# Patient Record
Sex: Female | Born: 1955 | Race: White | Hispanic: No | Marital: Married | Smoking: Never smoker
Health system: Southern US, Community
[De-identification: ages and names within clinical notes are randomized; demographics above are authoritative.]

## PROBLEM LIST (undated history)

## (undated) DIAGNOSIS — M1990 Unspecified osteoarthritis, unspecified site: Secondary | ICD-10-CM

## (undated) DIAGNOSIS — F419 Anxiety disorder, unspecified: Secondary | ICD-10-CM

## (undated) DIAGNOSIS — Z923 Personal history of irradiation: Secondary | ICD-10-CM

## (undated) DIAGNOSIS — G43909 Migraine, unspecified, not intractable, without status migrainosus: Secondary | ICD-10-CM

## (undated) DIAGNOSIS — Z808 Family history of malignant neoplasm of other organs or systems: Secondary | ICD-10-CM

## (undated) DIAGNOSIS — J309 Allergic rhinitis, unspecified: Secondary | ICD-10-CM

## (undated) DIAGNOSIS — R569 Unspecified convulsions: Secondary | ICD-10-CM

## (undated) DIAGNOSIS — Z801 Family history of malignant neoplasm of trachea, bronchus and lung: Secondary | ICD-10-CM

## (undated) DIAGNOSIS — I10 Essential (primary) hypertension: Secondary | ICD-10-CM

## (undated) DIAGNOSIS — Z9221 Personal history of antineoplastic chemotherapy: Secondary | ICD-10-CM

## (undated) DIAGNOSIS — T7840XA Allergy, unspecified, initial encounter: Secondary | ICD-10-CM

## (undated) DIAGNOSIS — L659 Nonscarring hair loss, unspecified: Secondary | ICD-10-CM

## (undated) DIAGNOSIS — L409 Psoriasis, unspecified: Secondary | ICD-10-CM

## (undated) DIAGNOSIS — F329 Major depressive disorder, single episode, unspecified: Secondary | ICD-10-CM

## (undated) DIAGNOSIS — G40909 Epilepsy, unspecified, not intractable, without status epilepticus: Secondary | ICD-10-CM

## (undated) DIAGNOSIS — E559 Vitamin D deficiency, unspecified: Secondary | ICD-10-CM

## (undated) HISTORY — PX: TONSILLECTOMY: SUR1361

## (undated) HISTORY — PX: KNEE SURGERY: SHX244

## (undated) HISTORY — DX: Anxiety disorder, unspecified: F41.9

## (undated) HISTORY — DX: Allergic rhinitis, unspecified: J30.9

## (undated) HISTORY — DX: Family history of malignant neoplasm of trachea, bronchus and lung: Z80.1

## (undated) HISTORY — DX: Nonscarring hair loss, unspecified: L65.9

## (undated) HISTORY — DX: Epilepsy, unspecified, not intractable, without status epilepticus: G40.909

## (undated) HISTORY — DX: Essential (primary) hypertension: I10

## (undated) HISTORY — DX: Unspecified convulsions: R56.9

## (undated) HISTORY — PX: BREAST LUMPECTOMY: SHX2

## (undated) HISTORY — DX: Family history of malignant neoplasm of other organs or systems: Z80.8

## (undated) HISTORY — DX: Major depressive disorder, single episode, unspecified: F32.9

## (undated) HISTORY — DX: Vitamin D deficiency, unspecified: E55.9

## (undated) HISTORY — DX: Migraine, unspecified, not intractable, without status migrainosus: G43.909

## (undated) HISTORY — PX: TUBAL LIGATION: SHX77

## (undated) HISTORY — PX: CHOLECYSTECTOMY: SHX55

## (undated) HISTORY — DX: Psoriasis, unspecified: L40.9

## (undated) HISTORY — DX: Unspecified osteoarthritis, unspecified site: M19.90

## (undated) HISTORY — DX: Allergy, unspecified, initial encounter: T78.40XA

## (undated) HISTORY — PX: BREAST BIOPSY: SHX20

---

## 2004-01-22 ENCOUNTER — Ambulatory Visit: Payer: Self-pay | Admitting: Obstetrics and Gynecology

## 2004-01-25 ENCOUNTER — Ambulatory Visit: Payer: Self-pay | Admitting: Obstetrics and Gynecology

## 2004-02-12 ENCOUNTER — Ambulatory Visit: Payer: Self-pay | Admitting: General Surgery

## 2004-10-02 ENCOUNTER — Other Ambulatory Visit: Payer: Self-pay

## 2004-10-02 ENCOUNTER — Emergency Department: Payer: Self-pay | Admitting: Internal Medicine

## 2005-02-24 ENCOUNTER — Ambulatory Visit: Payer: Self-pay | Admitting: Obstetrics and Gynecology

## 2005-11-17 ENCOUNTER — Ambulatory Visit: Payer: Self-pay | Admitting: Family Medicine

## 2006-03-17 ENCOUNTER — Ambulatory Visit: Payer: Self-pay | Admitting: Family Medicine

## 2007-10-06 ENCOUNTER — Ambulatory Visit: Payer: Self-pay | Admitting: Obstetrics and Gynecology

## 2010-02-20 ENCOUNTER — Ambulatory Visit: Payer: Self-pay | Admitting: Obstetrics and Gynecology

## 2011-06-08 ENCOUNTER — Emergency Department: Payer: Self-pay | Admitting: Emergency Medicine

## 2011-06-08 LAB — COMPREHENSIVE METABOLIC PANEL
Alkaline Phosphatase: 87 U/L (ref 50–136)
Anion Gap: 12 (ref 7–16)
Chloride: 101 mmol/L (ref 98–107)
Co2: 29 mmol/L (ref 21–32)
Creatinine: 0.82 mg/dL (ref 0.60–1.30)
EGFR (African American): >60
EGFR (Non-African Amer.): >60
Glucose: 110 mg/dL — ABNORMAL HIGH (ref 65–99)
Osmolality: 284 (ref 275–301)
SGOT(AST): 23 U/L (ref 15–37)
SGPT (ALT): 33 U/L
Sodium: 142 mmol/L (ref 136–145)
Total Protein: 8.5 g/dL — ABNORMAL HIGH (ref 6.4–8.2)

## 2011-06-08 LAB — CBC
HCT: 42.1 % (ref 35.0–47.0)
HGB: 13.9 g/dL (ref 12.0–16.0)
MCH: 29.9 pg (ref 26.0–34.0)
MCV: 90 fL (ref 80–100)
Platelet: 264 10*3/uL (ref 150–440)
RBC: 4.66 10*6/uL (ref 3.80–5.20)
RDW: 13.6 % (ref 11.5–14.5)
WBC: 6.1 10*3/uL (ref 3.6–11.0)

## 2011-06-08 LAB — TROPONIN I: Troponin-I: <0.02 ng/mL

## 2011-06-19 ENCOUNTER — Ambulatory Visit: Payer: Self-pay | Admitting: Obstetrics and Gynecology

## 2012-07-08 ENCOUNTER — Telehealth: Payer: Self-pay | Admitting: Diagnostic Neuroimaging

## 2012-07-08 NOTE — Telephone Encounter (Signed)
Pt called and is asking for an sooner appt then July due to will run out of her lamictal before then.  Former Dr. Sandria Manly pt.  (seen last 06-2010)

## 2012-08-10 ENCOUNTER — Telehealth: Payer: Self-pay | Admitting: Neurology

## 2012-08-10 NOTE — Telephone Encounter (Signed)
Calling to reschedule patient for Dr. Terrace Arabia.  Patient is scheduled for 10/24/2012 - home phone recording states number has been d/connected

## 2012-08-11 ENCOUNTER — Encounter: Payer: Self-pay | Admitting: *Deleted

## 2012-09-15 ENCOUNTER — Telehealth: Payer: Self-pay | Admitting: Diagnostic Neuroimaging

## 2012-10-13 ENCOUNTER — Other Ambulatory Visit: Payer: Self-pay

## 2012-10-13 MED ORDER — LAMOTRIGINE ER 100 MG PO TB24: ORAL_TABLET | ORAL | Status: DC

## 2012-10-14 ENCOUNTER — Ambulatory Visit: Payer: Self-pay | Admitting: Diagnostic Neuroimaging

## 2012-10-21 ENCOUNTER — Telehealth: Payer: Self-pay | Admitting: Diagnostic Neuroimaging

## 2012-10-21 MED ORDER — LAMICTAL XR 100 MG PO TB24: ORAL_TABLET | ORAL | Status: DC

## 2012-10-21 NOTE — Telephone Encounter (Signed)
Anna Stewart's husband called about her medication Lamitical, they received the generic brand in the mail, and he said she should be receiving the brand name. Please send in a order of name brand medication to [facility],. Patient only has 4 days left of  Med. Please advise

## 2012-10-31 ENCOUNTER — Encounter: Payer: Self-pay | Admitting: Diagnostic Neuroimaging

## 2012-10-31 ENCOUNTER — Ambulatory Visit (INDEPENDENT_AMBULATORY_CARE_PROVIDER_SITE_OTHER): Payer: 59 | Admitting: Diagnostic Neuroimaging

## 2012-10-31 VITALS — BP 118/82 | HR 83 | Temp 97.9°F | Ht 68.0 in | Wt 182.0 lb

## 2012-10-31 DIAGNOSIS — G40109 Localization-related (focal) (partial) symptomatic epilepsy and epileptic syndromes with simple partial seizures, not intractable, without status epilepticus: Secondary | ICD-10-CM

## 2012-10-31 MED ORDER — LAMICTAL XR 100 MG PO TB24: 200.0000 mg | ORAL_TABLET | Freq: Two times a day (BID) | ORAL | Status: DC

## 2012-10-31 NOTE — Patient Instructions (Signed)
Change lamictal xr to 200mg  twice a day.

## 2012-10-31 NOTE — Progress Notes (Signed)
[facility]  PATIENT: Anna Stewart DOB: October 10, 1955  REFERRING CLINICIAN:  HISTORY FROM: patient and husband REASON FOR VISIT: follow up   HISTORICAL  CHIEF COMPLAINT:  Chief Complaint  Patient presents with  . Follow-up    Dr. Sandria Manly transfer  . Seizures    HISTORY OF PRESENT ILLNESS:   UPDATE 10/31/12: Had breakthrough sz in May 2014 (1), June 2014 (3) and July 2014 (1). Previous to this patient has had no seizures in the prior one year. Patient seizures are similar quality consisting of stomach uprising sensation, dj vu, inability to speak for 20 seconds. No significant postictal confusion with the recent seizures. 09/09/2012 seizure cluster may have been related to her menstrual cycle. In May 2014 episode may have been triggered by missed dose of medication.  Patient last seen by Dr. love in 2012. At one point she was on regular release Lamictal, then transition to Lamictal XR 200 mg twice a day. She had some numbness in her left chin and therefore was reduced to Lamictal XR 200 mg the morning and 100 mg daily.   PRIOR HPI (07/02/10, Dr. 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 There was no loss of consciousness. In her 30s she began having recurrent episodes of a sensation of something beginning in her abdomen, rolling up to her mid chest associated with dj vu. Some episodes are associated with confusion. On one occasion she was eating a meal with a friend who felt the patient was "speaking in tongue." She describes these as "here it comes".  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 She describes episodes of dj vu and difficulty speaking. The episodes all last less than one minute.  She was started on Keppra but developed elevated liver function tests and  a "bone  marrow problem".  She was placed on Tegretol but developed a rash. She was tried on phenobarbital. She was placed on Dilantin and built up on brand name medically necessary to twice daily but this causes sleepiness so she takes one in the morning and 3 at night. At times she has problems getting her words out and has slurred speech. Her seizures occur at least 4 times per year. it is not clear if she loses consciousness with any of these. She has auras of dj vu at least once a month. She has never had a generalized major motor seizure. 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  She says "I'm klutzy" and tends to fall. She's noted numbness in her feet beginning over 2 years ago. She falls going upstairs.She has pain in her feet and uses ice pads at night. She has thought this may be "plantar fasciitis". She indicated this 01/11/2009 at [facility] and was evaluated with CBC, BMP, hemoglobin A1c, TSH free T4, B12, and B6, but I cannot tell if it was ever drawn.Dr. Suzie Portela has documented elevated phenytoin levels of  25.2, 11/09/08 and 30.3, 08/21/09. Previous levels were 24 and 27 in 2007  and 23.5 in 2009.   5/6/2011she had a low vitamin D level of 16.9 with normal CBC and CMP except alkaline phosphatase 159. TSH was 3.45. She is independent in her activities of daily living and drives. 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 9/16/2011was normal homocystine, methylmalonic acid, anti-CCP antibodies. Bone density study 12/18/2009 revealed T -1.5 She started on Lamictal 12/13/2009 and was tapered off of Dilantin 05/2010. She has had 2 seizures over the last few weeks, occurring 3/7 and 3/16. A Lamictal level was 3.1  2/15/212. She is now on Lamictal XR 200 mg twice  daily.This weekend Friday she noted difficulty walking.  She saiid while holding her head up. She is noted wobbly gait. She's had double vision. She comes in today for evaluation.Her lamictal was held yesterday and this morning. She has noted improvement in the numbness in her feet since discontinuing Dilantin  REVIEW OF SYSTEMS: Full 14 system review of systems performed and notable only for seizure.  ALLERGIES: Allergies  Allergen Reactions  . Tegretol (Carbamazepine)     HOME MEDICATIONS: Outpatient Prescriptions Prior to Visit  Medication Sig Dispense Refill  . LAMICTAL XR 100 MG TB24 Two tabs po in am and one tab po in pm  270 tablet  0   No facility-administered medications prior to visit.    PAST MEDICAL HISTORY: Past Medical History  Diagnosis Date  . Hypertension     x 5years  . Seizure     x 17years  . Migraine     PAST SURGICAL HISTORY: Past Surgical History  Procedure Laterality Date  . Knee surgery    . Cesarean section    . Cholecystectomy      FAMILY HISTORY: Family History  Problem Relation Age of Onset  . Brain cancer Mother     tumor  . Diabetes Father   . Cancer Father   . Heart disease Father     SOCIAL HISTORY:  History   Social History  . Marital Status: Married    Spouse Name: Acupuncturist    Number of Children: 3  . Years of Education: College   Occupational History  .   Other    [employer]   Social History Main Topics  . Smoking status: Never Smoker   . Smokeless tobacco: Never Used  . Alcohol Use: No     Comment: Rarely- wine  . Drug Use: No  . Sexually Active: Not on file   Other Topics Concern  . Not on file   Social History Narrative   Patient lives at home with her family.   Caffeine Use: 1 cups of coffee daily     PHYSICAL EXAM  Filed Vitals:   10/31/12 1055  BP: 118/82  Pulse: 83  Temp: 97.9 F (36.6 C)  TempSrc: Oral  Height: 5\' 8"  (1.727 m)  Weight: 182 lb (82.555 kg)    Not recorded      Body mass index is 27.68 kg/(m^2).  GENERAL EXAM: Patient is in no distress  CARDIOVASCULAR: Regular rate and rhythm, no murmurs, no carotid bruits  NEUROLOGIC: MENTAL STATUS: awake, alert, language fluent, comprehension intact, naming intact CRANIAL NERVE: pupils equal and reactive to light, visual fields full to confrontation, extraocular muscles intact, no nystagmus, facial sensation and strength symmetric, uvula midline, shoulder shrug symmetric, tongue midline. MOTOR: normal bulk and tone, full strength in the BUE, BLE SENSORY: normal and symmetric to light touch COORDINATION: finger-nose-finger, fine finger movements normal REFLEXES: deep tendon reflexes present and symmetric GAIT/STATION: narrow based gait; able to walk tandem   DIAGNOSTIC DATA (LABS, IMAGING, TESTING) -  I reviewed patient records, labs, notes, testing and imaging myself where available.  No results found for this basename: WBC, HGB, HCT, MCV, PLT   No results found for this basename: na, k, cl, co2, glucose, bun, creatinine, calcium, prot, albumin, ast, alt, alkphos, bilitot, gfrnonaa, gfraa   No results found for this basename: CHOL, HDL, LDLCALC, LDLDIRECT, TRIG, CHOLHDL   No results found for this basename: HGBA1C   No results found for this basename: VITAMINB12   No results found for this basename: TSH      ASSESSMENT AND PLAN  57 y.o. year old female  has a past medical history of Hypertension; Seizure; and Migraine. here with seizure disorder. Breakthrough seizures in May -July 2014 noted. Will increase dosing.  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 incr Lamitcal XR (brand necessary) to 200mg  BID   Suanne Marker, MD 10/31/2012, 12:00 PM Certified in Neurology, Neurophysiology and Neuroimaging  [facility] [address] [place], [place] [address] [phone]

## 2012-11-18 ENCOUNTER — Ambulatory Visit: Payer: Self-pay | Admitting: Unknown Physician Specialty

## 2012-12-08 ENCOUNTER — Telehealth: Payer: Self-pay

## 2012-12-08 MED ORDER — LAMICTAL XR 200 MG PO TB24: 200.0000 mg | ORAL_TABLET | Freq: Two times a day (BID) | ORAL | Status: AC

## 2012-12-08 NOTE — Telephone Encounter (Signed)
[facility] sent [place] a letter saying they will not pay for 4 tablets of the Lamictal XR Daily.  They did, however, indicate they would pay for two 200mg  tabs daily.  They would like the Rx updated to one 200mg  tab bid, otherwise, the patient will have to pay out of pocket for meds.  Since it equals the same dose, I have updated the Rx and resent it to the pharmacy.

## 2012-12-19 ENCOUNTER — Telehealth: Payer: Self-pay | Admitting: Diagnostic Neuroimaging

## 2012-12-19 NOTE — Telephone Encounter (Signed)
Patient says she had a breakthrough sz on Sunday. She is unable to get Brand sz med (Lamictal) through pharmacy ([facility].) Would like Dr. Marjory Lies to advise.

## 2012-12-22 ENCOUNTER — Telehealth: Payer: Self-pay | Admitting: Diagnostic Neuroimaging

## 2012-12-22 NOTE — Telephone Encounter (Signed)
We sent a Rx to the pharmacy on 12/08/2012 for Brand Name Only.  I called the pharmacy.  Spoke with Carley Hammed. She said her records do reflect they have the Rx for BMN, but have not processed the order.  She did run the Rx through ins and said it will pay for Brand, however they will not ship meds until they speak to the patient.  She states they will contact the patient with the next 24 hours regarding shipment of this medication.  Once they verify all info required, meds will ship.

## 2013-01-06 NOTE — Telephone Encounter (Signed)
Setup revisit with Lynn. -VRP 

## 2013-01-09 NOTE — Telephone Encounter (Signed)
Lvm for patient to call me back to make a sooner appointment . With Dr. Marjory Lies NP Heide Guile.

## 2013-01-10 ENCOUNTER — Telehealth: Payer: Self-pay | Admitting: Nurse Practitioner

## 2013-01-10 ENCOUNTER — Telehealth: Payer: Self-pay | Admitting: Diagnostic Neuroimaging

## 2013-01-10 NOTE — Telephone Encounter (Signed)
Call pt to schedule a earlier appt. For her, left message to call back to schedule with lynn lam, NP

## 2013-01-12 NOTE — Telephone Encounter (Signed)
Returned call. No answer.  

## 2013-04-28 ENCOUNTER — Ambulatory Visit: Payer: 59 | Admitting: Diagnostic Neuroimaging

## 2013-06-27 ENCOUNTER — Ambulatory Visit: Payer: 59 | Admitting: Diagnostic Neuroimaging

## 2013-07-10 DIAGNOSIS — N83209 Unspecified ovarian cyst, unspecified side: Secondary | ICD-10-CM | POA: Insufficient documentation

## 2013-07-10 DIAGNOSIS — M722 Plantar fascial fibromatosis: Secondary | ICD-10-CM | POA: Insufficient documentation

## 2013-07-10 DIAGNOSIS — R569 Unspecified convulsions: Secondary | ICD-10-CM | POA: Insufficient documentation

## 2013-08-30 DIAGNOSIS — F32A Depression, unspecified: Secondary | ICD-10-CM | POA: Insufficient documentation

## 2013-08-30 DIAGNOSIS — E538 Deficiency of other specified B group vitamins: Secondary | ICD-10-CM | POA: Insufficient documentation

## 2013-08-30 DIAGNOSIS — F329 Major depressive disorder, single episode, unspecified: Secondary | ICD-10-CM | POA: Insufficient documentation

## 2014-02-19 ENCOUNTER — Ambulatory Visit: Payer: Self-pay | Admitting: Obstetrics and Gynecology

## 2014-02-19 LAB — HM MAMMOGRAPHY

## 2014-10-12 DIAGNOSIS — I10 Essential (primary) hypertension: Secondary | ICD-10-CM | POA: Insufficient documentation

## 2014-10-17 ENCOUNTER — Encounter: Payer: Self-pay | Admitting: Obstetrics and Gynecology

## 2015-05-14 ENCOUNTER — Ambulatory Visit (INDEPENDENT_AMBULATORY_CARE_PROVIDER_SITE_OTHER): Payer: 59 | Admitting: Obstetrics and Gynecology

## 2015-05-14 ENCOUNTER — Encounter: Payer: Self-pay | Admitting: Obstetrics and Gynecology

## 2015-05-14 VITALS — BP 107/71 | HR 84 | Ht 68.0 in | Wt 169.5 lb

## 2015-05-14 DIAGNOSIS — Z780 Asymptomatic menopausal state: Secondary | ICD-10-CM | POA: Insufficient documentation

## 2015-05-14 DIAGNOSIS — N952 Postmenopausal atrophic vaginitis: Secondary | ICD-10-CM | POA: Diagnosis not present

## 2015-05-14 DIAGNOSIS — G43909 Migraine, unspecified, not intractable, without status migrainosus: Secondary | ICD-10-CM | POA: Insufficient documentation

## 2015-05-14 DIAGNOSIS — Z01419 Encounter for gynecological examination (general) (routine) without abnormal findings: Secondary | ICD-10-CM | POA: Diagnosis not present

## 2015-05-14 DIAGNOSIS — Z1211 Encounter for screening for malignant neoplasm of colon: Secondary | ICD-10-CM

## 2015-05-14 DIAGNOSIS — Z1239 Encounter for other screening for malignant neoplasm of breast: Secondary | ICD-10-CM

## 2015-05-14 MED ORDER — ESTROGENS, CONJUGATED 0.625 MG/GM VA CREA: 0.2500 | TOPICAL_CREAM | Freq: Every day | VAGINAL | Status: DC

## 2015-05-14 NOTE — Patient Instructions (Signed)
1.  Pap smear is done today. 2.  Mammogram is ordered. 3.  Stool guaiac cards for colon cancer screening are given. 4.  Continue with screening laboratory testing with primary care. 5.  Resume Premarin cream intravaginally twice a week. 6.  Encourage healthy eating, exercise, maintaining weight, minimizing alcohol use to 1 drink a day or less. 7.  Encourage calcium with vitamin D supplementation. 8.  Return in 1 year

## 2015-05-14 NOTE — Progress Notes (Signed)
Patient ID: Anna Stewart, female   DOB: Feb 05, 1956, 60 y.o.   MRN: [id number] ANNUAL PREVENTATIVE CARE GYN  ENCOUNTER NOTE  Subjective:       Anna Stewart is a 60 y.o. [id number] female here for a routine annual gynecologic exam.  Current complaints: 1.  Mild vasomotor symptoms 2.  Mild arthritic changes in knees, hands and fingers. 3.  Vaginal dryness.    Gynecologic History No LMP recorded. Patient is postmenopausal. Contraception: post menopausal status Last Pap: 2014. Results were: normal Last mammogram: 2016. Results were: normal No HRT therapy Previously used Premarin cream intravaginal  Obstetric History OB History  Gravida Para Term Preterm AB SAB TAB Ectopic Multiple Living  3 3 3       3     # Outcome Date GA Lbr Len/2nd Weight Sex Delivery Anes PTL Lv  3 Term      CS-LTranv   Y  2 Term      CS-LTranv   Y  1 Term      CS-LTranv   Y      Past Medical History  Diagnosis Date  . Hypertension     x 5years  . Seizure ([place])     x 17years  . Migraine   . Hair loss   . Anxiety   . Depression   . Psoriasis   . Seizure disorder ([place])   . Vitamin D deficiency   . AR (allergic rhinitis)     Past Surgical History  Procedure Laterality Date  . Knee surgery    . Cesarean section    . Cholecystectomy    . Breast lumpectomy    . Knee surgery    . Tubal ligation    . Tonsillectomy      Current Outpatient Prescriptions on File Prior to Visit  Medication Sig Dispense Refill  . calcium carbonate (OS-CAL) 600 MG TABS Take 600 mg by mouth 2 (two) times daily with a meal.    . cholecalciferol (VITAMIN D) 1000 UNITS tablet Take 1,000 Units by mouth daily.    Marland Kitchen LAMICTAL XR 200 MG TB24 Take 1 tablet (200 mg total) by mouth 2 (two) times daily. Brand Medically Necessary 180 tablet 3  . lisinopril-hydrochlorothiazide (PRINZIDE,ZESTORETIC) 10-12.5 MG per tablet Take 1 tablet by mouth daily.     No current facility-administered medications on file prior to  visit.    Allergies  Allergen Reactions  . Keppra [Levetiracetam]   . Tegretol [Carbamazepine]     Social History   Social History  . Marital Status: Married    Spouse Name: Event organiser  . Number of Children: 3  . Years of Education: College   Occupational History  .   Other    [employer]   Social History Main Topics  . Smoking status: Never Smoker   . Smokeless tobacco: Never Used  . Alcohol Use: Yes     Comment: Rarely- wine  . Drug Use: No  . Sexual Activity: Yes    Birth Control/ Protection: Post-menopausal   Other Topics Concern  . Not on file   Social History Narrative   Patient lives at home with her family.   Caffeine Use: 1 cups of coffee daily    Family History  Problem Relation Age of Onset  . Brain cancer Mother     tumor  . Diabetes Father   . Cancer Father   . Heart disease Father   . Lung cancer Father     The  following portions of the patient's history were reviewed and updated as appropriate: allergies, current medications, past family history, past medical history, past social history, past surgical history and problem list.  Review of Systems ROS Review of Systems - General ROS: negative for - chills, fatigue, fever, hot flashes, night sweats, weight gain or weight loss Psychological ROS: negative for - anxiety, decreased libido, depression, mood swings, physical abuse or sexual abuse Ophthalmic ROS: negative for - blurry vision, eye pain or loss of vision ENT ROS: negative for - headaches, hearing change, visual changes or vocal changes Allergy and Immunology ROS: negative for - hives, itchy/watery eyes or seasonal allergies Hematological and Lymphatic ROS: negative for - bleeding problems, bruising, swollen lymph nodes or weight loss Endocrine ROS: negative for - galactorrhea, hair pattern changes, hot flashes, malaise/lethargy, mood swings, palpitations, polydipsia/polyuria, skin changes, temperature intolerance or unexpected  weight changes Breast ROS: negative for - new or changing breast lumps or nipple discharge Respiratory ROS: negative for - cough or shortness of breath Cardiovascular ROS: negative for - chest pain, irregular heartbeat, palpitations or shortness of breath Gastrointestinal ROS: no abdominal pain, change in bowel habits, or black or bloody stools Genito-Urinary ROS: no dysuria, trouble voiding, or hematuria Musculoskeletal ROS: negative for - joint pain or joint stiffness Neurological ROS: negative for - bowel and bladder control changes Dermatological ROS: negative for rash and skin lesion changes   Objective:   BP 107/71 mmHg  Pulse 84  Ht 5\' 8"  (1.727 m)  Wt 169 lb 8 oz (76.885 kg)  BMI 25.78 kg/m2 CONSTITUTIONAL: Well-developed, well-nourished female in no acute distress.  PSYCHIATRIC: Normal mood and affect. Normal behavior. Normal judgment and thought content. [place]: Alert and oriented to person, place, and time. Normal muscle tone coordination. No cranial nerve deficit noted. HENT:  Normocephalic, atraumatic, External right and left ear normal. Oropharynx is clear and moist EYES: Conjunctivae and EOM are normal. Pupils are equal, round, and reactive to light. No scleral icterus.  NECK: Normal range of motion, supple, no masses.  Normal thyroid.  SKIN: Skin is warm and dry. No rash noted. Not diaphoretic. No erythema. No pallor. CARDIOVASCULAR: Normal heart rate noted, regular rhythm, no murmur. RESPIRATORY: Clear to auscultation bilaterally. Effort and breath sounds normal, no problems with respiration noted. BREASTS: Symmetric in size. No masses, skin changes, nipple drainage, or lymphadenopathy. ABDOMEN: Soft, normal bowel sounds, no distention noted.  No tenderness, rebound or guarding.  BLADDER: Normal PELVIC:  External Genitalia: Normal  BUS: Normal  Vagina: mild to moderate atrophy  Cervix: Normal  Uterus: Normal; midplane, normal size and shape, mobile  Adnexa:  Normal; nonpalpable and nontender  RV: External Exam NormaI, No Rectal Masses and Normal Sphincter tone  MUSCULOSKELETAL: Normal range of motion. No tenderness.  No cyanosis, clubbing, or edema.  2+ distal pulses. LYMPHATIC: No Axillary, Supraclavicular, or Inguinal Adenopathy.    Assessment:   Annual gynecologic examination 60 y.o. Contraception: post menopausal status bmi-25 Mild vasomotor symptoms, not desiring therapy. Vaginal atrophy, symptomatic  Plan:  Pap: Pap Co Test Mammogram: Ordered Stool Guaiac Testing:  Ordered Labs: thru employer Routine preventative health maintenance measures emphasized: Exercise/Diet/Weight control, Tobacco Warnings and Alcohol/Substance use risks Premarin cream 1 g intravaginally twice a week Return to [place], MD  Brayton Mars, MD  Note: This dictation was prepared with Dragon dictation along with smaller phrase technology. Any transcriptional errors that result from this process are unintentional.

## 2015-05-22 LAB — PAP IG AND HPV HIGH-RISK
HPV, HIGH-RISK: NEGATIVE
PAP Smear Comment: 0

## 2015-05-25 LAB — FECAL OCCULT BLOOD, IMMUNOCHEMICAL: Fecal Occult Bld: NEGATIVE

## 2015-08-28 ENCOUNTER — Other Ambulatory Visit: Payer: Self-pay | Admitting: Student

## 2015-08-28 DIAGNOSIS — M5416 Radiculopathy, lumbar region: Secondary | ICD-10-CM

## 2015-09-17 ENCOUNTER — Ambulatory Visit: Payer: 59

## 2015-11-26 ENCOUNTER — Encounter: Payer: Self-pay | Admitting: Emergency Medicine

## 2015-11-26 ENCOUNTER — Emergency Department
Admission: EM | Admit: 2015-11-26 | Discharge: 2015-11-27 | Disposition: A | Discharge status: Home / Self Care | Payer: 59 | Attending: Emergency Medicine | Admitting: Emergency Medicine

## 2015-11-26 DIAGNOSIS — Y92009 Unspecified place in unspecified non-institutional (private) residence as the place of occurrence of the external cause: Secondary | ICD-10-CM | POA: Insufficient documentation

## 2015-11-26 DIAGNOSIS — I10 Essential (primary) hypertension: Secondary | ICD-10-CM | POA: Diagnosis not present

## 2015-11-26 DIAGNOSIS — Y999 Unspecified external cause status: Secondary | ICD-10-CM | POA: Insufficient documentation

## 2015-11-26 DIAGNOSIS — S91011A Laceration without foreign body, right ankle, initial encounter: Secondary | ICD-10-CM | POA: Diagnosis not present

## 2015-11-26 DIAGNOSIS — W231XXA Caught, crushed, jammed, or pinched between stationary objects, initial encounter: Secondary | ICD-10-CM | POA: Insufficient documentation

## 2015-11-26 DIAGNOSIS — Z5189 Encounter for other specified aftercare: Secondary | ICD-10-CM

## 2015-11-26 DIAGNOSIS — IMO0002 Reserved for concepts with insufficient information to code with codable children: Secondary | ICD-10-CM

## 2015-11-26 DIAGNOSIS — Y939 Activity, unspecified: Secondary | ICD-10-CM | POA: Insufficient documentation

## 2015-11-26 NOTE — ED Triage Notes (Addendum)
Pt presents to ED with laceration to her right ankle. Pt states the storm door in her home closed before her leg got through the opening and the corner of the door caught her ankle. Laceration noted. No bleeding at this time.

## 2015-11-27 MED ORDER — CEPHALEXIN 500 MG PO CAPS: 500.0000 mg | ORAL_CAPSULE | Freq: Three times a day (TID) | ORAL | 0 refills | Status: DC

## 2015-11-27 MED ORDER — CEPHALEXIN 500 MG PO CAPS
500.0000 mg | ORAL_CAPSULE | Freq: Once | ORAL | Status: AC
Start: 2015-11-27 — End: 2015-11-27
  Administered 2015-11-27: 500 mg via ORAL
  Filled 2015-11-27: qty 1

## 2015-11-27 NOTE — ED Provider Notes (Signed)
[facility] Emergency Department Provider Note   ____________________________________________   First MD Initiated Contact with Patient 11/26/15 2346     (approximate)  I have reviewed the triage vital signs and the nursing notes.   HISTORY  Chief Complaint Laceration    HPI Anna Stewart is a 60 y.o. female who presents to the ED from home with a chief complaint of right ankle laceration. Patient reports approximately 6 PM her ankle got caught in the bottom corner of the storm door. She presents with nonbleeding laceration. Tetanus is up-to-date. Denies fever, chills, chest pain, shortness of breath, abdominal pain, nausea, vomiting, diarrhea. Denies recent travel or trauma. Nothing makes her symptoms better or worse.   Past Medical History:  Diagnosis Date  . Anxiety   . AR (allergic rhinitis)   . Depression   . Hair loss   . Hypertension    x 5years  . Migraine   . Psoriasis   . Seizure ([place])    x 17years  . Seizure disorder ([place])   . Vitamin D deficiency     Patient Active Problem List   Diagnosis Date Noted  . Vaginal atrophy 05/14/2015  . Menopause 05/14/2015  . Headache, migraine 05/14/2015  . Benign essential HTN 10/12/2014  . B12 deficiency 08/30/2013  . Clinical depression 08/30/2013  . Seizure ([place]) 07/10/2013  . Dupuytren's contracture of foot 07/10/2013  . 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 BREAST LUMPECTOMY    . CESAREAN SECTION    . CHOLECYSTECTOMY    . KNEE SURGERY    . KNEE SURGERY    . TONSILLECTOMY    . TUBAL LIGATION      Prior to Admission medications   Medication Sig Start Date End Date Taking? Authorizing Provider  calcium carbonate (OS-CAL) 600 MG TABS Take 600 mg by mouth 2 (two) times daily with a meal.    Historical Provider, MD  cephALEXin (KEFLEX) 500 MG capsule Take 1 capsule (500 mg total) by mouth 3 (three) times daily.  11/27/15   Paulette Blanch, MD  cholecalciferol (VITAMIN D) 1000 UNITS tablet Take 1,000 Units by mouth daily.    Historical Provider, MD  conjugated estrogens (PREMARIN) vaginal cream Place [id number] Applicatorfuls vaginally daily. 1/2 gram twice weekly. 05/14/15   Alanda Slim Defrancesco, MD  fluticasone (FLONASE) 50 MCG/ACT nasal spray Place into the nose. 02/26/15 02/26/16  Historical Provider, MD  LAMICTAL XR 200 MG TB24 Take 1 tablet (200 mg total) by mouth 2 (two) times daily. Brand Medically Necessary 12/08/12   Penni Bombard, MD  lisinopril-hydrochlorothiazide (PRINZIDE,ZESTORETIC) 10-12.5 MG per tablet Take 1 tablet by mouth daily. 08/11/12   Historical Provider, MD  Multiple Vitamin (MULTI-VITAMINS) TABS Take by mouth.    Historical Provider, MD    Allergies Keppra [levetiracetam] and Tegretol [carbamazepine]  Family History  Problem Relation Age of Onset  . Brain cancer Mother     tumor  . Diabetes Father   . Cancer Father   . Heart disease Father   . Lung cancer Father     Social History Social History  Substance Use Topics  . Smoking status: Never Smoker  . Smokeless tobacco: Never Used  . Alcohol use Yes     Comment: Rarely- wine    Review of Systems  Constitutional: No fever/chills. Eyes: No visual changes. ENT: No sore throat. Cardiovascular: Denies chest pain. Respiratory: Denies shortness of breath. Gastrointestinal: No abdominal  pain.  No nausea, no vomiting.  No diarrhea.  No constipation. Genitourinary: Negative for dysuria. Musculoskeletal: Positive for right ankle laceration. Skin: Negative for rash. Neurological: Negative for headaches, focal weakness or numbness.  10-point ROS otherwise negative.  ____________________________________________   PHYSICAL EXAM:  VITAL SIGNS: ED Triage Vitals  Enc Vitals Group     BP 11/26/15 2051 133/77     Pulse Rate 11/26/15 2051 84     Resp 11/26/15 2051 20     Temp 11/26/15 2051 98.1 F (36.7 C)     Temp Source  11/26/15 2051 Oral     SpO2 11/26/15 2051 97 %     Weight 11/26/15 2051 166 lb (75.3 kg)     Height 11/26/15 2051 5\' 8"  (1.727 m)     Head Circumference --      Peak Flow --      Pain Score 11/26/15 2052 5     Pain Loc --      Pain Edu? --      Excl. in [place]? --     Constitutional: Alert and oriented. Well appearing and in no acute distress. Eyes: Conjunctivae are normal. PERRL. EOMI. Head: Atraumatic. Nose: No congestion/rhinnorhea. Mouth/Throat: Mucous membranes are moist.  Oropharynx non-erythematous. Neck: No stridor.   Cardiovascular: Normal rate, regular rhythm. Grossly normal heart sounds.  Good peripheral circulation. Respiratory: Normal respiratory effort.  No retractions. Lungs CTAB. Gastrointestinal: Soft and nontender. No distention. No abdominal bruits. No CVA tenderness. Musculoskeletal: Right lateral ankle with approximately 2 cm superficial avulsion type wound without active bleeding. Skin margins do not stretch and cannot be approximated. 2+ distal pulses. Full range of motion without pain. No tendon injury seen. Neurologic:  Normal speech and language. No gross focal neurologic deficits are appreciated. No gait instability. Skin:  Skin is warm, dry and intact. No rash noted. Psychiatric: Mood and affect are normal. Speech and behavior are normal.  ____________________________________________   LABS (all labs ordered are listed, but only abnormal results are displayed)  Labs Reviewed - No data to display ____________________________________________  EKG  None ____________________________________________  RADIOLOGY  None ____________________________________________   PROCEDURES  Procedure(s) performed: None  Procedures  Critical Care performed: No  ____________________________________________   INITIAL IMPRESSION / ASSESSMENT AND PLAN / ED COURSE  Pertinent labs & imaging results that were available during my care of the patient were reviewed by  me and considered in my medical decision making (see chart for details).  60 year old female who presents approximately 6 hours after laceration of her right ankle. Patient has a avulsion type wound which is not amenable to suture repair. Nursing to irrigate wound, cleanse and apply nonadherent dressing. Strict return precautions given. Patient and spouse verbalize understanding and agree with plan of care.  Clinical Course     ____________________________________________   FINAL CLINICAL IMPRESSION(S) / ED DIAGNOSES  Final diagnoses:  Laceration  Encounter for wound care      NEW MEDICATIONS STARTED DURING THIS VISIT:  New Prescriptions   CEPHALEXIN (KEFLEX) 500 MG CAPSULE    Take 1 capsule (500 mg total) by mouth 3 (three) times daily.     Note:  This document was prepared using Dragon voice recognition software and may include unintentional dictation errors.    Paulette Blanch, MD 11/27/15 [phone]

## 2015-11-27 NOTE — Discharge Instructions (Signed)
1. Take antibiotic as prescribed (Keflex 500 mg 3 times daily 7 days). 2. Keep wound clean and dry. 3. Return to the ER for worsening symptoms, increased redness/swelling, purulent discharge or other concerns.

## 2016-03-16 DIAGNOSIS — Z8659 Personal history of other mental and behavioral disorders: Secondary | ICD-10-CM | POA: Insufficient documentation

## 2016-03-16 DIAGNOSIS — M79672 Pain in left foot: Secondary | ICD-10-CM | POA: Insufficient documentation

## 2016-05-18 DIAGNOSIS — M25561 Pain in right knee: Secondary | ICD-10-CM | POA: Diagnosis not present

## 2016-05-18 DIAGNOSIS — M170 Bilateral primary osteoarthritis of knee: Secondary | ICD-10-CM | POA: Diagnosis not present

## 2016-05-18 DIAGNOSIS — M25562 Pain in left knee: Secondary | ICD-10-CM | POA: Diagnosis not present

## 2016-05-25 DIAGNOSIS — M25562 Pain in left knee: Secondary | ICD-10-CM | POA: Diagnosis not present

## 2016-05-25 DIAGNOSIS — M25561 Pain in right knee: Secondary | ICD-10-CM | POA: Diagnosis not present

## 2016-05-25 DIAGNOSIS — M170 Bilateral primary osteoarthritis of knee: Secondary | ICD-10-CM | POA: Diagnosis not present

## 2016-06-01 DIAGNOSIS — M25562 Pain in left knee: Secondary | ICD-10-CM | POA: Diagnosis not present

## 2016-06-01 DIAGNOSIS — M25561 Pain in right knee: Secondary | ICD-10-CM | POA: Diagnosis not present

## 2016-06-01 DIAGNOSIS — M170 Bilateral primary osteoarthritis of knee: Secondary | ICD-10-CM | POA: Diagnosis not present

## 2016-11-09 ENCOUNTER — Other Ambulatory Visit: Payer: Self-pay | Admitting: Family Medicine

## 2016-11-09 ENCOUNTER — Other Ambulatory Visit: Payer: Self-pay | Admitting: Obstetrics and Gynecology

## 2016-11-09 DIAGNOSIS — Z1231 Encounter for screening mammogram for malignant neoplasm of breast: Secondary | ICD-10-CM

## 2016-11-24 ENCOUNTER — Ambulatory Visit
Admission: RE | Admit: 2016-11-24 | Discharge: 2016-11-24 | Disposition: A | Discharge status: Home / Self Care | Payer: 59 | Source: Ambulatory Visit | Attending: Family Medicine | Admitting: Family Medicine

## 2016-11-24 DIAGNOSIS — Z1231 Encounter for screening mammogram for malignant neoplasm of breast: Secondary | ICD-10-CM | POA: Diagnosis not present

## 2017-01-19 DIAGNOSIS — M25512 Pain in left shoulder: Secondary | ICD-10-CM | POA: Diagnosis not present

## 2017-03-27 DIAGNOSIS — J019 Acute sinusitis, unspecified: Secondary | ICD-10-CM | POA: Diagnosis not present

## 2017-04-12 DIAGNOSIS — M21162 Varus deformity, not elsewhere classified, left knee: Secondary | ICD-10-CM | POA: Diagnosis not present

## 2017-04-12 DIAGNOSIS — M25561 Pain in right knee: Secondary | ICD-10-CM | POA: Diagnosis not present

## 2017-04-12 DIAGNOSIS — M21161 Varus deformity, not elsewhere classified, right knee: Secondary | ICD-10-CM | POA: Diagnosis not present

## 2017-04-12 DIAGNOSIS — M170 Bilateral primary osteoarthritis of knee: Secondary | ICD-10-CM | POA: Diagnosis not present

## 2017-04-12 DIAGNOSIS — M25562 Pain in left knee: Secondary | ICD-10-CM | POA: Diagnosis not present

## 2017-06-07 ENCOUNTER — Ambulatory Visit: Payer: 59 | Admitting: Cardiology

## 2017-06-29 ENCOUNTER — Ambulatory Visit: Payer: 59 | Admitting: Physician Assistant

## 2017-08-17 DIAGNOSIS — Z0000 Encounter for general adult medical examination without abnormal findings: Secondary | ICD-10-CM | POA: Diagnosis not present

## 2017-08-17 DIAGNOSIS — I10 Essential (primary) hypertension: Secondary | ICD-10-CM | POA: Diagnosis not present

## 2017-08-17 DIAGNOSIS — Z79899 Other long term (current) drug therapy: Secondary | ICD-10-CM | POA: Diagnosis not present

## 2017-08-23 DIAGNOSIS — M7651 Patellar tendinitis, right knee: Secondary | ICD-10-CM | POA: Diagnosis not present

## 2017-08-23 DIAGNOSIS — M170 Bilateral primary osteoarthritis of knee: Secondary | ICD-10-CM | POA: Diagnosis not present

## 2017-08-23 DIAGNOSIS — M25562 Pain in left knee: Secondary | ICD-10-CM | POA: Diagnosis not present

## 2017-08-23 DIAGNOSIS — M25561 Pain in right knee: Secondary | ICD-10-CM | POA: Diagnosis not present

## 2017-09-02 DIAGNOSIS — M170 Bilateral primary osteoarthritis of knee: Secondary | ICD-10-CM | POA: Diagnosis not present

## 2017-09-09 DIAGNOSIS — M170 Bilateral primary osteoarthritis of knee: Secondary | ICD-10-CM | POA: Diagnosis not present

## 2017-09-16 DIAGNOSIS — M170 Bilateral primary osteoarthritis of knee: Secondary | ICD-10-CM | POA: Diagnosis not present

## 2017-12-17 ENCOUNTER — Emergency Department
Admission: EM | Admit: 2017-12-17 | Discharge: 2017-12-17 | Disposition: A | Discharge status: Home / Self Care | Payer: 59 | Attending: Emergency Medicine | Admitting: Emergency Medicine

## 2017-12-17 ENCOUNTER — Other Ambulatory Visit: Payer: Self-pay

## 2017-12-17 ENCOUNTER — Emergency Department: Payer: 59

## 2017-12-17 ENCOUNTER — Encounter: Payer: Self-pay | Admitting: Emergency Medicine

## 2017-12-17 DIAGNOSIS — Z79899 Other long term (current) drug therapy: Secondary | ICD-10-CM | POA: Insufficient documentation

## 2017-12-17 DIAGNOSIS — F419 Anxiety disorder, unspecified: Secondary | ICD-10-CM | POA: Insufficient documentation

## 2017-12-17 DIAGNOSIS — R079 Chest pain, unspecified: Secondary | ICD-10-CM | POA: Insufficient documentation

## 2017-12-17 DIAGNOSIS — F329 Major depressive disorder, single episode, unspecified: Secondary | ICD-10-CM | POA: Insufficient documentation

## 2017-12-17 DIAGNOSIS — I10 Essential (primary) hypertension: Secondary | ICD-10-CM | POA: Diagnosis not present

## 2017-12-17 DIAGNOSIS — R0602 Shortness of breath: Secondary | ICD-10-CM | POA: Insufficient documentation

## 2017-12-17 DIAGNOSIS — Z9049 Acquired absence of other specified parts of digestive tract: Secondary | ICD-10-CM | POA: Diagnosis not present

## 2017-12-17 DIAGNOSIS — R0789 Other chest pain: Secondary | ICD-10-CM | POA: Diagnosis not present

## 2017-12-17 LAB — COMPREHENSIVE METABOLIC PANEL
ALT: 25 U/L (ref 0–44)
ANION GAP: 8 (ref 5–15)
AST: 27 U/L (ref 15–41)
Albumin: 4.4 g/dL (ref 3.5–5.0)
Alkaline Phosphatase: 79 U/L (ref 38–126)
BILIRUBIN TOTAL: 0.4 mg/dL (ref 0.3–1.2)
BUN: 14 mg/dL (ref 6–20)
CO2: 25 mmol/L (ref 22–32)
Calcium: 9.3 mg/dL (ref 8.9–10.3)
Chloride: 106 mmol/L (ref 98–111)
Creatinine, Ser: 0.76 mg/dL (ref 0.44–1.00)
GFR calc Af Amer: >60 mL/min (ref >60)
Glucose, Bld: 125 mg/dL — ABNORMAL HIGH (ref 70–99)
POTASSIUM: 3.5 mmol/L (ref 3.5–5.1)
Sodium: 139 mmol/L (ref 135–145)
TOTAL PROTEIN: 7.5 g/dL (ref 6.5–8.1)

## 2017-12-17 LAB — BRAIN NATRIURETIC PEPTIDE: B NATRIURETIC PEPTIDE 5: 40 pg/mL (ref 0.0–100.0)

## 2017-12-17 LAB — CBC
HCT: 38.6 % (ref 35.0–47.0)
Hemoglobin: 13.4 g/dL (ref 12.0–16.0)
MCH: 31.3 pg (ref 26.0–34.0)
MCHC: 34.6 g/dL (ref 32.0–36.0)
MCV: 90.4 fL (ref 80.0–100.0)
PLATELETS: 228 10*3/uL (ref 150–440)
RBC: 4.27 MIL/uL (ref 3.80–5.20)
RDW: 13.8 % (ref 11.5–14.5)
WBC: 5 10*3/uL (ref 3.6–11.0)

## 2017-12-17 LAB — TROPONIN I: Troponin I: <0.03 ng/mL (ref <0.03)

## 2017-12-17 MED ORDER — ASPIRIN 81 MG PO CHEW
324.0000 mg | CHEWABLE_TABLET | Freq: Once | ORAL | Status: AC
  Administered 2017-12-17: 324 mg via ORAL
  Filled 2017-12-17: qty 4

## 2017-12-17 NOTE — ED Provider Notes (Signed)
[facility] Emergency Department Provider Note  ____________________________________________   First MD Initiated Contact with Patient 12/17/17 1620     (approximate)  I have reviewed the triage vital signs and the nursing notes.   HISTORY  Chief Complaint Chest Pain    HPI Anna Stewart is a 62 y.o. female with medical history as listed below who presents for evaluation of acute onset chest discomfort today.  She reports that she is having some pain in the right side of her neck which then spread down into her upper right chest as a sensation of pressure.  She also felt like she was not able to take a deep breath.  She has never had these symptoms before.  They lasted at least 30 minutes before resolving on their own.  She feels much better now and will not state that the symptoms are completely gone but "mostly" resolved and she says that she feels fine at this time.  She had no sharp stabbing chest pain.  She has had no recent surgeries or immobilizations or long trips.  She has never had blood clots in her legs nor lungs.  She does not use exogenous estrogen.  She has had some mild bilateral lower extremity swelling over the last week that seems to be worse in the evenings and better during the day.  She has mild and well-controlled hypertension, no diabetes, no hypercholesterolemia, no smoking history.  She has no first-degree relatives that have had heart attacks.  She has had no nausea, vomiting, fever/chills, nor abdominal pain.  Past Medical History:  Diagnosis Date  . Anxiety   . AR (allergic rhinitis)   . Depression   . Hair loss   . Hypertension    x 5years  . Migraine   . Psoriasis   . Seizure ([place])    x 17years  . Seizure disorder ([place])   . Vitamin D deficiency     Patient Active Problem List   Diagnosis Date Noted  . Vaginal atrophy 05/14/2015  . Menopause 05/14/2015  . Headache, migraine 05/14/2015  . Benign essential HTN  10/12/2014  . B12 deficiency 08/30/2013  . Clinical depression 08/30/2013  . Seizure ([place]) 07/10/2013  . Dupuytren's contracture of foot 07/10/2013  . 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 BREAST LUMPECTOMY    . CESAREAN SECTION    . CHOLECYSTECTOMY    . KNEE SURGERY    . KNEE SURGERY    . TONSILLECTOMY    . TUBAL LIGATION      Prior to Admission medications   Medication Sig Start Date End Date Taking? Authorizing Provider  calcium carbonate (OS-CAL) 600 MG TABS Take 600 mg by mouth 2 (two) times daily with a meal.   Yes [provider]  cholecalciferol (VITAMIN D) 1000 UNITS tablet Take 1,000 Units by mouth daily.   Yes [provider]  fluticasone (FLONASE) 50 MCG/ACT nasal spray Place 2 sprays into both nostrils daily. 12/16/17  Yes [provider]  LAMICTAL XR 200 MG TB24 Take 1 tablet (200 mg total) by mouth 2 (two) times daily. Brand Medically Necessary 12/08/12  Yes Penumalli, Earlean Polka, MD  lisinopril-hydrochlorothiazide (PRINZIDE,ZESTORETIC) 10-12.5 MG per tablet Take 0.5 tablets by mouth daily.  08/11/12  Yes [provider]  Multiple Vitamin (MULTI-VITAMINS) TABS Take 1 tablet by mouth daily.    Yes [provider]  cephALEXin (KEFLEX) 500 MG capsule Take 1 capsule (  500 mg total) by mouth 3 (three) times daily. Patient not taking: Reported on 12/17/2017 11/27/15   Paulette Blanch, MD  conjugated estrogens (PREMARIN) vaginal cream Place 6.31 Applicatorfuls vaginally daily. 1/2 gram twice weekly. Patient not taking: Reported on 12/17/2017 05/14/15   Defrancesco, Alanda Slim, MD    Allergies Keppra [levetiracetam] and Tegretol [carbamazepine]  Family History  Problem Relation Age of Onset  . Brain cancer Mother        tumor  . Diabetes Father   . Cancer Father   . Heart disease Father   . Lung cancer Father     Social History Social History   Tobacco Use  . Smoking  status: Never Smoker  . Smokeless tobacco: Never Used  Substance Use Topics  . Alcohol use: Yes    Comment: Rarely- wine  . Drug use: No    Review of Systems Constitutional: No fever/chills Eyes: No visual changes. ENT: No sore throat. Cardiovascular: Chest pressure as described above Respiratory: Shortness of breath associated with chest pressure as described above Gastrointestinal: No abdominal pain.  No nausea, no vomiting.  No diarrhea.  No constipation. Genitourinary: Negative for dysuria. Musculoskeletal: Negative for neck pain.  Negative for back pain. Integumentary: Negative for rash. Neurological: Negative for headaches, focal weakness or numbness.   ____________________________________________   PHYSICAL EXAM:  VITAL SIGNS: ED Triage Vitals  Enc Vitals Group     BP 12/17/17 1449 111/68     Pulse Rate 12/17/17 1449 90     Resp 12/17/17 1449 20     Temp 12/17/17 1449 98.3 F (36.8 C)     Temp Source 12/17/17 1449 Oral     SpO2 12/17/17 1449 95 %     Weight 12/17/17 1450 83.9 kg (185 lb)     Height 12/17/17 1450 1.727 m (5\' 8" )     Head Circumference --      Peak Flow --      Pain Score 12/17/17 1450 8     Pain Loc --      Pain Edu? --      Excl. in [place]? --     Constitutional: Alert and oriented. Well appearing and in no acute distress. Eyes: Conjunctivae are normal.  Head: Atraumatic. Nose: No congestion/rhinnorhea. Mouth/Throat: Mucous membranes are moist. Neck: No stridor.  No meningeal signs.   Cardiovascular: Normal rate, regular rhythm. Good peripheral circulation. Grossly normal heart sounds. Respiratory: Normal respiratory effort.  No retractions. Lungs CTAB. Gastrointestinal: Soft and nontender. No distention.  Musculoskeletal: No lower extremity tenderness nor edema. No gross deformities of extremities. Neurologic:  Normal speech and language. No gross focal neurologic deficits are appreciated.  Skin:  Skin is warm, dry and intact. No rash  noted. Psychiatric: Mood and affect are normal. Speech and behavior are normal.  ____________________________________________   LABS (all labs ordered are listed, but only abnormal results are displayed)  Labs Reviewed  COMPREHENSIVE METABOLIC PANEL - Abnormal; Notable for the following components:      Result Value   Glucose, Bld 125 (*)    All other components within normal limits  CBC  TROPONIN I  BRAIN NATRIURETIC PEPTIDE  TROPONIN I  POC URINE PREG, ED   ____________________________________________  EKG  ED ECG REPORT I, Hinda Kehr, the attending physician, personally viewed and interpreted this ECG.  Date: 12/17/2017 EKG Time: 14: 41 Rate: 92 Rhythm: normal sinus rhythm QRS Axis: normal Intervals: normal ST/T Wave abnormalities: normal Narrative Interpretation: no evidence of acute ischemia  ____________________________________________  RADIOLOGY Ursula Alert, personally viewed and evaluated these images (plain radiographs) as part of my medical decision making, as well as reviewing the written report by the radiologist.  ED MD interpretation: No evidence of acute abnormalities on chest x-ray  Official radiology report(s): Dg Chest 2 View  Result Date: 12/17/2017 CLINICAL DATA:  Right chest pain. EXAM: CHEST - 2 VIEW COMPARISON:  06/08/2011 FINDINGS: Coarse lung markings particularly in the lower chest. No focal airspace disease or pulmonary edema. Heart and mediastinum are within normal limits. Trachea is midline. Surgical clips in the right upper abdomen. No large effusions. Bone structures are unremarkable. Negative for a pneumothorax. IMPRESSION: Evidence for chronic lung changes without acute findings. Electronically Signed   By: Markus Daft M.D.   On: 12/17/2017 15:19    ____________________________________________   PROCEDURES  Critical Care performed: No   Procedure(s) performed:    Procedures   ____________________________________________   INITIAL IMPRESSION / ASSESSMENT AND PLAN / ED COURSE  As part of my medical decision making, I reviewed the following data within the [place] notes reviewed and incorporated, Labs reviewed , EKG interpreted  and Radiograph reviewed     Differential diagnosis includes, but is not limited to, ACS, aortic dissection, pulmonary embolism, cardiac tamponade, pneumothorax, pneumonia, pericarditis, myocarditis, GI-related causes including esophagitis/gastritis, and musculoskeletal chest wall pain.    The patient is calm and appropriate, and reporting no symptoms at this time.  Vital signs are all within normal limits.  Chest x-ray shows no acute findings and EKG is normal with no evidence of ischemia.  Troponin is negative, conference of metabolic panel, BNP, and CBC are all within normal limits.  She is having no abdominal pain or discomfort and no nausea or vomiting.  She is low risk for ACS based on HEART score but I am going to check a second troponin because I think this may be anginal discomfort.  She has a Wells score of 0 for PE and I do not think it is necessary to further investigate this possibility.  She is comfortable with the plan for checking a second troponin following up as an outpatient with cardiology for possible stress test.  I am giving her a full dose aspirin in the ED and encouraged her to tell me if she has a repeat of her symptoms.  She agrees with the plan.  Clinical Course as of Dec 18 2110  Fri Dec 17, 2017  1102 Second troponin is negative and patient is asymptomatic.  She is comfortable with the plan for discharge and outpatient follow-up.  I reiterated my return precautions.    [CF]    Clinical Course User Index [CF] Hinda Kehr, MD    ____________________________________________  FINAL CLINICAL IMPRESSION(S) / ED DIAGNOSES  Final diagnoses:  Chest pain, unspecified  type     MEDICATIONS GIVEN DURING THIS VISIT:  Medications  aspirin chewable tablet 324 mg (324 mg Oral Given 12/17/17 1714)     ED Discharge Orders    None       Note:  This document was prepared using Dragon voice recognition software and may include unintentional dictation errors.    Hinda Kehr, MD 12/17/17 2112

## 2017-12-17 NOTE — ED Triage Notes (Signed)
States this am noted some throat discomfort, then noticed upper chest pressure and sensation of not being able to take a deep breath. States has noted bilateral pedal and lower leg edema x 1 week.

## 2017-12-17 NOTE — Discharge Instructions (Addendum)

## 2017-12-21 DIAGNOSIS — I10 Essential (primary) hypertension: Secondary | ICD-10-CM | POA: Diagnosis not present

## 2017-12-21 DIAGNOSIS — R079 Chest pain, unspecified: Secondary | ICD-10-CM | POA: Diagnosis not present

## 2017-12-21 DIAGNOSIS — R9431 Abnormal electrocardiogram [ECG] [EKG]: Secondary | ICD-10-CM | POA: Diagnosis not present

## 2017-12-23 DIAGNOSIS — R079 Chest pain, unspecified: Secondary | ICD-10-CM | POA: Diagnosis not present

## 2017-12-23 DIAGNOSIS — E782 Mixed hyperlipidemia: Secondary | ICD-10-CM | POA: Diagnosis not present

## 2017-12-23 DIAGNOSIS — I10 Essential (primary) hypertension: Secondary | ICD-10-CM | POA: Diagnosis not present

## 2017-12-24 DIAGNOSIS — R079 Chest pain, unspecified: Secondary | ICD-10-CM | POA: Diagnosis not present

## 2017-12-27 DIAGNOSIS — R072 Precordial pain: Secondary | ICD-10-CM | POA: Diagnosis not present

## 2017-12-27 DIAGNOSIS — R0602 Shortness of breath: Secondary | ICD-10-CM | POA: Diagnosis not present

## 2018-01-17 DIAGNOSIS — R079 Chest pain, unspecified: Secondary | ICD-10-CM | POA: Diagnosis not present

## 2018-01-17 DIAGNOSIS — I2510 Atherosclerotic heart disease of native coronary artery without angina pectoris: Secondary | ICD-10-CM | POA: Diagnosis not present

## 2018-01-17 DIAGNOSIS — K219 Gastro-esophageal reflux disease without esophagitis: Secondary | ICD-10-CM | POA: Diagnosis not present

## 2018-01-21 ENCOUNTER — Other Ambulatory Visit: Payer: Self-pay | Admitting: Family Medicine

## 2018-01-21 DIAGNOSIS — Z1231 Encounter for screening mammogram for malignant neoplasm of breast: Secondary | ICD-10-CM

## 2018-02-22 ENCOUNTER — Ambulatory Visit
Admission: RE | Admit: 2018-02-22 | Discharge: 2018-02-22 | Disposition: A | Discharge status: Home / Self Care | Payer: 59 | Source: Ambulatory Visit | Attending: Family Medicine | Admitting: Family Medicine

## 2018-02-22 DIAGNOSIS — Z1231 Encounter for screening mammogram for malignant neoplasm of breast: Secondary | ICD-10-CM | POA: Diagnosis not present

## 2018-03-01 DIAGNOSIS — R05 Cough: Secondary | ICD-10-CM | POA: Diagnosis not present

## 2018-03-01 DIAGNOSIS — J0100 Acute maxillary sinusitis, unspecified: Secondary | ICD-10-CM | POA: Diagnosis not present

## 2018-03-08 DIAGNOSIS — I10 Essential (primary) hypertension: Secondary | ICD-10-CM | POA: Diagnosis not present

## 2018-03-08 DIAGNOSIS — R0602 Shortness of breath: Secondary | ICD-10-CM | POA: Diagnosis not present

## 2018-03-08 DIAGNOSIS — K210 Gastro-esophageal reflux disease with esophagitis: Secondary | ICD-10-CM | POA: Diagnosis not present

## 2018-03-22 DIAGNOSIS — G40909 Epilepsy, unspecified, not intractable, without status epilepticus: Secondary | ICD-10-CM | POA: Diagnosis not present

## 2018-03-22 DIAGNOSIS — M79671 Pain in right foot: Secondary | ICD-10-CM | POA: Diagnosis not present

## 2018-03-24 DIAGNOSIS — M79671 Pain in right foot: Secondary | ICD-10-CM | POA: Insufficient documentation

## 2018-03-24 DIAGNOSIS — M79672 Pain in left foot: Secondary | ICD-10-CM | POA: Insufficient documentation

## 2018-07-26 DIAGNOSIS — I2510 Atherosclerotic heart disease of native coronary artery without angina pectoris: Secondary | ICD-10-CM | POA: Diagnosis not present

## 2018-07-26 DIAGNOSIS — I10 Essential (primary) hypertension: Secondary | ICD-10-CM | POA: Diagnosis not present

## 2018-07-26 DIAGNOSIS — R0602 Shortness of breath: Secondary | ICD-10-CM | POA: Diagnosis not present

## 2018-07-29 DIAGNOSIS — R0602 Shortness of breath: Secondary | ICD-10-CM | POA: Diagnosis not present

## 2018-07-29 DIAGNOSIS — I2510 Atherosclerotic heart disease of native coronary artery without angina pectoris: Secondary | ICD-10-CM | POA: Diagnosis not present

## 2018-07-29 DIAGNOSIS — I10 Essential (primary) hypertension: Secondary | ICD-10-CM | POA: Diagnosis not present

## 2019-05-18 DIAGNOSIS — Z79899 Other long term (current) drug therapy: Secondary | ICD-10-CM | POA: Insufficient documentation

## 2019-05-18 DIAGNOSIS — E559 Vitamin D deficiency, unspecified: Secondary | ICD-10-CM | POA: Insufficient documentation

## 2020-01-22 ENCOUNTER — Other Ambulatory Visit: Payer: Self-pay | Admitting: Family Medicine

## 2020-01-22 DIAGNOSIS — Z1231 Encounter for screening mammogram for malignant neoplasm of breast: Secondary | ICD-10-CM

## 2020-04-25 ENCOUNTER — Other Ambulatory Visit: Payer: Self-pay

## 2020-04-25 ENCOUNTER — Ambulatory Visit
Admission: RE | Admit: 2020-04-25 | Discharge: 2020-04-25 | Disposition: A | Discharge status: Home / Self Care | Payer: 59 | Source: Ambulatory Visit | Attending: Family Medicine | Admitting: Family Medicine

## 2020-04-25 DIAGNOSIS — Z1231 Encounter for screening mammogram for malignant neoplasm of breast: Secondary | ICD-10-CM | POA: Insufficient documentation

## 2020-05-01 ENCOUNTER — Other Ambulatory Visit: Payer: Self-pay | Admitting: Family Medicine

## 2020-05-01 DIAGNOSIS — N6320 Unspecified lump in the left breast, unspecified quadrant: Secondary | ICD-10-CM

## 2020-05-01 DIAGNOSIS — R928 Other abnormal and inconclusive findings on diagnostic imaging of breast: Secondary | ICD-10-CM

## 2020-05-07 ENCOUNTER — Telehealth: Payer: Self-pay | Admitting: *Deleted

## 2020-05-07 NOTE — Telephone Encounter (Signed)
CALLED PT ON 1/27 TO [facility] - NO RESPONSE, CALLED PT AGAIN ON 05/07/20 - PT DID ANSWER BUT STATED SHE WAS WAITING TO SPEAK TO HER DOCTOR 1ST BEFORE SHE SCHEDULED HER AV / MAMMO - PT WCB TO [facility]

## 2020-05-16 DIAGNOSIS — M1990 Unspecified osteoarthritis, unspecified site: Secondary | ICD-10-CM | POA: Insufficient documentation

## 2020-05-20 ENCOUNTER — Ambulatory Visit
Admission: RE | Admit: 2020-05-20 | Discharge: 2020-05-20 | Disposition: A | Discharge status: Home / Self Care | Payer: 59 | Source: Ambulatory Visit | Attending: Family Medicine | Admitting: Family Medicine

## 2020-05-20 ENCOUNTER — Other Ambulatory Visit: Payer: Self-pay

## 2020-05-20 DIAGNOSIS — N6320 Unspecified lump in the left breast, unspecified quadrant: Secondary | ICD-10-CM

## 2020-05-20 DIAGNOSIS — R928 Other abnormal and inconclusive findings on diagnostic imaging of breast: Secondary | ICD-10-CM | POA: Insufficient documentation

## 2020-05-22 ENCOUNTER — Other Ambulatory Visit: Payer: Self-pay | Admitting: Family Medicine

## 2020-05-22 DIAGNOSIS — N6320 Unspecified lump in the left breast, unspecified quadrant: Secondary | ICD-10-CM

## 2020-05-22 DIAGNOSIS — R928 Other abnormal and inconclusive findings on diagnostic imaging of breast: Secondary | ICD-10-CM

## 2020-05-30 ENCOUNTER — Ambulatory Visit
Admission: RE | Admit: 2020-05-30 | Discharge: 2020-05-30 | Disposition: A | Discharge status: Home / Self Care | Payer: 59 | Source: Ambulatory Visit | Attending: Family Medicine | Admitting: Family Medicine

## 2020-05-30 ENCOUNTER — Other Ambulatory Visit: Payer: Self-pay

## 2020-05-30 DIAGNOSIS — R928 Other abnormal and inconclusive findings on diagnostic imaging of breast: Secondary | ICD-10-CM | POA: Diagnosis present

## 2020-05-30 DIAGNOSIS — N6320 Unspecified lump in the left breast, unspecified quadrant: Secondary | ICD-10-CM | POA: Diagnosis present

## 2020-05-30 HISTORY — PX: BREAST BIOPSY: SHX20

## 2020-06-04 DIAGNOSIS — C801 Malignant (primary) neoplasm, unspecified: Secondary | ICD-10-CM

## 2020-06-04 HISTORY — DX: Malignant (primary) neoplasm, unspecified: C80.1

## 2020-06-07 ENCOUNTER — Other Ambulatory Visit: Payer: Self-pay | Admitting: Pathology

## 2020-06-07 LAB — SURGICAL PATHOLOGY

## 2020-06-11 ENCOUNTER — Telehealth: Payer: Self-pay | Admitting: Hematology and Oncology

## 2020-06-11 NOTE — Telephone Encounter (Signed)
Received an urgent referral Drom Dr. Donne Hazel for breast cancer. Ms. Bove has been cld and scheduled to see Dr. Lindi Adie on 3/15 at 4:15pm. Pt aware to arrive 20 minutes early.

## 2020-06-13 NOTE — Progress Notes (Signed)
Introduced navigation to patient and her husband.  Patient scheduled to see Dr. Janese Banks per Dr. Cristal Generous referral, on 06/18/20.  Plan to accompany to appointment.

## 2020-06-18 ENCOUNTER — Other Ambulatory Visit: Payer: Self-pay

## 2020-06-18 ENCOUNTER — Inpatient Hospital Stay: Payer: 59

## 2020-06-18 ENCOUNTER — Ambulatory Visit: Payer: 59 | Attending: General Surgery | Admitting: Physical Therapy

## 2020-06-18 ENCOUNTER — Encounter: Payer: Self-pay | Admitting: Oncology

## 2020-06-18 ENCOUNTER — Inpatient Hospital Stay: Payer: 59 | Attending: Oncology | Admitting: Oncology

## 2020-06-18 ENCOUNTER — Ambulatory Visit: Payer: 59 | Admitting: Hematology and Oncology

## 2020-06-18 ENCOUNTER — Encounter: Payer: Self-pay | Admitting: Physical Therapy

## 2020-06-18 VITALS — BP 119/61 | HR 83 | Temp 97.3°F | Resp 16 | Ht 68.0 in | Wt 188.0 lb

## 2020-06-18 DIAGNOSIS — R293 Abnormal posture: Secondary | ICD-10-CM | POA: Insufficient documentation

## 2020-06-18 DIAGNOSIS — Z808 Family history of malignant neoplasm of other organs or systems: Secondary | ICD-10-CM | POA: Diagnosis not present

## 2020-06-18 DIAGNOSIS — C50912 Malignant neoplasm of unspecified site of left female breast: Secondary | ICD-10-CM

## 2020-06-18 DIAGNOSIS — Z171 Estrogen receptor negative status [ER-]: Secondary | ICD-10-CM | POA: Insufficient documentation

## 2020-06-18 DIAGNOSIS — Z79899 Other long term (current) drug therapy: Secondary | ICD-10-CM | POA: Insufficient documentation

## 2020-06-18 DIAGNOSIS — I10 Essential (primary) hypertension: Secondary | ICD-10-CM | POA: Diagnosis not present

## 2020-06-18 DIAGNOSIS — Z801 Family history of malignant neoplasm of trachea, bronchus and lung: Secondary | ICD-10-CM | POA: Diagnosis not present

## 2020-06-18 DIAGNOSIS — Z8249 Family history of ischemic heart disease and other diseases of the circulatory system: Secondary | ICD-10-CM | POA: Insufficient documentation

## 2020-06-18 DIAGNOSIS — Z7189 Other specified counseling: Secondary | ICD-10-CM | POA: Diagnosis not present

## 2020-06-18 DIAGNOSIS — C50512 Malignant neoplasm of lower-outer quadrant of left female breast: Secondary | ICD-10-CM | POA: Insufficient documentation

## 2020-06-18 DIAGNOSIS — Z833 Family history of diabetes mellitus: Secondary | ICD-10-CM | POA: Diagnosis not present

## 2020-06-18 DIAGNOSIS — G40909 Epilepsy, unspecified, not intractable, without status epilepticus: Secondary | ICD-10-CM | POA: Insufficient documentation

## 2020-06-18 NOTE — Therapy (Signed)
[place], [place], [address] Phone: [phone]   Fax:  [phone]  Physical Therapy Evaluation  Patient Details  Name: Anna Stewart MRN: [id number] Date of Birth: 1955-07-16 Referring Provider (PT): Donne Hazel   Encounter Date: 06/18/2020   PT End of Session - 06/18/20 1548    Visit Number 1    Number of Visits 2    Date for PT Re-Evaluation 07/30/20    PT Start Time 1503    PT Stop Time 1547    PT Time Calculation (min) 44 min    Activity Tolerance Patient tolerated treatment well    Behavior During Therapy [facility] for tasks assessed/performed           Past Medical History:  Diagnosis Date  . Allergy   . AR (allergic rhinitis)   . Arthritis   . Hair loss   . Hypertension    x 5years  . Migraine   . Psoriasis   . Seizure ([place])    x 17years  . Seizure disorder ([place])   . Vitamin D deficiency     Past Surgical History:  Procedure Laterality Date  . BREAST BIOPSY Right    benign ? date-before 2006  . BREAST BIOPSY Left 05/30/2020   [place] bx, coil marker, path pending  . CESAREAN SECTION    . CHOLECYSTECTOMY    . KNEE SURGERY    . TONSILLECTOMY    . TUBAL LIGATION      There were no vitals filed for this visit.    Subjective Assessment - 06/18/20 1507    Subjective I am sad. It is triple negative which is harder to deal with. I have to have chemo after surgery.    Pertinent History L breast cancer triple negative, plan is to have a L lumpectomy and SLNB, then chemo, then radiation,    Patient Stated Goals to gain info from provider    Currently in Pain? No/denies    Pain Score 0-No pain              [facility] PT Assessment - 06/18/20 0001      Assessment   Medical Diagnosis left breast cancer    Referring Provider (PT) Donne Hazel    Onset Date/Surgical Date 05/20/20    Hand Dominance Right    Prior Therapy none      Precautions   Precautions Other (comment)     Precaution Comments active cancer      Restrictions   Weight Bearing Restrictions No      Balance Screen   Has the patient fallen in the past 6 months Yes    How many times? slipped on bottom 2 steps and broke ribs 3 months    Has the patient had a decrease in activity level because of a fear of falling?  No    Is the patient reluctant to leave their home because of a fear of falling?  No      Home Environment   Living Environment Private residence    Living Arrangements Spouse/significant other;Children   Anna Stewart 57   Available Help at Discharge Family    Type of [place]      Prior Function   Level of [place] Retired    Leisure have not exercised since the winter started      Cognition   Overall Cognitive Status Within Functional Limits for tasks assessed      Posture/Postural Control  Posture/Postural Control Postural limitations    Postural Limitations Rounded Shoulders;Forward head      ROM / Strength   AROM / PROM / Strength AROM      AROM   Right Shoulder Extension 70 Degrees    Right Shoulder Flexion 159 Degrees    Right Shoulder ABduction 177 Degrees    Right Shoulder Internal Rotation 49 Degrees    Right Shoulder External Rotation 90 Degrees    Left Shoulder Extension 65 Degrees    Left Shoulder Flexion 165 Degrees    Left Shoulder ABduction 178 Degrees    Left Shoulder Internal Rotation 62 Degrees    Left Shoulder External Rotation 90 Degrees             LYMPHEDEMA/ONCOLOGY QUESTIONNAIRE - 06/18/20 0001      Type   Cancer Type left breast cancer      Lymphedema Assessments   Lymphedema Assessments Upper extremities      Right Upper Extremity Lymphedema   15 cm Proximal to Olecranon Process 31.2 cm    Olecranon Process 26.8 cm    15 cm Proximal to Ulnar Styloid Process 25.4 cm    Just Proximal to Ulnar Styloid Process 14.6 cm    Across Hand at [employer] 18.1 cm    At [place] of 2nd Digit 6.5 cm      Left  Upper Extremity Lymphedema   15 cm Proximal to Olecranon Process 31.8 cm    Olecranon Process 28 cm    15 cm Proximal to Ulnar Styloid Process 25.8 cm    Just Proximal to Ulnar Styloid Process 15.5 cm    Across Hand at [employer] 18.5 cm    At [place] of 2nd Digit 6 cm           L-DEX FLOWSHEETS - 06/18/20 1500      L-DEX LYMPHEDEMA SCREENING   Measurement Type Unilateral    L-DEX MEASUREMENT EXTREMITY Upper Extremity    POSITION  Standing    DOMINANT SIDE Right    At Risk Side Left    BASELINE SCORE (UNILATERAL) 4.4           The patient was assessed using the L-Dex machine today to produce a lymphedema index baseline score. The patient will be reassessed on a regular basis (typically every 3 months) to obtain new L-Dex scores. If the score is > 6.5 points away from his/her baseline score indicating onset of subclinical lymphedema, it will be recommended to wear a compression garment for 4 weeks, 12 hours per day and then be reassessed. If the score continues to be > 6.5 points from baseline at reassessment, we will initiate lymphedema treatment. Assessing in this manner has a 95% rate of preventing clinically significant lymphedema.        Objective measurements completed on examination: See above findings.                    PT Long Term Goals - 06/18/20 1552      PT LONG TERM GOAL #1   Title Pt will return to baseline ROM measurements and not demonstrate any signs or symptoms of lymphedema.    Time 6    Period Weeks    Status New    Target Date 07/30/20           Breast Clinic Goals - 06/18/20 1551      Patient will be able to verbalize understanding of pertinent lymphedema risk reduction practices  relevant to her diagnosis specifically related to skin care.   Time 1    Period Days    Status Achieved      Patient will be able to return demonstrate and/or verbalize understanding of the post-op home exercise program related to regaining  shoulder range of motion.   Time 1    Period Days    Status Achieved      Patient will be able to verbalize understanding of the importance of attending the postoperative After Breast Cancer Class for further lymphedema risk reduction education and therapeutic exercise.   Time 1    Period Days    Status Achieved                 Plan - 06/18/20 1549    Clinical Impression Statement Pt was diagnosed with L breast cancer in mid Feb 2022. It is triple negative. The plan is for pt to undergo a L lumpectomy and SLNB followed by chemo and then radiation. Her current shoulder ROM is WFL. Pt was instructed in post op exercises and educated that if she has a mastectomy then not to begin exercises until a week after the last drain is removed. Baseline ROM and SOZO measurements taken today. She will benefit from a post op PT reassessment to determine needs and in every 3 months for additional L dex screening to detect subclinical lymphedema.    Stability/Clinical Decision Making Stable/Uncomplicated    Clinical Decision Making Low    PT Frequency Other (comment)   eval and 1 f/u visit   PT Duration 6 weeks    PT Treatment/Interventions ADLs/Self Care Home Management;Patient/family education;Therapeutic exercise    PT Next Visit Plan reassess baselines; schedule SOZO, sign pt up for ABC class    PT Home Exercise Plan post op breast exercises    Consulted and Agree with Plan of Care Patient           Patient will benefit from skilled therapeutic intervention in order to improve the following deficits and impairments:  Decreased knowledge of precautions,Postural dysfunction  Visit Diagnosis: Abnormal posture  Malignant neoplasm of lower-outer quadrant of left breast of female, estrogen receptor negative ([place])   Patient will follow up at outpatient cancer rehab 3-4 weeks following surgery.  If the patient requires physical therapy at that time, a specific plan will be dictated and sent to  the referring physician for approval. The patient was educated today on appropriate basic range of motion exercises to begin post operatively and the importance of attending the After Breast Cancer class following surgery.  Patient was educated today on lymphedema risk reduction practices as it pertains to recommendations that will benefit the patient immediately following surgery.  She verbalized good understanding.      Problem List Patient Active Problem List   Diagnosis Date Noted  . Vaginal atrophy 05/14/2015  . Menopause 05/14/2015  . Headache, migraine 05/14/2015  . Benign essential HTN 10/12/2014  . B12 deficiency 08/30/2013  . Clinical depression 08/30/2013  . Seizure ([place]) 07/10/2013  . Dupuytren's contracture of foot 07/10/2013  . 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

## 2020-06-18 NOTE — Progress Notes (Signed)
Supported patient, husband Event organiser, at initial Med/Onc consult with Dr. Janese Banks.  Given Breast Cancer Treatment Handbook.  Referral to Faith Rogue, Genetics Counselor submitted.  Patient to call back when surgery is scheduled, so follow-up with Dr. Leitha Bleak Dr. Baruch Gouty can be scheduled.

## 2020-06-18 NOTE — Progress Notes (Signed)
Pt new breast cancer, triple neg. Pt has arthritis of her hands and it hurts. She has seizures a long time ago and for over 7 years she has not had seizure.

## 2020-06-19 ENCOUNTER — Encounter: Payer: Self-pay | Admitting: Oncology

## 2020-06-19 ENCOUNTER — Other Ambulatory Visit: Payer: Self-pay | Admitting: General Surgery

## 2020-06-19 DIAGNOSIS — Z171 Estrogen receptor negative status [ER-]: Secondary | ICD-10-CM | POA: Insufficient documentation

## 2020-06-19 DIAGNOSIS — C50412 Malignant neoplasm of upper-outer quadrant of left female breast: Secondary | ICD-10-CM

## 2020-06-19 DIAGNOSIS — C50512 Malignant neoplasm of lower-outer quadrant of left female breast: Secondary | ICD-10-CM | POA: Insufficient documentation

## 2020-06-19 DIAGNOSIS — C50312 Malignant neoplasm of lower-inner quadrant of left female breast: Secondary | ICD-10-CM | POA: Insufficient documentation

## 2020-06-19 NOTE — Progress Notes (Signed)
Hematology/Oncology Consult note [facility] [phone] [phone]  Patient Care Team: Derinda Late, MD as PCP - General (Family Medicine) Theodore Demark, RN as Oncology Nurse Navigator   Name of the patient: Anna Stewart  [id number]  20-Jan-1956    Reason for referral-new diagnosis of breast cancer   Referring physician-Dr. Donne Hazel  Date of visit: 06/19/20   History of presenting illness- Patient is a 65 year old female who underwent a routine screening bilateral mammogram on 04/25/2020 which suggested a possible mass in the left breast.  This was followed by diagnostic mammogram and ultrasound which showed a 7 x 7 x 5 mm hypoechoic mass in the 7 o'clock position of the left breast 8 cm from the nipple.  Ultrasound of the left axilla demonstrates 1 normal-appearing left axillary lymph nodes including 1 with borderline diffuse cortical thickening measuring 3.8 mm in maximum thickness.  No  Lymph nodes with focal or eccentric cortical thickening were seen.  The suspicious breast mass was biopsied and was consistent with triple negative grade 3 invasive mammary carcinoma patient has met with Dr. Donne Hazel and will be undergoing lumpectomy with sentinel lymph node biopsy soon.   patient is G3, P3 L3.  Age at menarche 23 years.  Menopause about 7 years ago.  Used birth control briefly in the past.  No use of hormone replacement therapy.  She has not had any prior breast biopsies.  Patient currently feels well and denies any complaints at this time  ECOG PS- 0  Pain scale- 0   Review of systems- Review of Systems  Constitutional: Negative for chills, fever, malaise/fatigue and weight loss.  HENT: Negative for congestion, ear discharge and nosebleeds.   Eyes: Negative for blurred vision.  Respiratory: Negative for cough, hemoptysis, sputum production, shortness of breath and wheezing.   Cardiovascular: Negative for chest pain,  palpitations, orthopnea and claudication.  Gastrointestinal: Negative for abdominal pain, blood in stool, constipation, diarrhea, heartburn, melena, nausea and vomiting.  Genitourinary: Negative for dysuria, flank pain, frequency, hematuria and urgency.  Musculoskeletal: Negative for back pain, joint pain and myalgias.  Skin: Negative for rash.  Neurological: Negative for dizziness, tingling, focal weakness, seizures, weakness and headaches.  Endo/Heme/Allergies: Does not bruise/bleed easily.  Psychiatric/Behavioral: Negative for depression and suicidal ideas. The patient does not have insomnia.     Allergies  Allergen Reactions  . Keppra [Levetiracetam]   . Tegretol [Carbamazepine]     Patient Active Problem List   Diagnosis Date Noted  . Vaginal atrophy 05/14/2015  . Menopause 05/14/2015  . Headache, migraine 05/14/2015  . Benign essential HTN 10/12/2014  . B12 deficiency 08/30/2013  . Clinical depression 08/30/2013  . Seizure ([place]) 07/10/2013  . Dupuytren's contracture of foot 07/10/2013  . 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 Allergy   . AR (allergic rhinitis)   . Arthritis   . Hair loss   . Hypertension    x 5years  . Migraine   . Psoriasis   . Seizure ([place])    x 17years  . Seizure disorder ([place])   . Vitamin D deficiency      Past Surgical History:  Procedure Laterality Date  . BREAST BIOPSY Right    benign ? date-before 2006  . BREAST BIOPSY Left 05/30/2020   [place] bx, coil marker, path pending  . CESAREAN SECTION    . CHOLECYSTECTOMY    . KNEE SURGERY    . TONSILLECTOMY    .  TUBAL LIGATION      Social History   Socioeconomic History  . Marital status: Married    Spouse name: Event organiser  . Number of children: 3  . Years of education: College  . Highest education level: Not on file  Occupational History  . Occupation:      Employer: OTHER    Comment: Publishing copy  Tobacco Use  .  Smoking status: Never Smoker  . Smokeless tobacco: Never Used  Vaping Use  . Vaping Use: Never used  Substance and Sexual Activity  . Alcohol use: Yes    Comment: Rarely- wine  . Drug use: No  . Sexual activity: Yes    Birth control/protection: Post-menopausal  Other Topics Concern  . Not on file  Social History Narrative   Patient lives at home with her family.   Caffeine Use: 1 cups of coffee daily   Social Determinants of Health   Financial Resource Strain: Not on file  Food Insecurity: Not on file  Transportation Needs: Not on file  Physical Activity: Not on file  Stress: Not on file  Social Connections: Not on file  Intimate Partner Violence: Not on file     Family History  Problem Relation Age of Onset  . Brain cancer Mother        tumor  . Diabetes Father   . Heart disease Father   . Lung cancer Father   . Cancer Paternal Aunt   . Diabetes Maternal Grandmother   . Stroke Maternal Grandfather   . Breast cancer Neg Hx      Current Outpatient Medications:  .  cetirizine (ZYRTEC) 10 MG tablet, Take 10 mg by mouth daily., Disp: , Rfl:  .  fluticasone (FLONASE) 50 MCG/ACT nasal spray, Place 2 sprays into both nostrils daily., Disp: , Rfl:  .  LAMICTAL XR 200 MG TB24, Take 1 tablet (200 mg total) by mouth 2 (two) times daily. Brand Medically Necessary, Disp: 180 tablet, Rfl: 3 .  lisinopril-hydrochlorothiazide (ZESTORETIC) 10-12.5 MG tablet, Take 1 tablet by mouth daily. Pt states 1/2 tablet a day, Disp: , Rfl:  .  Multiple Vitamin (MULTI-VITAMINS) TABS, Take 1 tablet by mouth daily. , Disp: , Rfl:  .  pantoprazole (PROTONIX) 20 MG tablet, Take 20 mg by mouth daily., Disp: , Rfl:    Physical exam:  Vitals:   06/18/20 1134 06/18/20 1140  BP: 119/61   Pulse: 83   Resp: 16   Temp: (!) 97.3 F (36.3 C)   TempSrc: Tympanic   Weight:  188 lb (85.3 kg)  Height:  '5\' 8"'  (1.727 m)   Physical Exam Cardiovascular:     Rate and Rhythm: Normal rate and regular  rhythm.     Heart sounds: Normal heart sounds.  Pulmonary:     Effort: Pulmonary effort is normal.     Breath sounds: Normal breath sounds.  Abdominal:     General: Bowel sounds are normal.     Palpations: Abdomen is soft.  Skin:    General: Skin is warm and dry.  Neurological:     Mental Status: She is alert and oriented to person, place, and time.   Breast exam: Bruising noted at the site of biopsy.  No distinct palpable mass in the left breast.  No palpable left axillary adenopathy.  No palpable right breast mass or right axillary adenopathy    CMP Latest Ref Rng & Units 12/17/2017  Glucose 70 - 99 mg/dL 125(H)  BUN 6 - 20  mg/dL 14  Creatinine 0.44 - 1.00 mg/dL 0.76  Sodium 135 - 145 mmol/L 139  Potassium 3.5 - 5.1 mmol/L 3.5  Chloride 98 - 111 mmol/L 106  CO2 22 - 32 mmol/L 25  Calcium 8.9 - 10.3 mg/dL 9.3  Total Protein 6.5 - 8.1 g/dL 7.5  Total Bilirubin 0.3 - 1.2 mg/dL 0.4  Alkaline Phos 38 - 126 U/L 79  AST 15 - 41 U/L 27  ALT 0 - 44 U/L 25   CBC Latest Ref Rng & Units 12/17/2017  WBC 3.6 - 11.0 K/uL 5.0  Hemoglobin 12.0 - 16.0 g/dL 13.4  Hematocrit 35.0 - 47.0 % 38.6  Platelets 150 - 440 K/uL 228    No images are attached to the encounter.  MM CLIP PLACEMENT LEFT  Result Date: 05/30/2020 CLINICAL DATA:  Post ultrasound-guided core needle biopsy of left breast 7 o'clock mass. EXAM: DIAGNOSTIC LEFT MAMMOGRAM POST ULTRASOUND BIOPSY COMPARISON:  Previous exam(s). FINDINGS: Mammographic images were obtained following ultrasound guided biopsy of left breast 7 o'clock mass. The biopsy marking clip is in expected position at the site of biopsy. IMPRESSION: Appropriate positioning of the coil shaped biopsy marking clip at the site of biopsy in the left breast 7 o'clock mass. Final Assessment: Post Procedure Mammograms for Marker Placement Electronically Signed   By: Fidela Salisbury M.D.   On: 05/30/2020 08:52   [place] LT BREAST BX W LOC DEV 1ST LESION IMG BX SPEC US  GUIDE  Addendum Date: 06/03/2020   ADDENDUM REPORT: 06/03/2020 11:45 ADDENDUM: PATHOLOGY revealed: A. BREAST, LEFT 7:00 8 CM FN; ULTRASOUND-GUIDED BIOPSY: - INVASIVE MAMMARY CARCINOMA, NO SPECIAL TYPE. Size of invasive carcinoma: 10 mm in this sample. Grade 3. Ductal carcinoma in situ: Not identified. Lymphovascular invasion: Not identified. Pathology results are CONCORDANT with imaging findings, per Dr. Fidela Salisbury. At provider's request, provider (Dr. Derinda Late) called patient to check biopsy site, discuss pathology results and arrange surgical consultation appointment for patient. Recommend surgical consultation: On 06/03/2020, Electa Sniff RN notified Al Pimple RN and Tanya Nones RN at [facility] informing them of provider's request to do comprehensive follow up with patient, including surgical consultation appointment. Pathology results reported by Electa Sniff RN on 06/03/2020. Electronically Signed   By: Fidela Salisbury M.D.   On: 06/03/2020 11:45   Result Date: 06/03/2020 CLINICAL DATA:  Left breast 7 o'clock mass. EXAM: ULTRASOUND GUIDED LEFT BREAST CORE NEEDLE BIOPSY COMPARISON:  Previous exam(s). PROCEDURE: I met with the patient and we discussed the procedure of ultrasound-guided biopsy, including benefits and alternatives. We discussed the high likelihood of a successful procedure. We discussed the risks of the procedure, including infection, bleeding, tissue injury, clip migration, and inadequate sampling. Informed written consent was given. The usual time-out protocol was performed immediately prior to the procedure. Lesion quadrant: Lower outer quadrant Using sterile technique and 1% Lidocaine as local anesthetic, under direct ultrasound visualization, a 14 gauge spring-loaded device was used to perform biopsy of left breast 7 o'clock mass using a lateral approach. At the conclusion of the procedure coil shaped tissue marker clip was deployed into the biopsy  cavity. Follow up 2 view mammogram was performed and dictated separately. IMPRESSION: Ultrasound guided biopsy of the left breast. No apparent complications. Electronically Signed: By: Fidela Salisbury M.D. On: 05/30/2020 09:43    Assessment and plan- Patient is a 65 y.o. female with newly diagnosed clinical prognostic stage IB triple negative breast cancer cT1b cN0 cM0 here to discuss further management  We have discussed patient's case at tumor board yesterday.  Discussed the findings of mammogram with patient and her husband in detail.  The suspicious mass in the left breast was well-defined and her preoperative MRI was not deemed necessary.  There are no suspicious lymph nodes in the left axilla.  The mass measured 7 mm on ultrasound and 1 cm upon the biopsy.  Discussed with the patient that she has triple negative breast cancer which would mean that chemotherapy would be indicated.  Given that the breast mass was close to 1 cm with normal-appearing lymph nodes, I would recommend proceeding with upfront surgery and getting port placed at the same time.  I would however recommend adjuvant chemotherapy upon completion of surgery.  Patient will also need adjuvant radiation therapy.  Neoadjuvant chemotherapy would have been indicated if there was lymph node involvement or mass greater than 1 cm.  Depending on the results of final pathology we will decide if she would need TC chemotherapy versus AC Taxol chemotherapy.  Discussed risks and benefits of each regimen including all but not limited to nausea, vomiting, low blood counts, risk of infections and hospitalizations.  Risk of cardiotoxicity associated with anthracycline.  Treatment will be given with a curative intent.  Patient understands and agrees to proceed as planned.  There would be no role for hormone therapy in ER/PR negative disease.  We will also refer her for genetic testing at this time.  However even if patient is BRCA positive she does  not wish to wait for those results to decide about mastectomy.  She would like to proceed with an upfront lumpectomy and sentinel lymph node biopsy at this time.    Patient and her husband had multiple insightful questions and all of them were answered to their satisfaction.   Cancer Staging Malignant neoplasm of lower-outer quadrant of left breast of female, estrogen receptor negative ([place]) Staging form: Breast, AJCC 8th Edition - Clinical stage from 06/19/2020: Stage IB (cT1b, cN0, cM0, G3, ER-, PR-, HER2-) - Signed by Sindy Guadeloupe, MD on 06/19/2020 Stage prefix: Initial diagnosis Histologic grading system: 3 grade system     Thank you for this kind referral and the opportunity to participate in the care of this patient   Visit Diagnosis 1. Malignant neoplasm of lower-outer quadrant of left breast of female, estrogen receptor negative ([place])   2. Goals of care, counseling/discussion     Dr. [name], MD, MPH [facility] [phone] 06/19/2020  7:51 PM

## 2020-06-20 ENCOUNTER — Other Ambulatory Visit: Payer: Self-pay

## 2020-06-20 ENCOUNTER — Inpatient Hospital Stay (HOSPITAL_BASED_OUTPATIENT_CLINIC_OR_DEPARTMENT_OTHER): Payer: 59 | Admitting: Licensed Clinical Social Worker

## 2020-06-20 ENCOUNTER — Encounter: Payer: Self-pay | Admitting: Licensed Clinical Social Worker

## 2020-06-20 ENCOUNTER — Other Ambulatory Visit
Admission: RE | Admit: 2020-06-20 | Discharge: 2020-06-20 | Disposition: A | Discharge status: Home / Self Care | Payer: 59 | Source: Ambulatory Visit | Attending: General Surgery | Admitting: General Surgery

## 2020-06-20 ENCOUNTER — Encounter (HOSPITAL_BASED_OUTPATIENT_CLINIC_OR_DEPARTMENT_OTHER): Payer: Self-pay | Admitting: General Surgery

## 2020-06-20 ENCOUNTER — Inpatient Hospital Stay: Payer: 59

## 2020-06-20 ENCOUNTER — Encounter (HOSPITAL_BASED_OUTPATIENT_CLINIC_OR_DEPARTMENT_OTHER)
Admission: RE | Admit: 2020-06-20 | Discharge: 2020-06-20 | Disposition: A | Discharge status: Home / Self Care | Payer: 59 | Source: Ambulatory Visit | Attending: General Surgery | Admitting: General Surgery

## 2020-06-20 ENCOUNTER — Other Ambulatory Visit: Payer: Self-pay | Admitting: General Surgery

## 2020-06-20 DIAGNOSIS — Z01812 Encounter for preprocedural laboratory examination: Secondary | ICD-10-CM | POA: Insufficient documentation

## 2020-06-20 DIAGNOSIS — C50512 Malignant neoplasm of lower-outer quadrant of left female breast: Secondary | ICD-10-CM | POA: Diagnosis not present

## 2020-06-20 DIAGNOSIS — Z20822 Contact with and (suspected) exposure to covid-19: Secondary | ICD-10-CM | POA: Diagnosis not present

## 2020-06-20 DIAGNOSIS — Z171 Estrogen receptor negative status [ER-]: Secondary | ICD-10-CM | POA: Diagnosis not present

## 2020-06-20 DIAGNOSIS — Z01818 Encounter for other preprocedural examination: Secondary | ICD-10-CM | POA: Diagnosis not present

## 2020-06-20 DIAGNOSIS — C50412 Malignant neoplasm of upper-outer quadrant of left female breast: Secondary | ICD-10-CM

## 2020-06-20 DIAGNOSIS — Z808 Family history of malignant neoplasm of other organs or systems: Secondary | ICD-10-CM | POA: Diagnosis not present

## 2020-06-20 DIAGNOSIS — Z801 Family history of malignant neoplasm of trachea, bronchus and lung: Secondary | ICD-10-CM | POA: Diagnosis not present

## 2020-06-20 LAB — BASIC METABOLIC PANEL
Anion gap: 8 (ref 5–15)
BUN: 13 mg/dL (ref 8–23)
CO2: 29 mmol/L (ref 22–32)
Calcium: 9.5 mg/dL (ref 8.9–10.3)
Chloride: 102 mmol/L (ref 98–111)
Creatinine, Ser: 0.86 mg/dL (ref 0.44–1.00)
GFR, Estimated: >60 mL/min (ref >60)
Glucose, Bld: 90 mg/dL (ref 70–99)
Potassium: 5 mmol/L (ref 3.5–5.1)
Sodium: 139 mmol/L (ref 135–145)

## 2020-06-20 LAB — SARS CORONAVIRUS 2 (TAT 6-24 HRS): SARS Coronavirus 2: NEGATIVE

## 2020-06-20 MED ORDER — ENSURE PRE-SURGERY PO LIQD
296.0000 mL | Freq: Once | ORAL | Status: AC
  Administered 2020-06-24: 296 mL via ORAL

## 2020-06-20 NOTE — Progress Notes (Signed)
REFERRING PROVIDER: Sindy Guadeloupe, MD [place],  [place] [address]  PRIMARY PROVIDER:  Derinda Late, MD  PRIMARY REASON FOR VISIT:  1. Malignant neoplasm of lower-outer quadrant of left breast of female, estrogen receptor negative ([place])   2. Family history of brain cancer   3. Family history of lung cancer      HISTORY OF PRESENT ILLNESS:   Anna Stewart, a 65 y.o. female, was seen for a [facility] cancer genetics consultation at the request of Dr. Janese Banks due to a personal and family history of cancer.  [name] presents to clinic today to discuss the possibility of a hereditary predisposition to cancer, genetic testing, and to further clarify her future cancer risks, as well as potential cancer risks for family members.   In 2022, at the age of 65, [name] was diagnosed with left breast cancer, triple negative. The treatment plan includes lumpectomy on Monday 06/24/2020 and adjuvant chemotherapy and radiation.    CANCER HISTORY:  Oncology History  Malignant neoplasm of lower-outer quadrant of left breast of female, estrogen receptor negative ([place])  06/19/2020 Initial Diagnosis   Malignant neoplasm of lower-outer quadrant of left breast of female, estrogen receptor negative ([place])   06/19/2020 Cancer Staging   Staging form: Breast, AJCC 8th Edition - Clinical stage from 06/19/2020: Stage IB (cT1b, cN0, cM0, G3, ER-, PR-, HER2-) - Signed by Sindy Guadeloupe, MD on 06/19/2020 Stage prefix: Initial diagnosis Histologic grading system: 3 grade system      RISK FACTORS:  Menarche was at age 18.  First live birth at age 76.  OCP use: briefly Ovaries intact: yes.  Hysterectomy: no.  Menopausal status: postmenopausal.  HRT use: 0 years. Colonoscopy: yes; normal. Mammogram within the last year: yes. Number of breast biopsies: 2. Up to date with pelvic exams: yes. Any excessive radiation exposure in the past: no  Past Medical History:  Diagnosis Date  .  Allergy   . AR (allergic rhinitis)   . Arthritis   . Family history of brain cancer   . Family history of lung cancer   . Hair loss   . Hypertension    x 5years  . Migraine   . Psoriasis   . Seizure ([place])    x 17years  . Seizure disorder ([place])   . Vitamin D deficiency     Past Surgical History:  Procedure Laterality Date  . BREAST BIOPSY Right    benign ? date-before 2006  . BREAST BIOPSY Left 05/30/2020   [place] bx, coil marker, path pending  . CESAREAN SECTION    . CHOLECYSTECTOMY    . KNEE SURGERY    . TONSILLECTOMY    . TUBAL LIGATION      Social History   Socioeconomic History  . Marital status: Married    Spouse name: Event organiser  . Number of children: 3  . Years of education: College  . Highest education level: Not on file  Occupational History  . Occupation:      Employer: OTHER    Comment: Publishing copy  Tobacco Use  . Smoking status: Never Smoker  . Smokeless tobacco: Never Used  Vaping Use  . Vaping Use: Never used  Substance and Sexual Activity  . Alcohol use: Yes    Comment: Rarely- wine  . Drug use: No  . Sexual activity: Yes    Birth control/protection: Post-menopausal  Other Topics Concern  . Not on file  Social History Narrative   Patient  lives at home with her family.   Caffeine Use: 1 cups of coffee daily   Social Determinants of Health   Financial Resource Strain: Not on file  Food Insecurity: Not on file  Transportation Needs: Not on file  Physical Activity: Not on file  Stress: Not on file  Social Connections: Not on file     FAMILY HISTORY:  We obtained a detailed, 4-generation family history.  Significant diagnoses are listed below: Family History  Problem Relation Age of Onset  . Brain cancer Mother 80       tumor  . Diabetes Father   . Heart disease Father   . Lung cancer Father   . Cancer Paternal Aunt   . Diabetes Maternal Grandmother   . Stroke Maternal Grandfather   . Breast cancer Neg Hx    Ms.  Tarquinio has a son and 2 daughters, no cancers. She has 2 brothers, no cancers.  [name] mother had brain tumor at 57 and passed within weeks of her diagnosis. Patient had 7 maternal uncles. Limited information about this side of the family, an uncle did have leukemia, no other known cancers.  [name] father had lung cancer and died of diabetes at 92. Patient had 1 paternal aunt who had cancer, unknown type. No other known cancers on this side of the family.  [name] is unaware of previous family history of genetic testing for hereditary cancer risks. Patient's maternal ancestors are of [place] descent, and paternal ancestors are of [place] descent. There is no reported Ashkenazi Jewish ancestry. There is no known consanguinity.    GENETIC COUNSELING ASSESSMENT: [name] is a 65 y.o. female with a recent diagnosis of triple negative breast cancer which is somewhat suggestive of a hereditary cancer syndrome and predisposition to cancer. We, therefore, discussed and recommended the following at today's visit.   DISCUSSION: We discussed that approximately 5-10% of breast cancer is hereditary  Most cases of hereditary breast cancer are associated with BRCA1/BRCA2 genes, although there are other genes associated with hereditary breast cancer as well including ATM, CHEK2, PALB2. We discussed that testing is beneficial for several reasons including surgical decision-making for breast cancer, knowing about other cancer risks, identifying potential screening and risk-reduction options that may be appropriate, and to understand if other family members could be at risk for cancer and allow them to undergo genetic testing. [name] would not change her surgical plan based on genetic test results and has surgery scheduled for 3/21.  We reviewed the characteristics, features and inheritance patterns of hereditary cancer syndromes. We also discussed genetic testing, including the  appropriate family members to test, the process of testing, insurance coverage and turn-around-time for results. We discussed the implications of a negative, positive and/or variant of uncertain significant result. We recommended [name] pursue genetic testing for the [facility] Multi-Cancer+RNA gene panel.   The Multi-Cancer Panel + RNA offered by [facility] includes sequencing and/or deletion duplication testing of the following 84 genes: AIP, ALK, APC, ATM, AXIN2,BAP1,  BARD1, BLM, BMPR1A, BRCA1, BRCA2, BRIP1, CASR, CDC73, CDH1, CDK4, CDKN1B, CDKN1C, CDKN2A (p14ARF), CDKN2A (p16INK4a), CEBPA, CHEK2, CTNNA1, DICER1, DIS3L2, EGFR (c.2369C>T, p.Thr790Met variant only), EPCAM (Deletion/duplication testing only), FH, FLCN, GATA2, GPC3, GREM1 (Promoter region deletion/duplication testing only), HOXB13 (c.251G>A, p.Gly84Glu), HRAS, KIT, MAX, MEN1, MET, MITF (c.952G>A, p.Glu318Lys variant only), MLH1, MSH2, MSH3, MSH6, MUTYH, NBN, NF1, NF2, NTHL1, PALB2, PDGFRA, PHOX2B, PMS2, POLD1, POLE, POT1, PRKAR1A, PTCH1, PTEN, RAD50, RAD51C, RAD51D, RB1, RECQL4, RET, RUNX1, SDHAF2, SDHA (sequence  changes only), SDHB, SDHC, SDHD, SMAD4, SMARCA4, SMARCB1, SMARCE1, STK11, SUFU, TERC, TERT, TMEM127, TP53, TSC1, TSC2, VHL, WRN and WT1.  Based on Ms. Gorden's personal history of cancer, she meets medical criteria for genetic testing. Despite that she meets criteria, she may still have an out of pocket cost. We discussed that if her out of pocket cost for testing is over $100, the laboratory will call and confirm whether she wants to proceed with testing.  If the out of pocket cost of testing is less than $100 she will be billed by the genetic testing laboratory.   PLAN: After considering the risks, benefits, and limitations, Ms. Rena provided informed consent to pursue genetic testing and the blood sample was sent to [facility] for analysis of the Multi-Cancer Panel+RNA. Results should be available within  approximately 2-3 weeks' time, at which point they will be disclosed by telephone to Ms. Conaty, as will any additional recommendations warranted by these results. Ms. Dapper will receive a summary of her genetic counseling visit and a copy of her results once available. This information will also be available in Epic.   Ms. Test questions were answered to her satisfaction today. Our contact information was provided should additional questions or concerns arise. Thank you for the referral and allowing [place] to share in the care of your patient.   Faith Rogue, MS, [facility] Genetic Counselor [email or web address] Phone: [phone]  The patient was seen for a total of 30 minutes in face-to-face genetic counseling.  Dr. Grayland Ormond was available for discussion regarding this case.   _______________________________________________________________________ For Office Staff:  Number of people involved in session: 1 Was an Intern/ student involved with case: no

## 2020-06-21 ENCOUNTER — Ambulatory Visit
Admission: RE | Admit: 2020-06-21 | Discharge: 2020-06-21 | Disposition: A | Discharge status: Home / Self Care | Payer: 59 | Source: Ambulatory Visit | Attending: General Surgery | Admitting: General Surgery

## 2020-06-21 DIAGNOSIS — Z171 Estrogen receptor negative status [ER-]: Secondary | ICD-10-CM

## 2020-06-21 DIAGNOSIS — C50412 Malignant neoplasm of upper-outer quadrant of left female breast: Secondary | ICD-10-CM

## 2020-06-24 ENCOUNTER — Ambulatory Visit
Admission: RE | Admit: 2020-06-24 | Discharge: 2020-06-24 | Disposition: A | Discharge status: Home / Self Care | Payer: 59 | Source: Ambulatory Visit | Attending: General Surgery | Admitting: General Surgery

## 2020-06-24 ENCOUNTER — Encounter (HOSPITAL_BASED_OUTPATIENT_CLINIC_OR_DEPARTMENT_OTHER)
Admission: RE | Disposition: A | Discharge status: Home / Self Care | Payer: Self-pay | Source: Home / Self Care | Attending: General Surgery

## 2020-06-24 ENCOUNTER — Ambulatory Visit (HOSPITAL_COMMUNITY)
Admission: RE | Admit: 2020-06-24 | Discharge: 2020-06-24 | Disposition: A | Discharge status: Home / Self Care | Payer: 59 | Source: Ambulatory Visit | Attending: General Surgery | Admitting: General Surgery

## 2020-06-24 ENCOUNTER — Ambulatory Visit (HOSPITAL_BASED_OUTPATIENT_CLINIC_OR_DEPARTMENT_OTHER)
Admission: RE | Admit: 2020-06-24 | Discharge: 2020-06-24 | Disposition: A | Discharge status: Home / Self Care | Payer: 59 | Attending: General Surgery | Admitting: General Surgery

## 2020-06-24 ENCOUNTER — Ambulatory Visit (HOSPITAL_COMMUNITY): Payer: 59

## 2020-06-24 ENCOUNTER — Ambulatory Visit (HOSPITAL_BASED_OUTPATIENT_CLINIC_OR_DEPARTMENT_OTHER): Payer: 59 | Admitting: Anesthesiology

## 2020-06-24 ENCOUNTER — Other Ambulatory Visit: Payer: Self-pay

## 2020-06-24 ENCOUNTER — Encounter (HOSPITAL_BASED_OUTPATIENT_CLINIC_OR_DEPARTMENT_OTHER): Payer: Self-pay | Admitting: General Surgery

## 2020-06-24 DIAGNOSIS — Z8261 Family history of arthritis: Secondary | ICD-10-CM | POA: Insufficient documentation

## 2020-06-24 DIAGNOSIS — Z809 Family history of malignant neoplasm, unspecified: Secondary | ICD-10-CM | POA: Diagnosis not present

## 2020-06-24 DIAGNOSIS — C50512 Malignant neoplasm of lower-outer quadrant of left female breast: Secondary | ICD-10-CM | POA: Insufficient documentation

## 2020-06-24 DIAGNOSIS — Z888 Allergy status to other drugs, medicaments and biological substances status: Secondary | ICD-10-CM | POA: Insufficient documentation

## 2020-06-24 DIAGNOSIS — Z8249 Family history of ischemic heart disease and other diseases of the circulatory system: Secondary | ICD-10-CM | POA: Insufficient documentation

## 2020-06-24 DIAGNOSIS — C50412 Malignant neoplasm of upper-outer quadrant of left female breast: Secondary | ICD-10-CM

## 2020-06-24 DIAGNOSIS — Z79899 Other long term (current) drug therapy: Secondary | ICD-10-CM | POA: Insufficient documentation

## 2020-06-24 DIAGNOSIS — Z171 Estrogen receptor negative status [ER-]: Secondary | ICD-10-CM

## 2020-06-24 DIAGNOSIS — C50919 Malignant neoplasm of unspecified site of unspecified female breast: Secondary | ICD-10-CM

## 2020-06-24 DIAGNOSIS — Z836 Family history of other diseases of the respiratory system: Secondary | ICD-10-CM | POA: Insufficient documentation

## 2020-06-24 DIAGNOSIS — C50912 Malignant neoplasm of unspecified site of left female breast: Secondary | ICD-10-CM | POA: Diagnosis present

## 2020-06-24 DIAGNOSIS — Z419 Encounter for procedure for purposes other than remedying health state, unspecified: Secondary | ICD-10-CM

## 2020-06-24 HISTORY — PX: PORTACATH PLACEMENT: SHX2246

## 2020-06-24 HISTORY — PX: BREAST LUMPECTOMY WITH RADIOACTIVE SEED AND SENTINEL LYMPH NODE BIOPSY: SHX6550

## 2020-06-24 SURGERY — BREAST LUMPECTOMY WITH RADIOACTIVE SEED AND SENTINEL LYMPH NODE BIOPSY
Anesthesia: Regional | Site: Chest | Laterality: Right

## 2020-06-24 MED ORDER — HEPARIN (PORCINE) IN NACL 1000-0.9 UT/500ML-% IV SOLN
INTRAVENOUS | Status: AC
  Filled 2020-06-24: qty 500

## 2020-06-24 MED ORDER — ACETAMINOPHEN 500 MG PO TABS: 1000.0000 mg | ORAL_TABLET | ORAL | Status: DC

## 2020-06-24 MED ORDER — ONDANSETRON HCL 4 MG/2ML IJ SOLN
INTRAMUSCULAR | Status: DC | PRN
  Administered 2020-06-24: 4 mg via INTRAVENOUS

## 2020-06-24 MED ORDER — BUPIVACAINE LIPOSOME 1.3 % IJ SUSP
INTRAMUSCULAR | Status: DC | PRN
  Administered 2020-06-24: 10 mL via PERINEURAL

## 2020-06-24 MED ORDER — HEPARIN SOD (PORK) LOCK FLUSH 100 UNIT/ML IV SOLN
INTRAVENOUS | Status: AC
  Filled 2020-06-24: qty 5

## 2020-06-24 MED ORDER — PROMETHAZINE HCL 25 MG/ML IJ SOLN: 6.2500 mg | INTRAMUSCULAR | Status: DC | PRN

## 2020-06-24 MED ORDER — OXYCODONE HCL 5 MG PO TABS: 5.0000 mg | ORAL_TABLET | Freq: Four times a day (QID) | ORAL | 0 refills | Status: DC | PRN

## 2020-06-24 MED ORDER — ACETAMINOPHEN 500 MG PO TABS
1000.0000 mg | ORAL_TABLET | Freq: Once | ORAL | Status: AC
  Administered 2020-06-24: 1000 mg via ORAL

## 2020-06-24 MED ORDER — SCOPOLAMINE 1 MG/3DAYS TD PT72
1.0000 | MEDICATED_PATCH | TRANSDERMAL | Status: DC
  Administered 2020-06-24: 1.5 mg via TRANSDERMAL

## 2020-06-24 MED ORDER — HYDROMORPHONE HCL 1 MG/ML IJ SOLN: 0.2500 mg | INTRAMUSCULAR | Status: DC | PRN

## 2020-06-24 MED ORDER — SCOPOLAMINE 1 MG/3DAYS TD PT72
MEDICATED_PATCH | TRANSDERMAL | Status: AC
  Filled 2020-06-24: qty 1

## 2020-06-24 MED ORDER — FENTANYL CITRATE (PF) 100 MCG/2ML IJ SOLN
INTRAMUSCULAR | Status: DC | PRN
  Administered 2020-06-24: 50 ug via INTRAVENOUS
  Administered 2020-06-24: 25 ug via INTRAVENOUS

## 2020-06-24 MED ORDER — KETOROLAC TROMETHAMINE 15 MG/ML IJ SOLN
15.0000 mg | INTRAMUSCULAR | Status: AC
  Administered 2020-06-24: 15 mg via INTRAVENOUS

## 2020-06-24 MED ORDER — METHYLENE BLUE 0.5 % INJ SOLN
INTRAVENOUS | Status: AC
  Filled 2020-06-24: qty 10

## 2020-06-24 MED ORDER — ROCURONIUM BROMIDE 10 MG/ML (PF) SYRINGE
PREFILLED_SYRINGE | INTRAVENOUS | Status: AC
  Filled 2020-06-24: qty 10

## 2020-06-24 MED ORDER — MIDAZOLAM HCL 2 MG/2ML IJ SOLN
2.0000 mg | Freq: Once | INTRAMUSCULAR | Status: AC
  Administered 2020-06-24: 2 mg via INTRAVENOUS

## 2020-06-24 MED ORDER — ACETAMINOPHEN 500 MG PO TABS
ORAL_TABLET | ORAL | Status: AC
  Filled 2020-06-24: qty 2

## 2020-06-24 MED ORDER — KETOROLAC TROMETHAMINE 15 MG/ML IJ SOLN
INTRAMUSCULAR | Status: AC
  Filled 2020-06-24: qty 1

## 2020-06-24 MED ORDER — MIDAZOLAM HCL 2 MG/2ML IJ SOLN
INTRAMUSCULAR | Status: AC
  Filled 2020-06-24: qty 2

## 2020-06-24 MED ORDER — FENTANYL CITRATE (PF) 100 MCG/2ML IJ SOLN
INTRAMUSCULAR | Status: AC
  Filled 2020-06-24: qty 2

## 2020-06-24 MED ORDER — BUPIVACAINE HCL (PF) 0.25 % IJ SOLN
INTRAMUSCULAR | Status: DC | PRN
  Administered 2020-06-24: 7 mL

## 2020-06-24 MED ORDER — CEFAZOLIN SODIUM-DEXTROSE 2-4 GM/100ML-% IV SOLN
2.0000 g | INTRAVENOUS | Status: AC
  Administered 2020-06-24: 2 g via INTRAVENOUS

## 2020-06-24 MED ORDER — ROCURONIUM BROMIDE 100 MG/10ML IV SOLN
INTRAVENOUS | Status: DC | PRN
  Administered 2020-06-24: 100 mg via INTRAVENOUS

## 2020-06-24 MED ORDER — LACTATED RINGERS IV SOLN: INTRAVENOUS | Status: DC

## 2020-06-24 MED ORDER — ONDANSETRON HCL 4 MG/2ML IJ SOLN
INTRAMUSCULAR | Status: AC
  Filled 2020-06-24: qty 2

## 2020-06-24 MED ORDER — PROPOFOL 10 MG/ML IV BOLUS
INTRAVENOUS | Status: AC
  Filled 2020-06-24: qty 20

## 2020-06-24 MED ORDER — HEPARIN (PORCINE) IN NACL 2-0.9 UNITS/ML
INTRAMUSCULAR | Status: AC | PRN
  Administered 2020-06-24: 1 via INTRAVENOUS

## 2020-06-24 MED ORDER — OXYCODONE HCL 5 MG PO TABS
5.0000 mg | ORAL_TABLET | Freq: Once | ORAL | Status: AC | PRN
Start: 2020-06-24 — End: 2020-06-24
  Administered 2020-06-24: 5 mg via ORAL

## 2020-06-24 MED ORDER — PHENYLEPHRINE 40 MCG/ML (10ML) SYRINGE FOR IV PUSH (FOR BLOOD PRESSURE SUPPORT)
PREFILLED_SYRINGE | INTRAVENOUS | Status: AC
  Filled 2020-06-24: qty 10

## 2020-06-24 MED ORDER — CEFAZOLIN SODIUM-DEXTROSE 2-4 GM/100ML-% IV SOLN
INTRAVENOUS | Status: AC
  Filled 2020-06-24: qty 100

## 2020-06-24 MED ORDER — DEXAMETHASONE SODIUM PHOSPHATE 4 MG/ML IJ SOLN
INTRAMUSCULAR | Status: DC | PRN
  Administered 2020-06-24: 5 mg via INTRAVENOUS

## 2020-06-24 MED ORDER — FENTANYL CITRATE (PF) 100 MCG/2ML IJ SOLN
100.0000 ug | Freq: Once | INTRAMUSCULAR | Status: AC
  Administered 2020-06-24: 50 ug via INTRAVENOUS

## 2020-06-24 MED ORDER — BUPIVACAINE HCL (PF) 0.5 % IJ SOLN
INTRAMUSCULAR | Status: DC | PRN
  Administered 2020-06-24: 20 mL via PERINEURAL

## 2020-06-24 MED ORDER — OXYCODONE HCL 5 MG PO TABS
ORAL_TABLET | ORAL | Status: AC
  Filled 2020-06-24: qty 1

## 2020-06-24 MED ORDER — TECHNETIUM TC 99M TILMANOCEPT KIT
1.0000 | PACK | Freq: Once | INTRAVENOUS | Status: AC | PRN
  Administered 2020-06-24: 1 via INTRADERMAL

## 2020-06-24 MED ORDER — PROPOFOL 10 MG/ML IV BOLUS
INTRAVENOUS | Status: DC | PRN
  Administered 2020-06-24: 150 mg via INTRAVENOUS

## 2020-06-24 MED ORDER — OXYCODONE HCL 5 MG/5ML PO SOLN
5.0000 mg | Freq: Once | ORAL | Status: AC | PRN
Start: 2020-06-24 — End: 2020-06-24

## 2020-06-24 MED ORDER — BUPIVACAINE HCL (PF) 0.25 % IJ SOLN
INTRAMUSCULAR | Status: AC
  Filled 2020-06-24: qty 30

## 2020-06-24 MED ORDER — PHENYLEPHRINE HCL (PRESSORS) 10 MG/ML IV SOLN
INTRAVENOUS | Status: DC | PRN
  Administered 2020-06-24 (×3): 40 ug via INTRAVENOUS

## 2020-06-24 MED ORDER — HEPARIN SOD (PORK) LOCK FLUSH 100 UNIT/ML IV SOLN
INTRAVENOUS | Status: DC | PRN
  Administered 2020-06-24: 500 [IU]

## 2020-06-24 MED ORDER — LIDOCAINE HCL (CARDIAC) PF 100 MG/5ML IV SOSY
PREFILLED_SYRINGE | INTRAVENOUS | Status: DC | PRN
  Administered 2020-06-24: 20 mg via INTRAVENOUS

## 2020-06-24 MED ORDER — SUGAMMADEX SODIUM 200 MG/2ML IV SOLN
INTRAVENOUS | Status: DC | PRN
  Administered 2020-06-24: 200 mg via INTRAVENOUS

## 2020-06-24 SURGICAL SUPPLY — 71 items
APPLIER CLIP 9.375 MED OPEN (MISCELLANEOUS)
BAG DECANTER FOR FLEXI CONT (MISCELLANEOUS) ×3 IMPLANT
BENZOIN TINCTURE PRP APPL 2/3 (GAUZE/BANDAGES/DRESSINGS) ×3 IMPLANT
BINDER BREAST LRG (GAUZE/BANDAGES/DRESSINGS) IMPLANT
BINDER BREAST MEDIUM (GAUZE/BANDAGES/DRESSINGS) IMPLANT
BINDER BREAST XLRG (GAUZE/BANDAGES/DRESSINGS) ×3 IMPLANT
BINDER BREAST XXLRG (GAUZE/BANDAGES/DRESSINGS) IMPLANT
BLADE SURG 11 STRL SS (BLADE) ×3 IMPLANT
BLADE SURG 15 STRL LF DISP TIS (BLADE) ×2 IMPLANT
BLADE SURG 15 STRL SS (BLADE) ×1
CANISTER SUC SOCK COL 7IN (MISCELLANEOUS) IMPLANT
CANISTER SUCT 1200ML W/VALVE (MISCELLANEOUS) IMPLANT
CHLORAPREP W/TINT 26 (MISCELLANEOUS) ×3 IMPLANT
CLIP APPLIE 9.375 MED OPEN (MISCELLANEOUS) IMPLANT
CLIP VESOCCLUDE SM WIDE 6/CT (CLIP) ×3 IMPLANT
COVER BACK TABLE 60X90IN (DRAPES) ×3 IMPLANT
COVER MAYO STAND STRL (DRAPES) ×3 IMPLANT
COVER PROBE 5X48 (MISCELLANEOUS)
COVER PROBE W GEL 5X96 (DRAPES) ×3 IMPLANT
COVER WAND RF STERILE (DRAPES) IMPLANT
DECANTER SPIKE VIAL GLASS SM (MISCELLANEOUS) IMPLANT
DERMABOND ADVANCED (GAUZE/BANDAGES/DRESSINGS) ×1
DERMABOND ADVANCED .7 DNX12 (GAUZE/BANDAGES/DRESSINGS) ×2 IMPLANT
DRAPE C-ARM 42X72 X-RAY (DRAPES) ×3 IMPLANT
DRAPE LAPAROSCOPIC ABDOMINAL (DRAPES) ×3 IMPLANT
DRAPE UTILITY XL STRL (DRAPES) ×3 IMPLANT
DRSG TEGADERM 4X4.75 (GAUZE/BANDAGES/DRESSINGS) IMPLANT
ELECT COATED BLADE 2.86 ST (ELECTRODE) ×3 IMPLANT
ELECT REM PT RETURN 9FT ADLT (ELECTROSURGICAL) ×3
ELECTRODE REM PT RTRN 9FT ADLT (ELECTROSURGICAL) ×2 IMPLANT
GAUZE SPONGE 4X4 12PLY STRL LF (GAUZE/BANDAGES/DRESSINGS) ×3 IMPLANT
GLOVE SURG ENC MOIS LTX SZ7 (GLOVE) ×9 IMPLANT
GLOVE SURG POLYISO LF SZ8 (GLOVE) ×3 IMPLANT
GLOVE SURG UNDER POLY LF SZ7 (GLOVE) ×3 IMPLANT
GLOVE SURG UNDER POLY LF SZ7.5 (GLOVE) ×6 IMPLANT
GOWN STRL REUS W/ TWL LRG LVL3 (GOWN DISPOSABLE) ×4 IMPLANT
GOWN STRL REUS W/TWL 2XL LVL3 (GOWN DISPOSABLE) ×3 IMPLANT
GOWN STRL REUS W/TWL LRG LVL3 (GOWN DISPOSABLE) ×2
HEMOSTAT ARISTA ABSORB 3G PWDR (HEMOSTASIS) IMPLANT
IV KIT MINILOC 20X1 SAFETY (NEEDLE) IMPLANT
KIT CVR 48X5XPRB PLUP LF (MISCELLANEOUS) IMPLANT
KIT MARKER MARGIN INK (KITS) ×3 IMPLANT
KIT PORT POWER 8FR ISP CVUE (Port) ×3 IMPLANT
NDL SAFETY ECLIPSE 18X1.5 (NEEDLE) IMPLANT
NEEDLE HYPO 18GX1.5 SHARP (NEEDLE)
NEEDLE HYPO 25X1 1.5 SAFETY (NEEDLE) ×3 IMPLANT
NS IRRIG 1000ML POUR BTL (IV SOLUTION) IMPLANT
PACK BASIN DAY SURGERY FS (CUSTOM PROCEDURE TRAY) ×3 IMPLANT
PENCIL SMOKE EVACUATOR (MISCELLANEOUS) ×3 IMPLANT
RETRACTOR ONETRAX LX 90X20 (MISCELLANEOUS) IMPLANT
SLEEVE SCD COMPRESS KNEE MED (STOCKING) ×3 IMPLANT
SPONGE LAP 4X18 RFD (DISPOSABLE) ×3 IMPLANT
STRIP CLOSURE SKIN 1/2X4 (GAUZE/BANDAGES/DRESSINGS) ×3 IMPLANT
SUT ETHILON 2 0 FS 18 (SUTURE) IMPLANT
SUT MNCRL AB 4-0 PS2 18 (SUTURE) ×9 IMPLANT
SUT MON AB 5-0 PS2 18 (SUTURE) IMPLANT
SUT PROLENE 2 0 SH DA (SUTURE) ×3 IMPLANT
SUT SILK 2 0 SH (SUTURE) ×3 IMPLANT
SUT SILK 2 0 TIES 17X18 (SUTURE)
SUT SILK 2-0 18XBRD TIE BLK (SUTURE) IMPLANT
SUT VIC AB 2-0 SH 27 (SUTURE) ×3
SUT VIC AB 2-0 SH 27XBRD (SUTURE) ×6 IMPLANT
SUT VIC AB 3-0 SH 27 (SUTURE) ×3
SUT VIC AB 3-0 SH 27X BRD (SUTURE) ×6 IMPLANT
SUT VIC AB 5-0 PS2 18 (SUTURE) IMPLANT
SYR 5ML LUER SLIP (SYRINGE) ×3 IMPLANT
SYR CONTROL 10ML LL (SYRINGE) ×3 IMPLANT
TOWEL GREEN STERILE FF (TOWEL DISPOSABLE) ×3 IMPLANT
TRAY FAXITRON CT DISP (TRAY / TRAY PROCEDURE) ×3 IMPLANT
TUBE CONNECTING 20X1/4 (TUBING) IMPLANT
YANKAUER SUCT BULB TIP NO VENT (SUCTIONS) IMPLANT

## 2020-06-24 NOTE — Interval H&P Note (Signed)
History and Physical Interval Note:  06/24/2020 11:07 AM  Anna Stewart  has presented today for surgery, with the diagnosis of LEFT BREAST CANCER.  The various methods of treatment have been discussed with the patient and family. After consideration of risks, benefits and other options for treatment, the patient has consented to  Procedure(s) with comments: LEFT BREAST LUMPECTOMY WITH RADIOACTIVE SEED AND LEFT AXILLARY SENTINEL LYMPH NODE BIOPSY (Left) - PEC BLOCK; START TIME OF 10:45 AM FOR 120 MINUTES ROOM 1 Sai Zinn IQ INSERTION PORT-A-CATH (N/A) as a surgical intervention.  The patient's history has been reviewed, patient examined, no change in status, stable for surgery.  I have reviewed the patient's chart and labs.  Questions were answered to the patient's satisfaction.     Rolm Bookbinder

## 2020-06-24 NOTE — Anesthesia Postprocedure Evaluation (Signed)
Anesthesia Post Note  Patient: Anna Stewart  Procedure(s) Performed: LEFT BREAST LUMPECTOMY WITH RADIOACTIVE SEED AND LEFT AXILLARY SENTINEL LYMPH NODE BIOPSY (Left Breast) INSERTION PORT-A-CATH (Right Chest)     Patient location during evaluation: PACU Anesthesia Type: Regional and General Level of consciousness: awake and alert Pain management: pain level controlled Vital Signs Assessment: post-procedure vital signs reviewed and stable Respiratory status: spontaneous breathing, nonlabored ventilation and respiratory function stable Cardiovascular status: blood pressure returned to baseline and stable Postop Assessment: no apparent nausea or vomiting Anesthetic complications: no   No complications documented.  Last Vitals:  Vitals:   06/24/20 1310 06/24/20 1315  BP: 110/85 123/81  Pulse: 92 91  Resp: 17 18  Temp: 36.6 C   SpO2: 100% 99%    Last Pain:  Vitals:   06/24/20 1310  PainSc: 0-No pain                 Jarome Matin Kodah Maret

## 2020-06-24 NOTE — H&P (Signed)
65 yof with prior benign breast biopsy, no fh breast cancer who presents after screening mm. she has c density breasts. she was noted to have a 1 cm left breast mass. on [place] there is a 7 mm mass. nodes are mostly normal- there is one with borderline cortical thickness. 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 this is from outside lab and no Ki or further differentiation of tumor. she is here with her husband today to discuss options.   Past Surgical History Janeann Forehand, CNA; 06/12/2020 2:50 PM) Breast Biopsy  Bilateral. Cesarean Section - 1  Gallbladder Surgery - Laparoscopic  Knee Surgery  Left. Oral Surgery  Tonsillectomy   Diagnostic Studies History Janeann Forehand, CNA; 06/12/2020 2:50 PM) Colonoscopy  1-5 years ago Mammogram  within last year Pap Smear  >5 years ago  Allergies Janeann Forehand, CNA; 06/12/2020 2:51 PM) Keppra *ANTICONVULSANTS*  TEGretol-XR *ANTICONVULSANTS*  Allergies Reconciled   Medication History Janeann Forehand, CNA; 06/12/2020 2:51 PM) Fluticasone Propionate (50MCG/ACT Suspension, Nasal) Active. Lisinopril-hydroCHLOROthiazide (10-12.5MG  Tablet, Oral) Active. Meloxicam (15MG  Tablet, Oral) Active. Pantoprazole Sodium (40MG  Tablet DR, Oral) Active. HYDROcodone-Acetaminophen (5-325MG  Tablet, Oral) Active. lamoTRIgine ER (200MG  Tablet ER 24HR, Oral) Active. Medications Reconciled  Social History Janeann Forehand, CNA; 06/12/2020 2:50 PM) Alcohol use  Occasional alcohol use. Caffeine use  Carbonated beverages, Coffee, Tea. No drug use  Tobacco use  Never smoker.  Family History Janeann Forehand, CNA; 06/12/2020 2:50 PM) Arthritis  Brother, Mother. Cancer  Mother. Heart Disease  Father. Hypertension  Father, Mother. Migraine Headache  Mother. Respiratory Condition  Father.  Pregnancy / Birth History Janeann Forehand, CNA; 06/12/2020 2:50 PM) Age at menarche  18  years. Age of menopause  [age] Contraceptive History  Oral contraceptives. Gravida  3 Length (months) of breastfeeding  7-12 Maternal age  64-30 Para  45  Other Problems Janeann Forehand, CNA; 06/12/2020 2:50 PM) Arthritis  Cholelithiasis  High blood pressure  Migraine Headache  Seizure Disorder    Review of Systems Janeann Forehand CNA; 06/12/2020 2:50 PM) General Not Present- Appetite Loss, Chills, Fatigue, Fever, Night Sweats, Weight Gain and Weight Loss. Skin Not Present- Change in Wart/Mole, Dryness, Hives, Jaundice, New Lesions, Non-Healing Wounds, Rash and Ulcer. HEENT Present- Seasonal Allergies and Wears glasses/contact lenses. Not Present- Earache, Hearing Loss, Hoarseness, Nose Bleed, Oral Ulcers, Ringing in the Ears, Sinus Pain, Sore Throat, Visual Disturbances and Yellow Eyes. Respiratory Present- Snoring. Not Present- Bloody sputum, Chronic Cough, Difficulty Breathing and Wheezing. Breast Present- Breast Mass. Not Present- Breast Pain, Nipple Discharge and Skin Changes. Cardiovascular Not Present- Chest Pain, Difficulty Breathing Lying Down, Leg Cramps, Palpitations, Rapid Heart Rate, Shortness of Breath and Swelling of Extremities. Gastrointestinal Not Present- Abdominal Pain, Bloating, Bloody Stool, Change in Bowel Habits, Chronic diarrhea, Constipation, Difficulty Swallowing, Excessive gas, Gets full quickly at meals, Hemorrhoids, Indigestion, Nausea, Rectal Pain and Vomiting. Female Genitourinary Not Present- Frequency, Nocturia, Painful Urination, Pelvic Pain and Urgency. Musculoskeletal Present- Joint Pain. Not Present- Back Pain, Joint Stiffness, Muscle Pain, Muscle Weakness and Swelling of Extremities. Neurological Not Present- Decreased Memory, Fainting, Headaches, Numbness, Seizures, Tingling, Tremor, Trouble walking and Weakness. Endocrine Not Present- Cold Intolerance, Excessive Hunger, Hair Changes, Heat Intolerance, Hot flashes and New Diabetes.  Vitals  (Donyelle Alston CNA; 06/12/2020 2:52 PM) 06/12/2020 2:51 PM Weight: 187 lb Height: 68in Body Surface Area: 1.99 m Body Mass Index: 28.43 kg/m  Temp.: 97.60F  Pulse: 136 (Regular)  P.OX: 96% (Room air) BP:  110/70(Sitting, Left Arm, Standard) Physical Exam Rolm Bookbinder MD; 06/12/2020 5:58 PM) General Mental Status-Alert. Orientation-Oriented X3. Breast Nipples-No Discharge. Breast Lump-No Palpable Breast Mass. Lymphatic Head & Neck General Head & Neck Lymphatics: Bilateral - Description - Normal. Axillary General Axillary Region: Bilateral - Description - Normal. Note: no [place] adenopathy   Assessment & Plan Rolm Bookbinder MD; 06/12/2020 6:03 PM) BREAST CANCER OF LOWER-OUTER QUADRANT OF LEFT FEMALE BREAST (C50.512) Story: referral med onc, genetics, sozo, left lumpectomy/ax sn biopsy and port We discussed the staging and pathophysiology of breast cancer. We discussed all of the different options for treatment for breast cancer including surgery, chemotherapy, radiation therapy, Herceptin, and antiestrogen therapy. We discussed a sentinel lymph node biopsy as she does not appear to having lymph node involvement right now. We discussed the performance of that with injection of radioactive tracer. We discussed that there is a chance of having a positive node with a sentinel lymph node biopsy and we will await the permanent pathology to make any other first further decisions in terms of her treatment. We discussed up to a 5% risk lifetime of chronic shoulder pain as well as lymphedema associated with a sentinel lymph node biopsy. We discussed the options for treatment of the breast cancer which included lumpectomy versus a mastectomy. We discussed the performance of the lumpectomy with radioactive seed placement. We discussed a 5-10% chance of a positive margin requiring reexcision in the operating room. We also discussed that she will likely need radiation therapy if she  undergoes lumpectomy. We discussed mastectomy and the postoperative care for that as well. Mastectomy can be followed by reconstruction. The decision for lumpectomy vs mastectomy has no impact on decision for chemotherapy. Most mastectomy patients will not need radiation therapy. We discussed that there is no difference in her survival whether she undergoes lumpectomy with radiation therapy or antiestrogen therapy versus a mastectomy. There is also no real difference between her recurrence in the breast. We discussed the risks of operation including bleeding, infection, possible reoperation. She understands her further therapy will be based on what her stages at the time of her operation.

## 2020-06-24 NOTE — Anesthesia Preprocedure Evaluation (Addendum)
Anesthesia Evaluation  Patient identified by MRN, date of birth, ID band Patient awake    Reviewed: Allergy & Precautions, NPO status , Patient's Chart, lab work & pertinent test results  Airway Mallampati: II  TM Distance: >3 FB Neck ROM: Full    Dental no notable dental hx.    Pulmonary neg pulmonary ROS,    Pulmonary exam normal breath sounds clear to auscultation       Cardiovascular hypertension, Pt. on medications Normal cardiovascular exam Rhythm:Regular Rate:Normal     Neuro/Psych  Headaches, Seizures -, Well Controlled,  PSYCHIATRIC DISORDERS Depression    GI/Hepatic Neg liver ROS, GERD  Medicated and Controlled,  Endo/Other  negative endocrine ROS  Renal/GU negative Renal ROS  negative genitourinary   Musculoskeletal  (+) Arthritis , Osteoarthritis,    Abdominal   Peds  Hematology negative hematology ROS (+)   Anesthesia Other Findings L breast ca   Reproductive/Obstetrics negative OB ROS                            Anesthesia Physical Anesthesia Plan  ASA: II  Anesthesia Plan: General and Regional   Post-op Pain Management: GA combined w/ Regional for post-op pain   Induction: Intravenous  PONV Risk Score and Plan: 3 and Ondansetron, Dexamethasone, Midazolam and Treatment may vary due to age or medical condition  Airway Management Planned: Oral ETT  Additional Equipment: None  Intra-op Plan:   Post-operative Plan: Extubation in OR  Informed Consent: I have reviewed the patients History and Physical, chart, labs and discussed the procedure including the risks, benefits and alternatives for the proposed anesthesia with the patient or authorized representative who has indicated his/her understanding and acceptance.     Dental advisory given  Plan Discussed with: CRNA  Anesthesia Plan Comments:       Anesthesia Quick Evaluation

## 2020-06-24 NOTE — Discharge Instructions (Signed)
[facility] Office Phone Number [phone]  BREAST BIOPSY/ PARTIAL MASTECTOMY: POST OP INSTRUCTIONS Take 400 mg of ibuprofen every 8 hours or 650 mg tylenol every 6 hours for next 72 hours then as needed. Use ice several times daily also. Always review your discharge instruction sheet given to you by the facility where your surgery was performed.  IF YOU HAVE DISABILITY OR FAMILY LEAVE FORMS, YOU MUST BRING THEM TO THE OFFICE FOR PROCESSING.  DO NOT GIVE THEM TO YOUR DOCTOR.  1. A prescription for pain medication may be given to you upon discharge.  Take your pain medication as prescribed, if needed.  If narcotic pain medicine is not needed, then you may take acetaminophen (Tylenol), naprosyn (Alleve) or ibuprofen (Advil) as needed. 2. Take your usually prescribed medications unless otherwise directed 3. If you need a refill on your pain medication, please contact your pharmacy.  They will contact our office to request authorization.  Prescriptions will not be filled after 5pm or on week-ends. 4. You should eat very light the first 24 hours after surgery, such as soup, crackers, pudding, etc.  Resume your normal diet the day after surgery. 5. Most patients will experience some swelling and bruising in the breast.  Ice packs and a good support bra will help.  Wear the breast binder provided or a sports bra for 72 hours day and night.  After that wear a sports bra during the day until you return to the office. Swelling and bruising can take several days to resolve.  6. It is common to experience some constipation if taking pain medication after surgery.  Increasing fluid intake and taking a stool softener will usually help or prevent this problem from occurring.  A mild laxative (Milk of Magnesia or Miralax) should be taken according to package directions if there are no bowel movements after 48 hours. 7. Unless discharge instructions indicate otherwise, you may remove your bandages 48  hours after surgery and you may shower at that time.  You may have steri-strips (small skin tapes) in place directly over the incision.  These strips should be left on the skin for 7-10 days and will come off on their own.  If your surgeon used skin glue on the incision, you may shower in 24 hours.  The glue will flake off over the next 2-3 weeks.  Any sutures or staples will be removed at the office during your follow-up visit. 8. ACTIVITIES:  You may resume regular daily activities (gradually increasing) beginning the next day.  Wearing a good support bra or sports bra minimizes pain and swelling.  You may have sexual intercourse when it is comfortable. a. You may drive when you no longer are taking prescription pain medication, you can comfortably wear a seatbelt, and you can safely maneuver your car and apply brakes. b. RETURN TO WORK:  ______________________________________________________________________________________ 9. You should see your doctor in the office for a follow-up appointment approximately two weeks after your surgery.  Your doctor's nurse will typically make your follow-up appointment when she calls you with your pathology report.  Expect your pathology report 3-4 business days after your surgery.  You may call to check if you do not hear from us after three days. 10. OTHER INSTRUCTIONS: _______________________________________________________________________________________________ _____________________________________________________________________________________________________________________________________ _____________________________________________________________________________________________________________________________________ _____________________________________________________________________________________________________________________________________  WHEN TO CALL DR WAKEFIELD: 1. Fever over 101.0 2. Nausea and/or vomiting. 3. Extreme swelling or  bruising. 4. Continued bleeding from incision. 5. Increased pain, redness, or drainage from the incision.  The clinic   staff is available to answer your questions during regular business hours.  Please don't hesitate to call and ask to speak to one of the nurses for clinical concerns.  If you have a medical emergency, go to the nearest emergency room or call 911.  A surgeon from [facility] is always on call at the hospital.  For further questions, please visit [email or web address] May take Tylenol and/or Ibuprofen after 3:20pm, if needed.    Post Anesthesia Home Care Instructions  Activity: Get plenty of rest for the remainder of the day. A responsible individual must stay with you for 24 hours following the procedure.  For the next 24 hours, DO NOT: -Drive a car -Paediatric nurse -Drink alcoholic beverages -Take any medication unless instructed by your physician -Make any legal decisions or sign important papers.  Meals: Start with liquid foods such as gelatin or soup. Progress to regular foods as tolerated. Avoid greasy, spicy, heavy foods. If nausea and/or vomiting occur, drink only clear liquids until the nausea and/or vomiting subsides. Call your physician if vomiting continues.  Special Instructions/Symptoms: Your throat may feel dry or sore from the anesthesia or the breathing tube placed in your throat during surgery. If this causes discomfort, gargle with warm salt water. The discomfort should disappear within 24 hours.  If you had a scopolamine patch placed behind your ear for the management of post- operative nausea and/or vomiting:  1. The medication in the patch is effective for 72 hours, after which it should be removed.  Wrap patch in a tissue and discard in the trash. Wash hands thoroughly with soap and water. 2. You may remove the patch earlier than 72 hours if you experience unpleasant side effects which may include dry mouth, dizziness or  visual disturbances. 3. Avoid touching the patch. Wash your hands with soap and water after contact with the patch.    Regional Anesthesia Blocks  1. Numbness or the inability to move the "blocked" extremity may last from 3-48 hours after placement. The length of time depends on the medication injected and your individual response to the medication. If the numbness is not going away after 48 hours, call your surgeon.  2. The extremity that is blocked will need to be protected until the numbness is gone and the  Strength has returned. Because you cannot feel it, you will need to take extra care to avoid injury. Because it may be weak, you may have difficulty moving it or using it. You may not know what position it is in without looking at it while the block is in effect.  3. For blocks in the legs and feet, returning to weight bearing and walking needs to be done carefully. You will need to wait until the numbness is entirely gone and the strength has returned. You should be able to move your leg and foot normally before you try and bear weight or walk. You will need someone to be with you when you first try to ensure you do not fall and possibly risk injury.  4. Bruising and tenderness at the needle site are common side effects and will resolve in a few days.  5. Persistent numbness or new problems with movement should be communicated to the surgeon or the [place] [phone] [place] [phone]).Information for Discharge Teaching: EXPAREL (bupivacaine liposome injectable suspension)   Your surgeon or anesthesiologist gave you EXPAREL(bupivacaine) to help control your pain after surgery.   EXPAREL is a local  anesthetic that provides pain relief by numbing the tissue around the surgical site.  EXPAREL is designed to release pain medication over time and can control pain for up to 72 hours.  Depending on how you respond to EXPAREL, you may require less pain  medication during your recovery.  Possible side effects:  Temporary loss of sensation or ability to move in the area where bupivacaine was injected.  Nausea, vomiting, constipation  Rarely, numbness and tingling in your mouth or lips, lightheadedness, or anxiety may occur.  Call your doctor right away if you think you may be experiencing any of these sensations, or if you have other questions regarding possible side effects.  Follow all other discharge instructions given to you by your surgeon or nurse. Eat a healthy diet and drink plenty of water or other fluids.  If you return to the hospital for any reason within 96 hours following the administration of EXPAREL, it is important for health care providers to know that you have received this anesthetic. A teal colored band has been placed on your arm with the date, time and amount of EXPAREL you have received in order to alert and inform your health care providers. Please leave this armband in place for the full 96 hours following administration, and then you may remove the band.

## 2020-06-24 NOTE — Transfer of Care (Signed)
Immediate Anesthesia Transfer of Care Note  Patient: Anna Stewart  Procedure(s) Performed: LEFT BREAST LUMPECTOMY WITH RADIOACTIVE SEED AND LEFT AXILLARY SENTINEL LYMPH NODE BIOPSY (Left Breast) INSERTION PORT-A-CATH (Right Chest)  Patient Location: PACU  Anesthesia Type:GA combined with regional for post-op pain  Level of Consciousness: drowsy  Airway & Oxygen Therapy: Patient Spontanous Breathing and Patient connected to face mask oxygen  Post-op Assessment: Report given to RN and Post -op Vital signs reviewed and stable  Post vital signs: Reviewed and stable  Last Vitals:  Vitals Value Taken Time  BP 110/85 06/24/20 1310  Temp    Pulse 93 06/24/20 1313  Resp 19 06/24/20 1313  SpO2 100 % 06/24/20 1313  Vitals shown include unvalidated device data.  Last Pain:  Vitals:   06/24/20 0915  PainSc: 0-No pain         Complications: No complications documented.

## 2020-06-24 NOTE — Anesthesia Procedure Notes (Signed)
Anesthesia Regional Block: Pectoralis block   Pre-Anesthetic Checklist: ,, timeout performed, Correct Patient, Correct Site, Correct Laterality, Correct Procedure, Correct Position, site marked, Risks and benefits discussed,  Surgical consent,  Pre-op evaluation,  At surgeon's request and post-op pain management  Laterality: Left  Prep: Maximum Sterile Barrier Precautions used, chloraprep       Needles:  Injection technique: Single-shot  Needle Type: Echogenic Stimulator Needle     Needle Length: 9cm  Needle Gauge: 22     Additional Needles:   Procedures:,,,, ultrasound used (permanent image in chart),,,,  Narrative:  Start time: 06/24/2020 10:10 AM End time: 06/24/2020 10:15 AM Injection made incrementally with aspirations every 5 mL.  Performed by: Personally  Anesthesiologist: Pervis Hocking, DO  Additional Notes: Monitors applied. No increased pain on injection. No increased resistance to injection. Injection made in 5cc increments. Good needle visualization. Patient tolerated procedure well.

## 2020-06-24 NOTE — Op Note (Addendum)
Preoperative diagnosisTN clinical stage I left breast cancer Postoperative diagnosis: Same as above Procedure: rightinternal jugular port placement with ultrasound guidance left breast seed guided lumpectomy Left deep axillary sentinel node biopsy Surgeon: Dr. Serita Grammes Anesthesia: General  Estimated blood loss:minimal Specimens: 1. Left breast tissue containing seed and clip marked with paint 2. Additional posterior and inferior margins marked short superior, long lateral, double deep 3. Left deep ax sn, highest count 379 Sponge and needlecount was correct atcompletion Drains: None Disposition recovery stable condition  Indications: 77 yof with prior benign breast biopsy, no fh breast cancer who presents after screening mm. she has c density breasts. she was noted to have a 1 cm left breast mass. on [place] there is a 7 mm mass. nodes are mostly normal- there is one with borderline cortical thickness. 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 we discussed BCT and port placement  Procedure: After informed consent was obtainedshe was taken to the OR.She was given antibiotics. SCDs were placed. She underwent a pectoral block.  She was injected with Lymphoseek in the standard periareolar fashion.  She had had a radioactive seed placed prior to beginning I had these mammograms available for review.  She was placed under general anesthesia without complication. She was prepped and draped in the standard sterile surgical fashion. A surgical timeout was then performed.  I used the ultrasound to identify therightinternal jugular vein. Under ultrasound guidance I then accessed the vein with the needle. I passed the wire. The wire was in the vein both by ultrasound and by fluoroscopy. I thenmade an incisionon herright chest.I tunneled the line between the 2 sites. I then placed the dilator over the wire. I observed  this with fluoroscopy to go in the correct position. I then removedthe wire. I then passed the line. The peel-away sheath was removed. I pulled the line back to be in the superior vena cava.The tip of the line is in the superior vena cava.I then attached the port. I sutured this into place with 2-0 Prolene. I then closed this with 3-0 Vicryl and 4-0 Monocryl. Glue was placed. Final fluoroscopic image showed the port to be in good position. I then accessed the port and was able to aspirate blood and packed this with heparin.  I then did the lumpectomy.  This was in the very medial aspect of the breast and there was not a lot of tissue on either side of the seed.  Due to this I made an incision overlying the tumor.  I actually removed a small crescent of skin overlying the tumor to make sure my anterior margin was close.  I then remove the seed and the surrounding tissue with an attempt to get a clear margin.  I did a 3D image upon completion.  It look like I might be close inferiorly.  I took some additional inferior margin.  I also took additional posterior margin so the posterior margin is now the muscle.  I placed a clip in the cavity.  Hemostasis was obtained.  I pulled the breast tissue together with a 2-0 Vicryl.  I closed the skin with a 3-0 Vicryl and a 4-0 Monocryl.  Glue and Steri-Strips were eventually applied.  I then identified the location of the sentinel node in the low axilla.  I made a curvilinear incision.  I dissected through the axillary fascia.  I used the neoprobe to guide removal of what  looked to be 2 or 3 small sentinel lymph nodes.  The highest count was 379.  There was really no background radioactivity.  Hemostasis was observed.  I closed this with 2-0 Vicryl, 3-0 Vicryl, and 4-0 Monocryl.  Glue and Steri-Strips were applied.  She tolerated this well was extubated and transferred to recovery stable.

## 2020-06-24 NOTE — Progress Notes (Signed)
Assisted Dr. Finucane with left, ultrasound guided, pectoralis block. Side rails up, monitors on throughout procedure. See vital signs in flow sheet. Tolerated Procedure well. 

## 2020-06-24 NOTE — Anesthesia Procedure Notes (Signed)
Procedure Name: Intubation Date/Time: 06/24/2020 11:37 AM Performed by: Glory Buff, CRNA Pre-anesthesia Checklist: Patient identified, Emergency Drugs available, Suction available and Patient being monitored Patient Re-evaluated:Patient Re-evaluated prior to induction Oxygen Delivery Method: Circle system utilized Preoxygenation: Pre-oxygenation with 100% oxygen Induction Type: IV induction Ventilation: Mask ventilation without difficulty Laryngoscope Size: Miller and 3 Grade View: Grade I Tube type: Oral Tube size: 7.0 mm Number of attempts: 1 Airway Equipment and Method: Stylet and Oral airway Placement Confirmation: ETT inserted through vocal cords under direct vision,  positive ETCO2 and breath sounds checked- equal and bilateral Secured at: 21 cm Tube secured with: Tape Dental Injury: Teeth and Oropharynx as per pre-operative assessment

## 2020-06-25 ENCOUNTER — Ambulatory Visit: Payer: 59 | Admitting: Physical Therapy

## 2020-06-25 ENCOUNTER — Encounter (HOSPITAL_BASED_OUTPATIENT_CLINIC_OR_DEPARTMENT_OTHER): Payer: Self-pay | Admitting: General Surgery

## 2020-06-26 LAB — SURGICAL PATHOLOGY

## 2020-07-04 ENCOUNTER — Encounter: Payer: Self-pay | Admitting: Oncology

## 2020-07-04 ENCOUNTER — Inpatient Hospital Stay (HOSPITAL_BASED_OUTPATIENT_CLINIC_OR_DEPARTMENT_OTHER): Payer: 59 | Admitting: Oncology

## 2020-07-04 ENCOUNTER — Other Ambulatory Visit: Payer: Self-pay

## 2020-07-04 VITALS — BP 128/82 | HR 87 | Temp 98.9°F | Wt 187.8 lb

## 2020-07-04 DIAGNOSIS — C50512 Malignant neoplasm of lower-outer quadrant of left female breast: Secondary | ICD-10-CM | POA: Diagnosis not present

## 2020-07-04 DIAGNOSIS — Z171 Estrogen receptor negative status [ER-]: Secondary | ICD-10-CM

## 2020-07-04 DIAGNOSIS — Z7189 Other specified counseling: Secondary | ICD-10-CM

## 2020-07-04 DIAGNOSIS — C50919 Malignant neoplasm of unspecified site of unspecified female breast: Secondary | ICD-10-CM

## 2020-07-05 ENCOUNTER — Other Ambulatory Visit: Payer: Self-pay | Admitting: *Deleted

## 2020-07-05 DIAGNOSIS — C50919 Malignant neoplasm of unspecified site of unspecified female breast: Secondary | ICD-10-CM

## 2020-07-07 MED ORDER — DEXAMETHASONE 4 MG PO TABS: 8.0000 mg | ORAL_TABLET | Freq: Every day | ORAL | 1 refills | Status: DC

## 2020-07-07 MED ORDER — PROCHLORPERAZINE MALEATE 10 MG PO TABS: 10.0000 mg | ORAL_TABLET | Freq: Four times a day (QID) | ORAL | 1 refills | Status: DC | PRN

## 2020-07-07 MED ORDER — ONDANSETRON HCL 8 MG PO TABS: 8.0000 mg | ORAL_TABLET | Freq: Two times a day (BID) | ORAL | 1 refills | Status: DC | PRN

## 2020-07-07 MED ORDER — LORAZEPAM 0.5 MG PO TABS: 0.5000 mg | ORAL_TABLET | Freq: Four times a day (QID) | ORAL | 0 refills | Status: DC | PRN

## 2020-07-07 MED ORDER — LIDOCAINE-PRILOCAINE 2.5-2.5 % EX CREA: TOPICAL_CREAM | CUTANEOUS | 3 refills | Status: DC

## 2020-07-07 NOTE — Progress Notes (Signed)
START ON PATHWAY REGIMEN - Breast     Cycles 1 through 4: A cycle is every 14 days:     Doxorubicin      Cyclophosphamide      Pegfilgrastim-[id number]    Cycles 5 through 16: A cycle is every 7 days:     Paclitaxel   **Always confirm dose/schedule in your pharmacy ordering system**  Patient Characteristics: Postoperative without Neoadjuvant Therapy (Pathologic Staging), Invasive Disease, Adjuvant Therapy, HER2 Negative/Unknown/Equivocal, ER Negative/Unknown, Node Negative, pT1a-c, N36m or pT1c or Higher, pN0 Therapeutic Status: Postoperative without Neoadjuvant Therapy (Pathologic Staging) AJCC Grade: G2 AJCC N Category: pN0 AJCC M Category: cM0 ER Status: Negative (-) AJCC 8 Stage Grouping: IB HER2 Status: Negative (-) Oncotype Dx Recurrence Score: Not Appropriate AJCC T Category: pT1c PR Status: Negative (-) Adjuvant Therapy Status: No Adjuvant Therapy Received Yet or Changing Initial Adjuvant Regimen due to Tolerance Intent of Therapy: Curative Intent, Discussed with Patient

## 2020-07-07 NOTE — Progress Notes (Signed)
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  This was followed by diagnostic mammogram and ultrasound which showed a 7 x 7 x 5 mm hypoechoic mass in the 7 o'clock position of the left breast 8 cm from the nipple.  Ultrasound of the left axilla demonstrates 1 normal-appearing left axillary lymph nodes including 1 with borderline diffuse cortical thickening measuring 3.8 mm in maximum thickness.  No  Lymph nodes with focal or eccentric cortical thickening were seen.  The suspicious breast mass was biopsied and was consistent with triple negative grade 3 invasive mammary carcinoma.  1 cm invasive mammary carcinoma that was grade 2 with negative margins.  1 sentinel lymph node was negative for malignancy.  Patient underwent lumpectomy with sentinel lymph node biopsy.  Final pathology showed one-point  Interval history-patient is recovering well from her lumpectomy and denies any specific complaints at this time.  ECOG PS- 0 Pain scale- 0   Review of systems- Review of Systems  Constitutional: Negative for chills, fever, malaise/fatigue and weight loss.  HENT: Negative for congestion, ear discharge and nosebleeds.   Eyes: Negative for blurred vision.  Respiratory: Negative for cough, hemoptysis, sputum production, shortness of breath and  wheezing.   Cardiovascular: Negative for chest pain, palpitations, orthopnea and claudication.  Gastrointestinal: Negative for abdominal pain, blood in stool, constipation, diarrhea, heartburn, melena, nausea and vomiting.  Genitourinary: Negative for dysuria, flank pain, frequency, hematuria and urgency.  Musculoskeletal: Negative for back pain, joint pain and myalgias.  Skin: Negative for rash.  Neurological: Negative for dizziness, tingling, focal weakness, seizures, weakness and headaches.  Endo/Heme/Allergies: Does not bruise/bleed easily.  Psychiatric/Behavioral: Negative for depression and suicidal ideas. The patient does not have insomnia.       Allergies  Allergen Reactions  . Keppra [Levetiracetam]   . Tegretol [Carbamazepine]      Past Medical History:  Diagnosis Date  . Allergy   . AR (allergic rhinitis)   . Arthritis   . Family history of brain cancer   . Family history of lung cancer   . Hair loss   . Hypertension    x 5years  . Migraine   . Psoriasis   . Seizure ([place])    x 17years  . Seizure disorder ([place])   . Vitamin D deficiency      Past Surgical History:  Procedure Laterality Date  . BREAST BIOPSY Right    benign ? date-before 2006  . BREAST BIOPSY Left 05/30/2020   [place] bx, coil marker, path pending  . BREAST LUMPECTOMY WITH RADIOACTIVE SEED AND SENTINEL LYMPH NODE BIOPSY Left 06/24/2020   Procedure: LEFT BREAST LUMPECTOMY WITH RADIOACTIVE SEED AND LEFT AXILLARY SENTINEL LYMPH NODE BIOPSY;  Surgeon: Rolm Bookbinder, MD;  Location: [place];  Service: General;  Laterality: Left;  . CESAREAN SECTION    . CHOLECYSTECTOMY    . KNEE SURGERY    . PORTACATH PLACEMENT Right  06/24/2020   Procedure: INSERTION PORT-A-CATH;  Surgeon: Rolm Bookbinder, MD;  Location: [place];  Service: General;  Laterality: Right;  . TONSILLECTOMY    . TUBAL LIGATION      Social History   Socioeconomic History  . Marital status:  Married    Spouse name: Event organiser  . Number of children: 3  . Years of education: College  . Highest education level: Not on file  Occupational History  . Occupation:      Employer: OTHER    Comment: Publishing copy  Tobacco Use  . Smoking status: Never Smoker  . Smokeless tobacco: Never Used  Vaping Use  . Vaping Use: Never used  Substance and Sexual Activity  . Alcohol use: Yes    Comment: Rarely- wine  . Drug use: No  . Sexual activity: Yes    Birth control/protection: Post-menopausal  Other Topics Concern  . Not on file  Social History Narrative   Patient lives at home with her family.   Caffeine Use: 1 cups of coffee daily   Social Determinants of Health   Financial Resource Strain: Not on file  Food Insecurity: Not on file  Transportation Needs: Not on file  Physical Activity: Not on file  Stress: Not on file  Social Connections: Not on file  Intimate Partner Violence: Not on file    Family History  Problem Relation Age of Onset  . Brain cancer Mother 62       tumor  . Diabetes Father   . Heart disease Father   . Lung cancer Father   . Cancer Paternal Aunt   . Diabetes Maternal Grandmother   . Stroke Maternal Grandfather   . Breast cancer Neg Hx      Current Outpatient Medications:  .  cetirizine (ZYRTEC) 10 MG tablet, Take 10 mg by mouth daily., Disp: , Rfl:  .  fluticasone (FLONASE) 50 MCG/ACT nasal spray, Place 2 sprays into both nostrils daily., Disp: , Rfl:  .  LAMICTAL XR 200 MG TB24, Take 1 tablet (200 mg total) by mouth 2 (two) times daily. Brand Medically Necessary, Disp: 180 tablet, Rfl: 3 .  lisinopril-hydrochlorothiazide (ZESTORETIC) 10-12.5 MG tablet, Take 1 tablet by mouth daily. Pt states 1/2 tablet a day, Disp: , Rfl:  .  Multiple Vitamin (MULTI-VITAMINS) TABS, Take 1 tablet by mouth daily. , Disp: , Rfl:  .  oxyCODONE (OXY IR/ROXICODONE) 5 MG immediate release tablet, Take 1 tablet (5 mg total) by mouth every 6 (six) hours as  needed., Disp: 12 tablet, Rfl: 0 .  pantoprazole (PROTONIX) 20 MG tablet, Take 20 mg by mouth daily., Disp: , Rfl:   Physical exam:  Vitals:   07/04/20 1435  BP: 128/82  Pulse: 87  Temp: 98.9 F (37.2 C)  TempSrc: Tympanic  SpO2: 98%  Weight: 187 lb 12.8 oz (85.2 kg)   Physical Exam Constitutional:      General: She is not in acute distress. Cardiovascular:     Rate and Rhythm: Normal rate.  Pulmonary:     Effort: Pulmonary effort is normal.  Skin:    General: Skin is warm and dry.  Neurological:     Mental Status: She is alert and oriented to person, place, and time.    Breast: Patient is s/p left lumpectomy with sentinel lymph node biopsy.  Area appears to be healing well.  Steri-Strips still in place.  CMP Latest Ref Rng & Units 06/20/2020  Glucose 70 - 99 mg/dL 90  BUN 8 - 23 mg/dL 13  Creatinine 0.44 - 1.00 mg/dL 0.86  Sodium 135 - 145 mmol/L 139  Potassium 3.5 - 5.1 mmol/L 5.0  Chloride 98 - 111 mmol/L 102  CO2 22 - 32 mmol/L 29  Calcium 8.9 - 10.3 mg/dL 9.5  Total Protein 6.5 - 8.1 g/dL -  Total Bilirubin 0.3 - 1.2 mg/dL -  Alkaline Phos 38 - 126 U/L -  AST 15 - 41 U/L -  ALT 0 - 44 U/L -   CBC Latest Ref Rng & Units 12/17/2017  WBC 3.6 - 11.0 K/uL 5.0  Hemoglobin 12.0 - 16.0 g/dL 13.4  Hematocrit 35.0 - 47.0 % 38.6  Platelets 150 - 440 K/uL 228    No images are attached to the encounter.  DG Chest 1 View  Result Date: 06/24/2020 CLINICAL DATA:  Surgery, Port-A-Cath insertion. EXAM: DG C-ARM 1-60 MIN; CHEST  1 VIEW FLUOROSCOPY TIME:  Fluoroscopy Time:  19 seconds Radiation Exposure Index (if provided by the fluoroscopic device): 1.62 mGy Number of Acquired Spot Images: 1 COMPARISON:  None. FINDINGS: Single fluoroscopic spot view obtained in the operating room demonstrates a right-sided chest port in place. The tip is in the region of the mid SVC. IMPRESSION: Procedural fluoroscopy during right chest port insertion. Electronically Signed   By: Keith Rake M.D.   On: 06/24/2020 18:42   NM Sentinel Node Inj-No Rpt (Breast)  Result Date: 06/24/2020 Sulfur colloid was injected by the nuclear medicine technologist for melanoma sentinel node.   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 EXAM: SPECIMEN RADIOGRAPH OF THE LEFT BREAST COMPARISON:  Previous exam(s). FINDINGS: Status post excision of the LEFT breast. The radioactive seed and COIL biopsy marker clip are present and completely intact. IMPRESSION: Specimen radiograph of the LEFT breast. Electronically Signed   By: Margarette Canada M.D.   On: 06/24/2020 12:37   DG C-Arm 1-60 Min  Result Date: 06/24/2020 CLINICAL DATA:  Surgery, Port-A-Cath insertion. EXAM: DG C-ARM 1-60 MIN; CHEST  1 VIEW FLUOROSCOPY TIME:  Fluoroscopy Time:  19 seconds Radiation Exposure Index (if provided by the fluoroscopic device): 1.62 mGy Number of Acquired Spot Images: 1 COMPARISON:  None. FINDINGS: Single fluoroscopic spot view obtained in the operating room demonstrates a right-sided chest port in place. The tip is in the region of the mid SVC. IMPRESSION: Procedural fluoroscopy during right chest port insertion. Electronically Signed   By: Keith Rake M.D.   On: 06/24/2020 18:42   MM LT RADIOACTIVE SEED LOC MAMMO GUIDE  Result Date: 06/21/2020 CLINICAL DATA:  Recently diagnosed LEFT breast cancer scheduled for lumpectomy requiring preoperative radioactive seed localization. EXAM: MAMMOGRAPHIC GUIDED RADIOACTIVE SEED LOCALIZATION OF THE LEFT BREAST COMPARISON:  Previous exam(s). FINDINGS: Patient presents for radioactive seed localization prior to lumpectomy. I met with the patient and we discussed the procedure of seed localization including benefits and alternatives. We discussed the high likelihood of a successful procedure. We discussed the risks of the procedure including infection, bleeding, tissue injury and further  surgery. We discussed the low dose of radioactivity involved in the procedure. Informed, written consent was given. The usual time-out protocol was performed immediately prior to the procedure. Using mammographic guidance, sterile technique, 1% lidocaine and an I-125 radioactive seed, the mass with coil shaped clip in the lower inner quadrant of the LEFT breast was localized using a medial approach. The follow-up mammogram images confirm the seed in the  expected location and were marked for Dr. Donne Hazel. Follow-up survey of the patient confirms presence of the radioactive seed. Order number of I-125 seed:  [id number]. Total activity:  8.003 millicuries reference Date: 06/06/2020 The patient tolerated the procedure well and was released from the [facility]. She was given instructions regarding seed removal. IMPRESSION: Radioactive seed localization left breast. No apparent complications. Electronically Signed   By: Franki Cabot M.D.   On: 06/21/2020 15:44     Assessment and plan- Patient is a 65 y.o. female with pathological prognostic stage Ib invasive mammary carcinoma triple negative pT1 cpN0 cM0 s/p lumpectomy and sentinel lymph node biopsy here to discuss final pathology results and further management  Discussed the results of final pathology with the patient which showed a 1.1 cm triple negative invasive mammary carcinoma grade 2 with negative margins.  1 sentinel lymph node was negative for malignancy.  Given that the size of primary tumor in the final pathology was 1.1 cm and the biopsy specimen tumor was 1 cm, I would favor anthracycline-based adjuvant chemotherapy despite the negative sentinel lymph node biopsy.  I recommend 4 cycles of dose dense AC chemotherapy given every 2 weeks followed by 12 weekly cycles of Taxol chemotherapy.    Discussed risks and benefits of chemotherapy including all but not limited to nausea, vomiting, low blood counts, risk of infections and hospitalization.  Risk  of cardiotoxicity and possible leukemia associated with doxorubicin.  Risk of infusion reaction and peripheral neuropathy associated with Taxol.  Patient understands and agrees to proceed as planned.  Treatment will be given with a curative intent.  Patient already has a port in place.  Will obtain a baseline echocardiogram as well as chemo teach.Plan to tentatively start chemotherapy in 10 days time.  Patient and her husband had multiple insightful questions which were answered to their satisfaction.  Cancer Staging Malignant neoplasm of lower-outer quadrant of left breast of female, estrogen receptor negative ([place]) Staging form: Breast, AJCC 8th Edition - Clinical stage from 06/19/2020: Stage IB (cT1b, cN0, cM0, G3, ER-, PR-, HER2-) - Signed by Sindy Guadeloupe, MD on 06/19/2020 Stage prefix: Initial diagnosis Histologic grading system: 3 grade system - Pathologic stage from 07/04/2020: Stage IB (pT1c, pN0, cM0, G2, ER-, PR-, HER2-) - Signed by Sindy Guadeloupe, MD on 07/07/2020 Histologic grading system: 3 grade system     Visit Diagnosis 1. Malignant neoplasm of lower-outer quadrant of left breast of female, estrogen receptor negative ([place])   2. Goals of care, counseling/discussion      Dr. [name], MD, MPH [facility] [phone] 07/07/2020 3:49 PM

## 2020-07-08 ENCOUNTER — Other Ambulatory Visit: Payer: Self-pay | Admitting: Adult Health

## 2020-07-08 ENCOUNTER — Other Ambulatory Visit: Payer: 59

## 2020-07-08 ENCOUNTER — Other Ambulatory Visit: Payer: Self-pay

## 2020-07-08 ENCOUNTER — Encounter: Payer: Self-pay | Admitting: Physical Therapy

## 2020-07-08 ENCOUNTER — Ambulatory Visit: Payer: 59 | Attending: General Surgery | Admitting: Physical Therapy

## 2020-07-08 ENCOUNTER — Encounter: Payer: Self-pay | Admitting: Adult Health

## 2020-07-08 DIAGNOSIS — Z171 Estrogen receptor negative status [ER-]: Secondary | ICD-10-CM | POA: Diagnosis present

## 2020-07-08 DIAGNOSIS — C50919 Malignant neoplasm of unspecified site of unspecified female breast: Secondary | ICD-10-CM

## 2020-07-08 DIAGNOSIS — R293 Abnormal posture: Secondary | ICD-10-CM | POA: Insufficient documentation

## 2020-07-08 DIAGNOSIS — C50512 Malignant neoplasm of lower-outer quadrant of left female breast: Secondary | ICD-10-CM | POA: Diagnosis present

## 2020-07-08 NOTE — Therapy (Signed)
[place], [place], [address] Phone: [phone]   Fax:  [phone]  Physical Therapy Treatment  Patient Details  Name: Anna Stewart MRN: [id number] Date of Birth: Dec 11, 1955 Referring Provider (PT): Ave Filter Date: 07/08/2020   PT End of Session - 07/08/20 1530    Visit Number 2    Number of Visits 2    Date for PT Re-Evaluation 07/30/20    PT Start Time 1502    PT Stop Time 1525    PT Time Calculation (min) 23 min    Activity Tolerance Patient tolerated treatment well    Behavior During Therapy [facility] for tasks assessed/performed           Past Medical History:  Diagnosis Date  . Allergy   . AR (allergic rhinitis)   . Arthritis   . Family history of brain cancer   . Family history of lung cancer   . Hair loss   . Hypertension    x 5years  . Migraine   . Psoriasis   . Seizure ([place])    x 17years  . Seizure disorder ([place])   . Vitamin D deficiency     Past Surgical History:  Procedure Laterality Date  . BREAST BIOPSY Right    benign ? date-before 2006  . BREAST BIOPSY Left 05/30/2020   [place] bx, coil marker, path pending  . BREAST LUMPECTOMY WITH RADIOACTIVE SEED AND SENTINEL LYMPH NODE BIOPSY Left 06/24/2020   Procedure: LEFT BREAST LUMPECTOMY WITH RADIOACTIVE SEED AND LEFT AXILLARY SENTINEL LYMPH NODE BIOPSY;  Surgeon: Rolm Bookbinder, MD;  Location: [place];  Service: General;  Laterality: Left;  . CESAREAN SECTION    . CHOLECYSTECTOMY    . KNEE SURGERY    . PORTACATH PLACEMENT Right 06/24/2020   Procedure: INSERTION PORT-A-CATH;  Surgeon: Rolm Bookbinder, MD;  Location: [place];  Service: General;  Laterality: Right;  . TONSILLECTOMY    . TUBAL LIGATION      There were no vitals filed for this visit.   Subjective Assessment - 07/08/20 1502    Subjective I am doing great. No issues with ROM. There were only 2 days where  I had to take prescription drugs for pain. I start chemo on Monday.    Pertinent History L breast cancer triple negative, 06/24/20 L lumpectomy and SLNB, then chemo, then radiation,    Patient Stated Goals to gain info from provider    Currently in Pain? No/denies    Pain Score 0-No pain              [facility] PT Assessment - 07/08/20 0001      Observation/Other Assessments   Observations healing scars present in breast and axilla - completely closed and steri strips have come off      AROM   Left Shoulder Extension 62 Degrees    Left Shoulder Flexion 165 Degrees    Left Shoulder ABduction 176 Degrees    Left Shoulder Internal Rotation 61 Degrees    Left Shoulder External Rotation 90 Degrees             LYMPHEDEMA/ONCOLOGY QUESTIONNAIRE - 07/08/20 0001      Left Upper Extremity Lymphedema   15 cm Proximal to Olecranon Process 31.5 cm    Olecranon Process 27 cm    15 cm Proximal to Ulnar Styloid Process 26 cm    Just Proximal to Ulnar Styloid Process 15.1 cm  Across Hand at [employer] 18.2 cm    At [place] of 2nd Digit 6.4 cm                              PT Education - 07/08/20 1528    Education Details walking program, scar massage, ABC class    Person(s) Educated Patient    Methods Explanation    Comprehension Verbalized understanding               PT Long Term Goals - 07/08/20 1537      PT LONG TERM GOAL #1   Title Pt will return to baseline ROM measurements and not demonstrate any signs or symptoms of lymphedema.    Time 6    Period Weeks    Status Achieved                 Plan - 07/08/20 1531    Clinical Impression Statement Pt returns to post op follow up after undergoing a L lumpectomy and SLNB on 06/24/20. She will begin chemo on Monday followed by radiation. Remeasured pt's shoulder ROM and it has returned to baseline. She does not demonstrate any signs or symptoms of lymphedema. Educated pt in scar massage in another  1-2 weeks. Pt is scheduled to take the ABC class tomorrow. She is scheduled for [facility] in July. Educated pt on importance of exercise throughout cancer treatments to help decrease fatigue and help pt tolerate treatments better. Pt has no additional skilled PT needs at this time and will be discharged.    PT Frequency --   eval and 1 f/u visit   PT Duration 6 weeks    PT Treatment/Interventions ADLs/Self Care Home Management;Patient/family education;Therapeutic exercise    PT Next Visit Plan d/c at this time    PT Home Exercise Plan post op breast exercises    Consulted and Agree with Plan of Care Patient           Patient will benefit from skilled therapeutic intervention in order to improve the following deficits and impairments:  Decreased knowledge of precautions,Postural dysfunction  Visit Diagnosis: Abnormal posture  Malignant neoplasm of lower-outer quadrant of left breast of female, estrogen receptor negative ([place])     Problem List Patient Active Problem List   Diagnosis Date Noted  . Family history of brain cancer   . Family history of lung cancer   . Malignant neoplasm of lower-outer quadrant of left breast of female, estrogen receptor negative ([place]) 06/19/2020  . Vaginal atrophy 05/14/2015  . Menopause 05/14/2015  . Headache, migraine 05/14/2015  . Benign essential HTN 10/12/2014  . B12 deficiency 08/30/2013  . Clinical depression 08/30/2013  . Seizure ([place]) 07/10/2013  . Dupuytren's contracture of foot 07/10/2013  . 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  Patient goals were met. Patient is being discharged due to meeting the stated rehab goals.  ?????  Allyson Sabal [place], [place] 07/08/20 3:39 PM

## 2020-07-08 NOTE — Patient Instructions (Signed)
Doxorubicin injection What is this medicine? DOXORUBICIN (dox oh ROO bi sin) is a chemotherapy drug. It is used to treat many kinds of cancer like leukemia, lymphoma, neuroblastoma, sarcoma, and Wilms' tumor. It is also used to treat bladder cancer, breast cancer, lung cancer, ovarian cancer, stomach cancer, and thyroid cancer. This medicine may be used for other purposes; ask your health care provider or pharmacist if you have questions. COMMON BRAND NAME(S): Adriamycin, Adriamycin PFS, Adriamycin RDF, Rubex What should I tell my health care provider before I take this medicine? They need to know if you have any of these conditions:  heart disease  history of low blood counts caused by a medicine  liver disease  recent or ongoing radiation therapy  an unusual or allergic reaction to doxorubicin, other chemotherapy agents, other medicines, foods, dyes, or preservatives  pregnant or trying to get pregnant  breast-feeding How should I use this medicine? This drug is given as an infusion into a vein. It is administered in a hospital or clinic by a specially trained health care professional. If you have pain, swelling, burning or any unusual feeling around the site of your injection, tell your health care professional right away. Talk to your pediatrician regarding the use of this medicine in children. Special care may be needed. Overdosage: If you think you have taken too much of this medicine contact a poison control center or emergency room at once. NOTE: This medicine is only for you. Do not share this medicine with others. What if I miss a dose? It is important not to miss your dose. Call your doctor or health care professional if you are unable to keep an appointment. What may interact with this medicine? This medicine may interact with the following medications:  6-mercaptopurine  paclitaxel  phenytoin  St. John's Wort  trastuzumab  verapamil This list may not describe  all possible interactions. Give your health care provider a list of all the medicines, herbs, non-prescription drugs, or dietary supplements you use. Also tell them if you smoke, drink alcohol, or use illegal drugs. Some items may interact with your medicine. What should I watch for while using this medicine? This drug may make you feel generally unwell. This is not uncommon, as chemotherapy can affect healthy cells as well as cancer cells. Report any side effects. Continue your course of treatment even though you feel ill unless your doctor tells you to stop. There is a maximum amount of this medicine you should receive throughout your life. The amount depends on the medical condition being treated and your overall health. Your doctor will watch how much of this medicine you receive in your lifetime. Tell your doctor if you have taken this medicine before. You may need blood work done while you are taking this medicine. Your urine may turn red for a few days after your dose. This is not blood. If your urine is dark or brown, call your doctor. In some cases, you may be given additional medicines to help with side effects. Follow all directions for their use. Call your doctor or health care professional for advice if you get a fever, chills or sore throat, or other symptoms of a cold or flu. Do not treat yourself. This drug decreases your body's ability to fight infections. Try to avoid being around people who are sick. This medicine may increase your risk to bruise or bleed. Call your doctor or health care professional if you notice any unusual bleeding. Talk to your doctor   about your risk of cancer. You may be more at risk for certain types of cancers if you take this medicine. Do not become pregnant while taking this medicine or for 6 months after stopping it. Women should inform their doctor if they wish to become pregnant or think they might be pregnant. Men should not father a child while taking this  medicine and for 6 months after stopping it. There is a potential for serious side effects to an unborn child. Talk to your health care professional or pharmacist for more information. Do not breast-feed an infant while taking this medicine. This medicine has caused ovarian failure in some women and reduced sperm counts in some men This medicine may interfere with the ability to have a child. Talk with your doctor or health care professional if you are concerned about your fertility. This medicine may cause a decrease in Co-Enzyme Q-10. You should make sure that you get enough Co-Enzyme Q-10 while you are taking this medicine. Discuss the foods you eat and the vitamins you take with your health care professional. What side effects may I notice from receiving this medicine? Side effects that you should report to your doctor or health care professional as soon as possible:  allergic reactions like skin rash, itching or hives, swelling of the face, lips, or tongue  breathing problems  chest pain  fast or irregular heartbeat  low blood counts - this medicine may decrease the number of white blood cells, red blood cells and platelets. You may be at increased risk for infections and bleeding.  pain, redness, or irritation at site where injected  signs of infection - fever or chills, cough, sore throat, pain or difficulty passing urine  signs of decreased platelets or bleeding - bruising, pinpoint red spots on the skin, black, tarry stools, blood in the urine  swelling of the ankles, feet, hands  tiredness  weakness Side effects that usually do not require medical attention (report to your doctor or health care professional if they continue or are bothersome):  diarrhea  hair loss  mouth sores  nail discoloration or damage  nausea  red colored urine  vomiting This list may not describe all possible side effects. Call your doctor for medical advice about side effects. You may report  side effects to FDA at 1-800-FDA-1088. Where should I keep my medicine? This drug is given in a hospital or clinic and will not be stored at home. NOTE: This sheet is a summary. It may not cover all possible information. If you have questions about this medicine, talk to your doctor, pharmacist, or health care provider.  2021 Elsevier/Gold Standard (2016-11-04 11:01:26) Cyclophosphamide Injection What is this medicine? CYCLOPHOSPHAMIDE (sye kloe FOSS fa mide) is a chemotherapy drug. It slows the growth of cancer cells. This medicine is used to treat many types of cancer like lymphoma, myeloma, leukemia, breast cancer, and ovarian cancer, to name a few. This medicine may be used for other purposes; ask your health care provider or pharmacist if you have questions. COMMON BRAND NAME(S): Cytoxan, Neosar What should I tell my health care provider before I take this medicine? They need to know if you have any of these conditions:  heart disease  history of irregular heartbeat  infection  kidney disease  liver disease  low blood counts, like white cells, platelets, or red blood cells  on hemodialysis  recent or ongoing radiation therapy  scarring or thickening of the lungs  trouble passing urine  an  unusual or allergic reaction to cyclophosphamide, other medicines, foods, dyes, or preservatives  pregnant or trying to get pregnant  breast-feeding How should I use this medicine? This drug is usually given as an injection into a vein or muscle or by infusion into a vein. It is administered in a hospital or clinic by a specially trained health care professional. Talk to your pediatrician regarding the use of this medicine in children. Special care may be needed. Overdosage: If you think you have taken too much of this medicine contact a poison control center or emergency room at once. NOTE: This medicine is only for you. Do not share this medicine with others. What if I miss a  dose? It is important not to miss your dose. Call your doctor or health care professional if you are unable to keep an appointment. What may interact with this medicine?  amphotericin B  azathioprine  certain antivirals for HIV or hepatitis  certain medicines for blood pressure, heart disease, irregular heart beat  certain medicines that treat or prevent blood clots like warfarin  certain other medicines for cancer  cyclosporine  etanercept  indomethacin  medicines that relax muscles for surgery  medicines to increase blood counts  metronidazole This list may not describe all possible interactions. Give your health care provider a list of all the medicines, herbs, non-prescription drugs, or dietary supplements you use. Also tell them if you smoke, drink alcohol, or use illegal drugs. Some items may interact with your medicine. What should I watch for while using this medicine? Your condition will be monitored carefully while you are receiving this medicine. You may need blood work done while you are taking this medicine. Drink water or other fluids as directed. Urinate often, even at night. Some products may contain alcohol. Ask your health care professional if this medicine contains alcohol. Be sure to tell all health care professionals you are taking this medicine. Certain medicines, like metronidazole and disulfiram, can cause an unpleasant reaction when taken with alcohol. The reaction includes flushing, headache, nausea, vomiting, sweating, and increased thirst. The reaction can last from 30 minutes to several hours. Do not become pregnant while taking this medicine or for 1 year after stopping it. Women should inform their health care professional if they wish to become pregnant or think they might be pregnant. Men should not father a child while taking this medicine and for 4 months after stopping it. There is potential for serious side effects to an unborn child. Talk to your  health care professional for more information. Do not breast-feed an infant while taking this medicine or for 1 week after stopping it. This medicine has caused ovarian failure in some women. This medicine may make it more difficult to get pregnant. Talk to your health care professional if you are concerned about your fertility. This medicine has caused decreased sperm counts in some men. This may make it more difficult to father a child. Talk to your health care professional if you are concerned about your fertility. Call your health care professional for advice if you get a fever, chills, or sore throat, or other symptoms of a cold or flu. Do not treat yourself. This medicine decreases your body's ability to fight infections. Try to avoid being around people who are sick. Avoid taking medicines that contain aspirin, acetaminophen, ibuprofen, naproxen, or ketoprofen unless instructed by your health care professional. These medicines may hide a fever. Talk to your health care professional about your risk of cancer.   You may be more at risk for certain types of cancer if you take this medicine. If you are going to need surgery or other procedure, tell your health care professional that you are using this medicine. Be careful brushing or flossing your teeth or using a toothpick because you may get an infection or bleed more easily. If you have any dental work done, tell your dentist you are receiving this medicine. What side effects may I notice from receiving this medicine? Side effects that you should report to your doctor or health care professional as soon as possible:  allergic reactions like skin rash, itching or hives, swelling of the face, lips, or tongue  breathing problems  nausea, vomiting  signs and symptoms of bleeding such as bloody or black, tarry stools; red or dark brown urine; spitting up blood or brown material that looks like coffee grounds; red spots on the skin; unusual bruising  or bleeding from the eyes, gums, or nose  signs and symptoms of heart failure like fast, irregular heartbeat, sudden weight gain; swelling of the ankles, feet, hands  signs and symptoms of infection like fever; chills; cough; sore throat; pain or trouble passing urine  signs and symptoms of kidney injury like trouble passing urine or change in the amount of urine  signs and symptoms of liver injury like dark yellow or brown urine; general ill feeling or flu-like symptoms; light-colored stools; loss of appetite; nausea; right upper belly pain; unusually weak or tired; yellowing of the eyes or skin Side effects that usually do not require medical attention (report to your doctor or health care professional if they continue or are bothersome):  confusion  decreased hearing  diarrhea  facial flushing  hair loss  headache  loss of appetite  missed menstrual periods  signs and symptoms of low red blood cells or anemia such as unusually weak or tired; feeling faint or lightheaded; falls  skin discoloration This list may not describe all possible side effects. Call your doctor for medical advice about side effects. You may report side effects to FDA at 1-800-FDA-1088. Where should I keep my medicine? This drug is given in a hospital or clinic and will not be stored at home. NOTE: This sheet is a summary. It may not cover all possible information. If you have questions about this medicine, talk to your doctor, pharmacist, or health care provider.  2021 Elsevier/Gold Standard (2018-12-26 09:53:29) Pegfilgrastim injection What is this medicine? PEGFILGRASTIM (PEG fil gra stim) is a long-acting granulocyte colony-stimulating factor that stimulates the growth of neutrophils, a type of white blood cell important in the body's fight against infection. It is used to reduce the incidence of fever and infection in patients with certain types of cancer who are receiving chemotherapy that affects  the bone marrow, and to increase survival after being exposed to high doses of radiation. This medicine may be used for other purposes; ask your health care provider or pharmacist if you have questions. COMMON BRAND NAME(S): Rexene Edison, Ziextenzo What should I tell my health care provider before I take this medicine? They need to know if you have any of these conditions:  kidney disease  latex allergy  ongoing radiation therapy  sickle cell disease  skin reactions to acrylic adhesives (On-Body Injector only)  an unusual or allergic reaction to pegfilgrastim, filgrastim, other medicines, foods, dyes, or preservatives  pregnant or trying to get pregnant  breast-feeding How should I use this medicine? This medicine is for  injection under the skin. If you get this medicine at home, you will be taught how to prepare and give the pre-filled syringe or how to use the On-body Injector. Refer to the patient Instructions for Use for detailed instructions. Use exactly as directed. Tell your healthcare provider immediately if you suspect that the On-body Injector may not have performed as intended or if you suspect the use of the On-body Injector resulted in a missed or partial dose. It is important that you put your used needles and syringes in a special sharps container. Do not put them in a trash can. If you do not have a sharps container, call your pharmacist or healthcare provider to get one. Talk to your pediatrician regarding the use of this medicine in children. While this drug may be prescribed for selected conditions, precautions do apply. Overdosage: If you think you have taken too much of this medicine contact a poison control center or emergency room at once. NOTE: This medicine is only for you. Do not share this medicine with others. What if I miss a dose? It is important not to miss your dose. Call your doctor or health care professional if you miss your  dose. If you miss a dose due to an On-body Injector failure or leakage, a new dose should be administered as soon as possible using a single prefilled syringe for manual use. What may interact with this medicine? Interactions have not been studied. This list may not describe all possible interactions. Give your health care provider a list of all the medicines, herbs, non-prescription drugs, or dietary supplements you use. Also tell them if you smoke, drink alcohol, or use illegal drugs. Some items may interact with your medicine. What should I watch for while using this medicine? Your condition will be monitored carefully while you are receiving this medicine. You may need blood work done while you are taking this medicine. Talk to your health care provider about your risk of cancer. You may be more at risk for certain types of cancer if you take this medicine. If you are going to need a MRI, CT scan, or other procedure, tell your doctor that you are using this medicine (On-Body Injector only). What side effects may I notice from receiving this medicine? Side effects that you should report to your doctor or health care professional as soon as possible:  allergic reactions (skin rash, itching or hives, swelling of the face, lips, or tongue)  back pain  dizziness  fever  pain, redness, or irritation at site where injected  pinpoint red spots on the skin  red or dark-brown urine  shortness of breath or breathing problems  stomach or side pain, or pain at the shoulder  swelling  tiredness  trouble passing urine or change in the amount of urine  unusual bruising or bleeding Side effects that usually do not require medical attention (report to your doctor or health care professional if they continue or are bothersome):  bone pain  muscle pain This list may not describe all possible side effects. Call your doctor for medical advice about side effects. You may report side effects to  FDA at 1-800-FDA-1088. Where should I keep my medicine? Keep out of the reach of children. If you are using this medicine at home, you will be instructed on how to store it. Throw away any unused medicine after the expiration date on the label. NOTE: This sheet is a summary. It may not cover all possible information. If  you have questions about this medicine, talk to your doctor, pharmacist, or health care provider.  2021 Elsevier/Gold Standard (2019-04-14 13:20:51) Paclitaxel injection What is this medicine? PACLITAXEL (PAK li TAX el) is a chemotherapy drug. It targets fast dividing cells, like cancer cells, and causes these cells to die. This medicine is used to treat ovarian cancer, breast cancer, lung cancer, Kaposi's sarcoma, and other cancers. This medicine may be used for other purposes; ask your health care provider or pharmacist if you have questions. COMMON BRAND NAME(S): Onxol, Taxol What should I tell my health care provider before I take this medicine? They need to know if you have any of these conditions:  history of irregular heartbeat  liver disease  low blood counts, like low white cell, platelet, or red cell counts  lung or breathing disease, like asthma  tingling of the fingers or toes, or other nerve disorder  an unusual or allergic reaction to paclitaxel, alcohol, polyoxyethylated castor oil, other chemotherapy, other medicines, foods, dyes, or preservatives  pregnant or trying to get pregnant  breast-feeding How should I use this medicine? This drug is given as an infusion into a vein. It is administered in a hospital or clinic by a specially trained health care professional. Talk to your pediatrician regarding the use of this medicine in children. Special care may be needed. Overdosage: If you think you have taken too much of this medicine contact a poison control center or emergency room at once. NOTE: This medicine is only for you. Do not share this medicine  with others. What if I miss a dose? It is important not to miss your dose. Call your doctor or health care professional if you are unable to keep an appointment. What may interact with this medicine? Do not take this medicine with any of the following medications:  live virus vaccines This medicine may also interact with the following medications:  antiviral medicines for hepatitis, HIV or AIDS  certain antibiotics like erythromycin and clarithromycin  certain medicines for fungal infections like ketoconazole and itraconazole  certain medicines for seizures like carbamazepine, phenobarbital, phenytoin  gemfibrozil  nefazodone  rifampin  St. John's wort This list may not describe all possible interactions. Give your health care provider a list of all the medicines, herbs, non-prescription drugs, or dietary supplements you use. Also tell them if you smoke, drink alcohol, or use illegal drugs. Some items may interact with your medicine. What should I watch for while using this medicine? Your condition will be monitored carefully while you are receiving this medicine. You will need important blood work done while you are taking this medicine. This medicine can cause serious allergic reactions. To reduce your risk you will need to take other medicine(s) before treatment with this medicine. If you experience allergic reactions like skin rash, itching or hives, swelling of the face, lips, or tongue, tell your doctor or health care professional right away. In some cases, you may be given additional medicines to help with side effects. Follow all directions for their use. This drug may make you feel generally unwell. This is not uncommon, as chemotherapy can affect healthy cells as well as cancer cells. Report any side effects. Continue your course of treatment even though you feel ill unless your doctor tells you to stop. Call your doctor or health care professional for advice if you get a  fever, chills or sore throat, or other symptoms of a cold or flu. Do not treat yourself. This drug decreases your  body's ability to fight infections. Try to avoid being around people who are sick. This medicine may increase your risk to bruise or bleed. Call your doctor or health care professional if you notice any unusual bleeding. Be careful brushing and flossing your teeth or using a toothpick because you may get an infection or bleed more easily. If you have any dental work done, tell your dentist you are receiving this medicine. Avoid taking products that contain aspirin, acetaminophen, ibuprofen, naproxen, or ketoprofen unless instructed by your doctor. These medicines may hide a fever. Do not become pregnant while taking this medicine. Women should inform their doctor if they wish to become pregnant or think they might be pregnant. There is a potential for serious side effects to an unborn child. Talk to your health care professional or pharmacist for more information. Do not breast-feed an infant while taking this medicine. Men are advised not to father a child while receiving this medicine. This product may contain alcohol. Ask your pharmacist or healthcare provider if this medicine contains alcohol. Be sure to tell all healthcare providers you are taking this medicine. Certain medicines, like metronidazole and disulfiram, can cause an unpleasant reaction when taken with alcohol. The reaction includes flushing, headache, nausea, vomiting, sweating, and increased thirst. The reaction can last from 30 minutes to several hours. What side effects may I notice from receiving this medicine? Side effects that you should report to your doctor or health care professional as soon as possible:  allergic reactions like skin rash, itching or hives, swelling of the face, lips, or tongue  breathing problems  changes in vision  fast, irregular heartbeat  high or low blood pressure  mouth sores  pain,  tingling, numbness in the hands or feet  signs of decreased platelets or bleeding - bruising, pinpoint red spots on the skin, black, tarry stools, blood in the urine  signs of decreased red blood cells - unusually weak or tired, feeling faint or lightheaded, falls  signs of infection - fever or chills, cough, sore throat, pain or difficulty passing urine  signs and symptoms of liver injury like dark yellow or brown urine; general ill feeling or flu-like symptoms; light-colored stools; loss of appetite; nausea; right upper belly pain; unusually weak or tired; yellowing of the eyes or skin  swelling of the ankles, feet, hands  unusually slow heartbeat Side effects that usually do not require medical attention (report to your doctor or health care professional if they continue or are bothersome):  diarrhea  hair loss  loss of appetite  muscle or joint pain  nausea, vomiting  pain, redness, or irritation at site where injected  tiredness This list may not describe all possible side effects. Call your doctor for medical advice about side effects. You may report side effects to FDA at 1-800-FDA-1088. Where should I keep my medicine? This drug is given in a hospital or clinic and will not be stored at home. NOTE: This sheet is a summary. It may not cover all possible information. If you have questions about this medicine, talk to your doctor, pharmacist, or health care provider.  2021 Elsevier/Gold Standard (2019-02-22 13:37:23)

## 2020-07-08 NOTE — Progress Notes (Signed)
I connected by phone with Anna Stewart on 07/08/2020, 12:13 PM to discuss the potential use of a new treatment, tixagevimab/cilgavimab, for pre-exposure prophylaxis for prevention of coronavirus disease 2019 (COVID-19) caused by the SARS-CoV-2 virus.  This patient is a 66 y.o. female that meets the FDA criteria for Emergency Use Authorization of tixagevimab/cilgavimab for pre-exposure prophylaxis of COVID-19 disease. Pt meets following criteria:  Age >12 yr and weight > 40kg  Not currently infected with SARS-CoV-2 and has no known recent exposure to an individual infected with SARS-CoV-2 AND o Who has moderate to severe immune compromise due to a medical condition or receipt of immunosuppressive medications or treatments and may not mount an adequate immune response to COVID-19 vaccination or  o Vaccination with any available COVID-19 vaccine, according to the approved or authorized schedule, is not recommended due to a history of severe adverse reaction (e.g., severe allergic reaction) to a COVID-19 vaccine(s) and/or COVID-19 vaccine component(s).  o Patient meets the following definition of mod-severe immune compromised status: 7. Solid tumor malignancies on immunomodulatory chemotherapy or advanced AIDS   I have spoken and communicated the following to the patient or parent/caregiver regarding COVID monoclonal antibody treatment:  1. FDA has authorized the emergency use of tixagevimab/cilgavimab for the pre-exposure prophylaxis of COVID-19 in patients with moderate-severe immunocompromised status, who meet above EUA criteria.  2. The significant known and potential risks and benefits of COVID monoclonal antibody, and the extent to which such potential risks and benefits are unknown.  3. Information on available alternative treatments and the risks and benefits of those alternatives, including clinical trials.  4. The patient or parent/caregiver has the option to accept or refuse COVID  monoclonal antibody treatment.  After reviewing this information with the patient, agree to receive tixagevimab/cilgavimab.  [name] is scheduled on 07/10/2020 to receive this injection.    Scot Dock, NP, 07/08/2020, 12:13 PM

## 2020-07-09 ENCOUNTER — Inpatient Hospital Stay: Payer: 59 | Attending: Oncology

## 2020-07-09 DIAGNOSIS — G40909 Epilepsy, unspecified, not intractable, without status epilepticus: Secondary | ICD-10-CM | POA: Insufficient documentation

## 2020-07-09 DIAGNOSIS — I10 Essential (primary) hypertension: Secondary | ICD-10-CM | POA: Insufficient documentation

## 2020-07-09 DIAGNOSIS — Z79899 Other long term (current) drug therapy: Secondary | ICD-10-CM | POA: Insufficient documentation

## 2020-07-09 DIAGNOSIS — M1990 Unspecified osteoarthritis, unspecified site: Secondary | ICD-10-CM | POA: Insufficient documentation

## 2020-07-09 DIAGNOSIS — R5383 Other fatigue: Secondary | ICD-10-CM | POA: Insufficient documentation

## 2020-07-09 DIAGNOSIS — Z5111 Encounter for antineoplastic chemotherapy: Secondary | ICD-10-CM | POA: Insufficient documentation

## 2020-07-09 DIAGNOSIS — Z9049 Acquired absence of other specified parts of digestive tract: Secondary | ICD-10-CM | POA: Insufficient documentation

## 2020-07-09 DIAGNOSIS — Z5189 Encounter for other specified aftercare: Secondary | ICD-10-CM | POA: Insufficient documentation

## 2020-07-09 DIAGNOSIS — Z171 Estrogen receptor negative status [ER-]: Secondary | ICD-10-CM | POA: Insufficient documentation

## 2020-07-09 DIAGNOSIS — Z298 Encounter for other specified prophylactic measures: Secondary | ICD-10-CM | POA: Insufficient documentation

## 2020-07-09 DIAGNOSIS — C50512 Malignant neoplasm of lower-outer quadrant of left female breast: Secondary | ICD-10-CM | POA: Insufficient documentation

## 2020-07-09 DIAGNOSIS — R5381 Other malaise: Secondary | ICD-10-CM | POA: Insufficient documentation

## 2020-07-09 DIAGNOSIS — Z7952 Long term (current) use of systemic steroids: Secondary | ICD-10-CM | POA: Insufficient documentation

## 2020-07-10 ENCOUNTER — Other Ambulatory Visit: Payer: Self-pay

## 2020-07-10 ENCOUNTER — Inpatient Hospital Stay: Payer: 59

## 2020-07-10 DIAGNOSIS — Z79899 Other long term (current) drug therapy: Secondary | ICD-10-CM | POA: Diagnosis not present

## 2020-07-10 DIAGNOSIS — Z298 Encounter for other specified prophylactic measures: Secondary | ICD-10-CM | POA: Diagnosis not present

## 2020-07-10 DIAGNOSIS — Z7952 Long term (current) use of systemic steroids: Secondary | ICD-10-CM | POA: Diagnosis not present

## 2020-07-10 DIAGNOSIS — R5381 Other malaise: Secondary | ICD-10-CM | POA: Diagnosis not present

## 2020-07-10 DIAGNOSIS — Z5111 Encounter for antineoplastic chemotherapy: Secondary | ICD-10-CM | POA: Diagnosis not present

## 2020-07-10 DIAGNOSIS — C50919 Malignant neoplasm of unspecified site of unspecified female breast: Secondary | ICD-10-CM

## 2020-07-10 DIAGNOSIS — M1990 Unspecified osteoarthritis, unspecified site: Secondary | ICD-10-CM | POA: Diagnosis not present

## 2020-07-10 DIAGNOSIS — I10 Essential (primary) hypertension: Secondary | ICD-10-CM | POA: Diagnosis not present

## 2020-07-10 DIAGNOSIS — Z9049 Acquired absence of other specified parts of digestive tract: Secondary | ICD-10-CM | POA: Diagnosis not present

## 2020-07-10 DIAGNOSIS — C50512 Malignant neoplasm of lower-outer quadrant of left female breast: Secondary | ICD-10-CM | POA: Diagnosis present

## 2020-07-10 DIAGNOSIS — G40909 Epilepsy, unspecified, not intractable, without status epilepticus: Secondary | ICD-10-CM | POA: Diagnosis not present

## 2020-07-10 DIAGNOSIS — R5383 Other fatigue: Secondary | ICD-10-CM | POA: Diagnosis not present

## 2020-07-10 DIAGNOSIS — Z5189 Encounter for other specified aftercare: Secondary | ICD-10-CM | POA: Diagnosis not present

## 2020-07-10 DIAGNOSIS — Z171 Estrogen receptor negative status [ER-]: Secondary | ICD-10-CM | POA: Diagnosis not present

## 2020-07-10 MED ORDER — CILGAVIMAB (PART OF EVUSHELD) INJECTION
300.0000 mg | Freq: Once | INTRAMUSCULAR | Status: AC
  Administered 2020-07-10: 300 mg via INTRAMUSCULAR
  Filled 2020-07-10: qty 3

## 2020-07-10 MED ORDER — TIXAGEVIMAB (PART OF EVUSHELD) INJECTION
300.0000 mg | Freq: Once | INTRAMUSCULAR | Status: AC
  Administered 2020-07-10: 300 mg via INTRAMUSCULAR
  Filled 2020-07-10: qty 3

## 2020-07-10 NOTE — Progress Notes (Signed)
1155: Pt has received Covid 19 vaccine and booster injections. Per Wilber Bihari NP okay to proceed with Evusheld injections.   1302: Pt tolerated Evusheld injections well, and monitored 1 hour post injections. No s/s of distress or reaction noted. Injection sites WNL with no pain, redness or swelling noted. Pt and VS stable at discharge.

## 2020-07-11 ENCOUNTER — Encounter: Payer: 59 | Admitting: Licensed Clinical Social Worker

## 2020-07-11 ENCOUNTER — Other Ambulatory Visit: Payer: 59

## 2020-07-12 ENCOUNTER — Ambulatory Visit
Admission: RE | Admit: 2020-07-12 | Discharge: 2020-07-12 | Disposition: A | Discharge status: Home / Self Care | Payer: 59 | Source: Ambulatory Visit | Attending: Oncology | Admitting: Oncology

## 2020-07-12 ENCOUNTER — Other Ambulatory Visit: Payer: Self-pay

## 2020-07-12 DIAGNOSIS — Z7189 Other specified counseling: Secondary | ICD-10-CM | POA: Diagnosis not present

## 2020-07-12 MED ORDER — TECHNETIUM TC 99M-LABELED RED BLOOD CELLS IV KIT
20.0000 | PACK | Freq: Once | INTRAVENOUS | Status: AC | PRN
  Administered 2020-07-12: 20.11 via INTRAVENOUS

## 2020-07-14 ENCOUNTER — Other Ambulatory Visit: Payer: Self-pay | Admitting: *Deleted

## 2020-07-14 DIAGNOSIS — C50919 Malignant neoplasm of unspecified site of unspecified female breast: Secondary | ICD-10-CM

## 2020-07-15 ENCOUNTER — Inpatient Hospital Stay: Payer: 59

## 2020-07-15 ENCOUNTER — Inpatient Hospital Stay (HOSPITAL_BASED_OUTPATIENT_CLINIC_OR_DEPARTMENT_OTHER): Payer: 59 | Admitting: Oncology

## 2020-07-15 ENCOUNTER — Other Ambulatory Visit: Payer: Self-pay

## 2020-07-15 VITALS — BP 132/84 | HR 83 | Temp 96.8°F | Resp 20 | Wt 190.2 lb

## 2020-07-15 DIAGNOSIS — C50512 Malignant neoplasm of lower-outer quadrant of left female breast: Secondary | ICD-10-CM

## 2020-07-15 DIAGNOSIS — Z171 Estrogen receptor negative status [ER-]: Secondary | ICD-10-CM | POA: Diagnosis not present

## 2020-07-15 DIAGNOSIS — Z5111 Encounter for antineoplastic chemotherapy: Secondary | ICD-10-CM | POA: Diagnosis not present

## 2020-07-15 DIAGNOSIS — C50919 Malignant neoplasm of unspecified site of unspecified female breast: Secondary | ICD-10-CM

## 2020-07-15 LAB — CBC WITH DIFFERENTIAL/PLATELET
Abs Immature Granulocytes: 0.01 10*3/uL (ref 0.00–0.07)
Basophils Absolute: 0 10*3/uL (ref 0.0–0.1)
Basophils Relative: 1 %
Eosinophils Absolute: 0.2 10*3/uL (ref 0.0–0.5)
Eosinophils Relative: 3 %
HCT: 39.1 % (ref 36.0–46.0)
Hemoglobin: 13 g/dL (ref 12.0–15.0)
Immature Granulocytes: 0 %
Lymphocytes Relative: 33 %
Lymphs Abs: 2.3 10*3/uL (ref 0.7–4.0)
MCH: 30.3 pg (ref 26.0–34.0)
MCHC: 33.2 g/dL (ref 30.0–36.0)
MCV: 91.1 fL (ref 80.0–100.0)
Monocytes Absolute: 0.6 10*3/uL (ref 0.1–1.0)
Monocytes Relative: 9 %
Neutro Abs: 3.7 10*3/uL (ref 1.7–7.7)
Neutrophils Relative %: 54 %
Platelets: 254 10*3/uL (ref 150–400)
RBC: 4.29 MIL/uL (ref 3.87–5.11)
RDW: 13.2 % (ref 11.5–15.5)
WBC: 6.9 10*3/uL (ref 4.0–10.5)
nRBC: 0 % (ref 0.0–0.2)

## 2020-07-15 LAB — COMPREHENSIVE METABOLIC PANEL
ALT: 29 U/L (ref 0–44)
AST: 24 U/L (ref 15–41)
Albumin: 4.3 g/dL (ref 3.5–5.0)
Alkaline Phosphatase: 76 U/L (ref 38–126)
Anion gap: 9 (ref 5–15)
BUN: 25 mg/dL — ABNORMAL HIGH (ref 8–23)
CO2: 25 mmol/L (ref 22–32)
Calcium: 9.3 mg/dL (ref 8.9–10.3)
Chloride: 105 mmol/L (ref 98–111)
Creatinine, Ser: 0.81 mg/dL (ref 0.44–1.00)
GFR, Estimated: >60 mL/min (ref >60)
Glucose, Bld: 104 mg/dL — ABNORMAL HIGH (ref 70–99)
Potassium: 3.8 mmol/L (ref 3.5–5.1)
Sodium: 139 mmol/L (ref 135–145)
Total Bilirubin: 0.5 mg/dL (ref 0.3–1.2)
Total Protein: 7.2 g/dL (ref 6.5–8.1)

## 2020-07-15 LAB — HEPATITIS B SURFACE ANTIGEN: Hepatitis B Surface Ag: NONREACTIVE

## 2020-07-15 LAB — HEPATITIS B CORE ANTIBODY, TOTAL: Hep B Core Total Ab: NONREACTIVE

## 2020-07-15 MED ORDER — SODIUM CHLORIDE 0.9 % IV SOLN
Freq: Once | INTRAVENOUS | Status: AC
Start: 2020-07-15 — End: 2020-07-15
  Filled 2020-07-15: qty 250

## 2020-07-15 MED ORDER — SODIUM CHLORIDE 0.9 % IV SOLN
10.0000 mg | Freq: Once | INTRAVENOUS | Status: AC
  Administered 2020-07-15: 10 mg via INTRAVENOUS
  Filled 2020-07-15: qty 10

## 2020-07-15 MED ORDER — HEPARIN SOD (PORK) LOCK FLUSH 100 UNIT/ML IV SOLN
500.0000 [IU] | Freq: Once | INTRAVENOUS | Status: AC | PRN
  Administered 2020-07-15: 500 [IU]
  Filled 2020-07-15: qty 5

## 2020-07-15 MED ORDER — SODIUM CHLORIDE 0.9 % IV SOLN
600.0000 mg/m2 | Freq: Once | INTRAVENOUS | Status: AC
  Administered 2020-07-15: 1200 mg via INTRAVENOUS
  Filled 2020-07-15: qty 50

## 2020-07-15 MED ORDER — DOXORUBICIN HCL CHEMO IV INJECTION 2 MG/ML
60.0000 mg/m2 | Freq: Once | INTRAVENOUS | Status: AC
  Administered 2020-07-15: 120 mg via INTRAVENOUS
  Filled 2020-07-15: qty 60

## 2020-07-15 MED ORDER — PALONOSETRON HCL INJECTION 0.25 MG/5ML
0.2500 mg | Freq: Once | INTRAVENOUS | Status: AC
  Administered 2020-07-15: 0.25 mg via INTRAVENOUS
  Filled 2020-07-15: qty 5

## 2020-07-15 MED ORDER — SODIUM CHLORIDE 0.9% FLUSH
10.0000 mL | INTRAVENOUS | Status: DC | PRN
  Filled 2020-07-15: qty 10

## 2020-07-15 MED ORDER — SODIUM CHLORIDE 0.9 % IV SOLN
150.0000 mg | Freq: Once | INTRAVENOUS | Status: AC
  Administered 2020-07-15: 150 mg via INTRAVENOUS
  Filled 2020-07-15: qty 150

## 2020-07-15 MED ORDER — PEGFILGRASTIM 6 MG/0.6ML ~~LOC~~ PSKT
6.0000 mg | PREFILLED_SYRINGE | Freq: Once | SUBCUTANEOUS | Status: AC
  Administered 2020-07-15: 6 mg via SUBCUTANEOUS
  Filled 2020-07-15: qty 0.6

## 2020-07-15 NOTE — Progress Notes (Signed)
Met patient in infusion suite.  Very positive about her treatment.   She was able to find additional resource for wigs, and head coverings in [place], in addition to [facility] in [place]. .  

## 2020-07-15 NOTE — Progress Notes (Signed)
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 This was followed by diagnostic mammogram and ultrasound which showed a 7 x 7 x 5 mm hypoechoic mass in the 7 o'clock position of the left breast 8 cm from the nipple. Ultrasound of the left axilla demonstrates 1 normal-appearing left axillary lymph nodes including 1 with borderline diffuse cortical thickening measuring 3.8 mm in maximum thickness. NoLymph nodes with focal or eccentric cortical thickening were seen. The suspicious breast mass was biopsied and was consistent with triple negative grade 3 invasive mammary carcinoma.  1 cm invasive mammary carcinoma that was grade 2 with negative margins.  1 sentinel lymph node was negative for malignancy.  Patient underwent lumpectomy with sentinel lymph node biopsy.  Final pathology showed 1.1 cm grade 2 invasive mammary carcinoma with negative margins.  1 sentinel lymph node was negative for malignancy.  PT1c  pN 0  Interval history-patient presently reports doing well and denies any new complaints at this time.  She has some baseline joint pain  ECOG PS- 1 Pain scale- 0   Review of systems- Review of Systems  Constitutional: Negative for chills, fever, malaise/fatigue and weight loss.  HENT: Negative for congestion,  ear discharge and nosebleeds.   Eyes: Negative for blurred vision.  Respiratory: Negative for cough, hemoptysis, sputum production, shortness of breath and wheezing.   Cardiovascular: Negative for chest pain, palpitations, orthopnea and claudication.  Gastrointestinal: Negative for abdominal pain, blood in stool, constipation, diarrhea, heartburn, melena, nausea and vomiting.  Genitourinary: Negative for dysuria, flank pain, frequency, hematuria and urgency.  Musculoskeletal: Positive for joint pain. Negative for back pain and myalgias.  Skin: Negative for rash.  Neurological: Negative for dizziness, tingling, focal weakness, seizures, weakness and headaches.  Endo/Heme/Allergies: Does not bruise/bleed easily.  Psychiatric/Behavioral: Negative for depression and suicidal ideas. The patient does not have insomnia.       Allergies  Allergen Reactions  . Keppra [Levetiracetam]   . Tegretol [Carbamazepine]      Past Medical History:  Diagnosis Date  . Allergy   . AR (allergic rhinitis)   . Arthritis   . Family history of brain cancer   . Family history of lung cancer   . Hair loss   . Hypertension    x 5years  . Migraine   . Psoriasis   . Seizure ([place])    x 17years  . Seizure disorder ([place])   . Vitamin D deficiency      Past Surgical History:  Procedure Laterality Date  . BREAST BIOPSY Right    benign ? date-before 2006  . BREAST BIOPSY Left 05/30/2020   [place] bx, coil marker, path pending  . BREAST LUMPECTOMY WITH RADIOACTIVE SEED AND SENTINEL LYMPH NODE BIOPSY Left 06/24/2020   Procedure: LEFT BREAST LUMPECTOMY WITH RADIOACTIVE SEED AND LEFT AXILLARY SENTINEL LYMPH NODE BIOPSY;  Surgeon: Rolm Bookbinder, MD;  Location: [facility];  Service: General;  Laterality: Left;  . CESAREAN SECTION    . CHOLECYSTECTOMY    . KNEE SURGERY    . PORTACATH PLACEMENT Right 06/24/2020   Procedure: INSERTION PORT-A-CATH;  Surgeon: Rolm Bookbinder, MD;  Location: [facility];  Service: General;  Laterality: Right;  . TONSILLECTOMY    . TUBAL LIGATION      Social History   Socioeconomic History  . Marital status: Married    Spouse name: Event organiser  . Number of children: 3  . Years of education: College  . Highest education level: Not on file  Occupational History  . Occupation:      Employer: OTHER    Comment: Publishing copy  Tobacco Use  . Smoking status: Never Smoker  . Smokeless tobacco: Never Used  Vaping Use  . Vaping Use: Never used  Substance and Sexual Activity  . Alcohol use: Yes    Comment: Rarely- wine  . Drug use: No  . Sexual activity: Yes    Birth control/protection: Post-menopausal  Other Topics Concern  . Not on file  Social History Narrative   Patient lives at home with her family.   Caffeine Use: 1 cups of coffee daily   Social Determinants of Health   Financial Resource Strain: Not on file  Food Insecurity: Not on file  Transportation Needs: Not on file  Physical Activity: Not on file  Stress: Not on file  Social Connections: Not on file  Intimate Partner Violence: Not on file    Family History  Problem Relation Age of Onset  . Brain cancer Mother 78       tumor  . Diabetes Father   . Heart disease Father   . Lung cancer Father   . Cancer Paternal Aunt   . Diabetes Maternal Grandmother   . Stroke Maternal Grandfather   . Breast cancer Neg Hx      Current Outpatient Medications:  .  fluticasone (FLONASE) 50 MCG/ACT nasal spray, Place 2 sprays into both nostrils daily., Disp: , Rfl:  .  LAMICTAL XR 200 MG TB24, Take 1 tablet (200 mg total) by mouth 2 (two) times daily. Brand Medically Necessary, Disp: 180 tablet, Rfl: 3 .  lidocaine-prilocaine (EMLA) cream, Apply to affected area once, Disp: 30 g, Rfl: 3 .  lisinopril-hydrochlorothiazide (ZESTORETIC) 10-12.5 MG tablet, Take 1 tablet by mouth daily. Pt states 1/2 tablet a day, Disp: , Rfl:  .  loratadine (CLARITIN) 10 MG tablet,  Take 10 mg by mouth daily., Disp: , Rfl:  .  Multiple Vitamin (MULTI-VITAMINS) TABS, Take 1 tablet by mouth daily. , Disp: , Rfl:  .  pantoprazole (PROTONIX) 20 MG tablet, Take 20 mg by mouth daily., Disp: , Rfl:  .  cetirizine (ZYRTEC) 10 MG tablet, Take 10 mg by mouth daily. (Patient not taking: Reported on 07/15/2020), Disp: , Rfl:  .  dexamethasone (DECADRON) 4 MG tablet, Take 2 tablets (8 mg total) by mouth daily. Take daily for 3 days after chemo. Take with food. (Patient not taking: Reported on 07/15/2020), Disp: 30 tablet, Rfl: 1 .  LORazepam (ATIVAN) 0.5 MG tablet, Take 1 tablet (0.5 mg total) by mouth every 6 (six) hours as needed (Nausea or vomiting). (Patient not taking: Reported on 07/15/2020), Disp: 30 tablet, Rfl: 0 .  ondansetron (ZOFRAN) 8 MG tablet, Take 1 tablet (8 mg total) by mouth 2 (two) times daily as needed. Start on the third day after chemotherapy. (Patient not taking: Reported on 07/15/2020),  Disp: 30 tablet, Rfl: 1 .  oxyCODONE (OXY IR/ROXICODONE) 5 MG immediate release tablet, Take 1 tablet (5 mg total) by mouth every 6 (six) hours as needed. (Patient not taking: Reported on 07/15/2020), Disp: 12 tablet, Rfl: 0 .  prochlorperazine (COMPAZINE) 10 MG tablet, Take 1 tablet (10 mg total) by mouth every 6 (six) hours as needed (Nausea or vomiting). (Patient not taking: Reported on 07/15/2020), Disp: 30 tablet, Rfl: 1 No current facility-administered medications for this visit.  Facility-Administered Medications Ordered in Other Visits:  .  cyclophosphamide (CYTOXAN) 1,200 mg in sodium chloride 0.9 % 250 mL chemo infusion, 600 mg/m2 (Treatment Plan Recorded), Intravenous, Once, Sindy Guadeloupe, MD .  dexamethasone (DECADRON) 10 mg in sodium chloride 0.9 % 50 mL IVPB, 10 mg, Intravenous, Once, Sindy Guadeloupe, MD .  DOXOrubicin (ADRIAMYCIN) chemo injection 120 mg, 60 mg/m2 (Treatment Plan Recorded), Intravenous, Once, Sindy Guadeloupe, MD .  fosaprepitant (EMEND) 150 mg in sodium chloride  0.9 % 145 mL IVPB, 150 mg, Intravenous, Once, Sindy Guadeloupe, MD .  heparin lock flush 100 unit/mL, 500 Units, Intracatheter, Once PRN, Sindy Guadeloupe, MD .  palonosetron (ALOXI) injection 0.25 mg, 0.25 mg, Intravenous, Once, Sindy Guadeloupe, MD .  pegfilgrastim (NEULASTA ONPRO KIT) injection 6 mg, 6 mg, Subcutaneous, Once, Sindy Guadeloupe, MD .  sodium chloride flush (NS) 0.9 % injection 10 mL, 10 mL, Intracatheter, PRN, Sindy Guadeloupe, MD  Physical exam:  Vitals:   07/15/20 0859  BP: 132/84  Pulse: 83  Resp: 20  Temp: (!) 96.8 F (36 C)  TempSrc: Tympanic  SpO2: 96%  Weight: 190 lb 3.2 oz (86.3 kg)   Physical Exam Cardiovascular:     Rate and Rhythm: Normal rate and regular rhythm.     Heart sounds: Normal heart sounds.  Pulmonary:     Effort: Pulmonary effort is normal.     Breath sounds: Normal breath sounds.     Comments: Right chest wall port in place Skin:    General: Skin is warm and dry.  Neurological:     Mental Status: She is alert and oriented to person, place, and time.      CMP Latest Ref Rng & Units 07/15/2020  Glucose 70 - 99 mg/dL 104(H)  BUN 8 - 23 mg/dL 25(H)  Creatinine 0.44 - 1.00 mg/dL 0.81  Sodium 135 - 145 mmol/L 139  Potassium 3.5 - 5.1 mmol/L 3.8  Chloride 98 - 111 mmol/L 105  CO2 22 - 32 mmol/L 25  Calcium 8.9 - 10.3 mg/dL 9.3  Total Protein 6.5 - 8.1 g/dL 7.2  Total Bilirubin 0.3 - 1.2 mg/dL 0.5  Alkaline Phos 38 - 126 U/L 76  AST 15 - 41 U/L 24  ALT 0 - 44 U/L 29   CBC Latest Ref Rng & Units 07/15/2020  WBC 4.0 - 10.5 K/uL 6.9  Hemoglobin 12.0 - 15.0 g/dL 13.0  Hematocrit 36.0 - 46.0 % 39.1  Platelets 150 - 400 K/uL 254    No images are attached to the encounter.  DG Chest 1 View  Result Date: 06/24/2020 CLINICAL DATA:  Surgery, Port-A-Cath insertion. EXAM: DG C-ARM 1-60 MIN; CHEST  1 VIEW FLUOROSCOPY TIME:  Fluoroscopy Time:  19 seconds Radiation Exposure Index (if provided by the fluoroscopic device): 1.62 mGy Number of Acquired  Spot Images: 1 COMPARISON:  None. FINDINGS: Single fluoroscopic spot view obtained in the operating room demonstrates a right-sided chest port in place. The tip is in the region  of the mid SVC. IMPRESSION: Procedural fluoroscopy during right chest port insertion. Electronically Signed   By: Keith Rake M.D.   On: 06/24/2020 18:42   NM Cardiac Muga Rest  Result Date: 07/12/2020 CLINICAL DATA:  Breast cancer, pre cardiotoxic chemotherapy EXAM: NUCLEAR MEDICINE CARDIAC BLOOD POOL IMAGING (MUGA) TECHNIQUE: Cardiac multi-gated acquisition was performed at rest following intravenous injection of Tc-42mlabeled red blood cells. RADIOPHARMACEUTICALS:  20.11 mCi [id number] in-vitro labeled red blood cells IV COMPARISON:  None FINDINGS: Calculated LEFT ventricular ejection fraction is 55%, normal. Study was obtained at a cardiac rate of 82 bpm. Patient was rhythmic during imaging. Cine analysis of the LEFT ventricle in 3 projections demonstrates normal LEFT ventricular wall motion. IMPRESSION: Normal LVEF of 55%. Normal LEFT ventricular wall motion. Electronically Signed   By: MaLavonia Dana.D.   On: 07/12/2020 16:00   NM Sentinel Node Inj-No Rpt (Breast)  Result Date: 06/24/2020 Sulfur colloid was injected by the nuclear medicine technologist for melanoma sentinel node.   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 EXAM: SPECIMEN RADIOGRAPH OF THE LEFT BREAST COMPARISON:  Previous exam(s). FINDINGS: Status post excision of the LEFT breast. The radioactive seed and COIL biopsy marker clip are present and completely intact. IMPRESSION: Specimen radiograph of the LEFT breast. Electronically Signed   By: JeMargarette Canada.D.   On: 06/24/2020 12:37   DG C-Arm 1-60 Min  Result Date: 06/24/2020 CLINICAL DATA:  Surgery, Port-A-Cath insertion. EXAM: DG C-ARM 1-60 MIN; CHEST  1 VIEW FLUOROSCOPY TIME:  Fluoroscopy Time:  19  seconds Radiation Exposure Index (if provided by the fluoroscopic device): 1.62 mGy Number of Acquired Spot Images: 1 COMPARISON:  None. FINDINGS: Single fluoroscopic spot view obtained in the operating room demonstrates a right-sided chest port in place. The tip is in the region of the mid SVC. IMPRESSION: Procedural fluoroscopy during right chest port insertion. Electronically Signed   By: MeKeith Rake.D.   On: 06/24/2020 18:42   MM LT RADIOACTIVE SEED LOC MAMMO GUIDE  Result Date: 06/21/2020 CLINICAL DATA:  Recently diagnosed LEFT breast cancer scheduled for lumpectomy requiring preoperative radioactive seed localization. EXAM: MAMMOGRAPHIC GUIDED RADIOACTIVE SEED LOCALIZATION OF THE LEFT BREAST COMPARISON:  Previous exam(s). FINDINGS: Patient presents for radioactive seed localization prior to lumpectomy. I met with the patient and we discussed the procedure of seed localization including benefits and alternatives. We discussed the high likelihood of a successful procedure. We discussed the risks of the procedure including infection, bleeding, tissue injury and further surgery. We discussed the low dose of radioactivity involved in the procedure. Informed, written consent was given. The usual time-out protocol was performed immediately prior to the procedure. Using mammographic guidance, sterile technique, 1% lidocaine and an I-125 radioactive seed, the mass with coil shaped clip in the lower inner quadrant of the LEFT breast was localized using a medial approach. The follow-up mammogram images confirm the seed in the expected location and were marked for Dr. WaDonne HazelFollow-up survey of the patient confirms presence of the radioactive seed. Order number of I-125 seed:  [id number] activity:  0.1.275illicuries reference Date: 06/06/2020 The patient tolerated the procedure well and was released from the BrBig SandyShe was given instructions regarding seed removal. IMPRESSION: Radioactive seed  localization left breast. No apparent complications. Electronically Signed   By: StFranki Cabot.D.   On: 06/21/2020 15:44     Assessment and plan- Patient is a  65 y.o. female with pathological prognostic stage Ib invasive mammary carcinoma triple negative pT1 cpN0 cM0 s/p lumpectomy and sentinel lymph node biopsy.  She is here for on treatment assessment prior to cycle 1 of dose dense AC chemotherapy  Counts okay to proceed with cycle 1 of dose dense AC chemotherapy today with on pro-Neulasta support.  I will see her back in 2 weeks for cycle 2.  Discussed risks and benefits of chemotherapy including all but not limited to nausea, vomiting, low blood counts, risk of infections and hospitalization.  Risk of flulike symptoms, back pain and leg pain can likely be associated with Neulasta.  Treatment will be given with a curative intent.  Patient understands and agrees to proceed as planned.  Her base echocardiogram was normal.  Chemo induced nausea prophylaxis: She has as needed Zofran Compazine and Ativan at home   Visit Diagnosis 1. Encounter for antineoplastic chemotherapy   2. Malignant neoplasm of lower-outer quadrant of left breast of female, estrogen receptor negative ([place])      Dr. [name], MD, MPH [facility] at [facility] [phone] 07/15/2020 10:12 AM

## 2020-07-15 NOTE — Progress Notes (Signed)
Pt tolerated all infusions well today with no problems or complaints.  Neulasta onpro applied to right arm to inject tomorrow as ordered.  Reviewed onpro with patient, verbalized understanding, all questions answered.  Pt left infusion suite stable and ambulatory.

## 2020-07-16 ENCOUNTER — Encounter: Payer: Self-pay | Admitting: Oncology

## 2020-07-16 ENCOUNTER — Telehealth: Payer: Self-pay

## 2020-07-16 LAB — HEPATITIS B SURFACE ANTIBODY, QUANTITATIVE: Hep B S AB Quant (Post): <3.1 m[IU]/mL — ABNORMAL LOW (ref >9.9)

## 2020-07-16 NOTE — Telephone Encounter (Signed)
Telephone call to patient for follow up after receiving first infusion.   Patient states infusion went great.  States eating good and drinking plenty of fluids.   Denies any nausea or vomiting.  Encouraged patient to call for any concerns or questions. 

## 2020-07-17 ENCOUNTER — Telehealth: Payer: Self-pay | Admitting: *Deleted

## 2020-07-17 ENCOUNTER — Other Ambulatory Visit: Payer: Self-pay | Admitting: *Deleted

## 2020-07-17 MED ORDER — OXYCODONE HCL 5 MG PO TABS: 5.0000 mg | ORAL_TABLET | Freq: Four times a day (QID) | ORAL | 0 refills | Status: DC | PRN

## 2020-07-17 NOTE — Telephone Encounter (Signed)
Per Dr Janese Banks, ask patient if she wants Tramadol 50 mg every 6 hours as needed # 30 or Oxycodone 5 mg every 6 hours as needed #30. I spoke with patient and she chose the Oxycodone stating that she has had Tramadol in past and it did not seem to help much. Order sent to Dr Janese Banks for signature and send to pharmacy

## 2020-07-17 NOTE — Telephone Encounter (Signed)
Patient called reporting that she has had aching of her head, shoulders and neck for 3 days and is asking for something stronger that Tylenol. Please advise

## 2020-07-20 ENCOUNTER — Other Ambulatory Visit: Payer: Self-pay | Admitting: Oncology

## 2020-07-20 DIAGNOSIS — C50512 Malignant neoplasm of lower-outer quadrant of left female breast: Secondary | ICD-10-CM

## 2020-07-22 ENCOUNTER — Encounter: Payer: Self-pay | Admitting: Oncology

## 2020-07-23 ENCOUNTER — Telehealth: Payer: Self-pay | Admitting: Licensed Clinical Social Worker

## 2020-07-23 NOTE — Telephone Encounter (Signed)
Called Anna Stewart and her husband to let them know that her genetic testing has been delayed, but should report out very soon. Let them know I will call as soon as I have testing ready for them and then we have it uploaded to [place].

## 2020-07-29 ENCOUNTER — Other Ambulatory Visit: Payer: Self-pay

## 2020-07-29 ENCOUNTER — Inpatient Hospital Stay (HOSPITAL_BASED_OUTPATIENT_CLINIC_OR_DEPARTMENT_OTHER): Payer: 59 | Admitting: Oncology

## 2020-07-29 ENCOUNTER — Encounter: Payer: Self-pay | Admitting: Oncology

## 2020-07-29 ENCOUNTER — Inpatient Hospital Stay: Payer: 59

## 2020-07-29 VITALS — BP 124/78 | HR 80 | Temp 96.0°F | Resp 20 | Wt 185.0 lb

## 2020-07-29 DIAGNOSIS — C50512 Malignant neoplasm of lower-outer quadrant of left female breast: Secondary | ICD-10-CM

## 2020-07-29 DIAGNOSIS — Z5111 Encounter for antineoplastic chemotherapy: Secondary | ICD-10-CM | POA: Diagnosis not present

## 2020-07-29 DIAGNOSIS — Z171 Estrogen receptor negative status [ER-]: Secondary | ICD-10-CM | POA: Diagnosis not present

## 2020-07-29 LAB — COMPREHENSIVE METABOLIC PANEL
ALT: 25 U/L (ref 0–44)
AST: 20 U/L (ref 15–41)
Albumin: 3.9 g/dL (ref 3.5–5.0)
Alkaline Phosphatase: 74 U/L (ref 38–126)
Anion gap: 11 (ref 5–15)
BUN: 14 mg/dL (ref 8–23)
CO2: 24 mmol/L (ref 22–32)
Calcium: 9.1 mg/dL (ref 8.9–10.3)
Chloride: 103 mmol/L (ref 98–111)
Creatinine, Ser: 0.78 mg/dL (ref 0.44–1.00)
GFR, Estimated: >60 mL/min (ref >60)
Glucose, Bld: 109 mg/dL — ABNORMAL HIGH (ref 70–99)
Potassium: 3.9 mmol/L (ref 3.5–5.1)
Sodium: 138 mmol/L (ref 135–145)
Total Bilirubin: 0.3 mg/dL (ref 0.3–1.2)
Total Protein: 6.9 g/dL (ref 6.5–8.1)

## 2020-07-29 LAB — CBC WITH DIFFERENTIAL/PLATELET
Abs Immature Granulocytes: 0.1 10*3/uL — ABNORMAL HIGH (ref 0.00–0.07)
Basophils Absolute: 0.1 10*3/uL (ref 0.0–0.1)
Basophils Relative: 1 %
Eosinophils Absolute: 0 10*3/uL (ref 0.0–0.5)
Eosinophils Relative: 0 %
HCT: 36.7 % (ref 36.0–46.0)
Hemoglobin: 12.1 g/dL (ref 12.0–15.0)
Immature Granulocytes: 2 %
Lymphocytes Relative: 24 %
Lymphs Abs: 1.2 10*3/uL (ref 0.7–4.0)
MCH: 30.1 pg (ref 26.0–34.0)
MCHC: 33 g/dL (ref 30.0–36.0)
MCV: 91.3 fL (ref 80.0–100.0)
Monocytes Absolute: 0.8 10*3/uL (ref 0.1–1.0)
Monocytes Relative: 16 %
Neutro Abs: 2.8 10*3/uL (ref 1.7–7.7)
Neutrophils Relative %: 57 %
Platelets: 225 10*3/uL (ref 150–400)
RBC: 4.02 MIL/uL (ref 3.87–5.11)
RDW: 13.4 % (ref 11.5–15.5)
WBC: 4.9 10*3/uL (ref 4.0–10.5)
nRBC: 0 % (ref 0.0–0.2)

## 2020-07-29 MED ORDER — SODIUM CHLORIDE 0.9 % IV SOLN
10.0000 mg | Freq: Once | INTRAVENOUS | Status: AC
  Administered 2020-07-29: 10 mg via INTRAVENOUS
  Filled 2020-07-29: qty 10

## 2020-07-29 MED ORDER — HEPARIN SOD (PORK) LOCK FLUSH 100 UNIT/ML IV SOLN
500.0000 [IU] | Freq: Once | INTRAVENOUS | Status: AC | PRN
  Administered 2020-07-29: 500 [IU]
  Filled 2020-07-29: qty 5

## 2020-07-29 MED ORDER — SODIUM CHLORIDE 0.9 % IV SOLN
150.0000 mg | Freq: Once | INTRAVENOUS | Status: AC
  Administered 2020-07-29: 150 mg via INTRAVENOUS
  Filled 2020-07-29: qty 150

## 2020-07-29 MED ORDER — PALONOSETRON HCL INJECTION 0.25 MG/5ML
0.2500 mg | Freq: Once | INTRAVENOUS | Status: AC
  Administered 2020-07-29: 0.25 mg via INTRAVENOUS
  Filled 2020-07-29: qty 5

## 2020-07-29 MED ORDER — DOXORUBICIN HCL CHEMO IV INJECTION 2 MG/ML
60.0000 mg/m2 | Freq: Once | INTRAVENOUS | Status: AC
  Administered 2020-07-29: 120 mg via INTRAVENOUS
  Filled 2020-07-29: qty 50

## 2020-07-29 MED ORDER — SODIUM CHLORIDE 0.9 % IV SOLN
Freq: Once | INTRAVENOUS | Status: AC
Start: 2020-07-29 — End: 2020-07-29
  Filled 2020-07-29: qty 250

## 2020-07-29 MED ORDER — TRAMADOL HCL 50 MG PO TABS: 50.0000 mg | ORAL_TABLET | Freq: Four times a day (QID) | ORAL | 0 refills | Status: AC | PRN

## 2020-07-29 MED ORDER — PEGFILGRASTIM 6 MG/0.6ML ~~LOC~~ PSKT
6.0000 mg | PREFILLED_SYRINGE | Freq: Once | SUBCUTANEOUS | Status: AC
  Administered 2020-07-29: 6 mg via SUBCUTANEOUS
  Filled 2020-07-29: qty 0.6

## 2020-07-29 MED ORDER — SODIUM CHLORIDE 0.9 % IV SOLN
600.0000 mg/m2 | Freq: Once | INTRAVENOUS | Status: AC
  Administered 2020-07-29: 1200 mg via INTRAVENOUS
  Filled 2020-07-29: qty 50

## 2020-07-29 NOTE — Progress Notes (Signed)
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 This was followed by diagnostic mammogram and ultrasound which showed a 7 x 7 x 5 mm hypoechoic mass in the 7 o'clock position of the left breast 8 cm from the nipple. Ultrasound of the left axilla demonstrates 1 normal-appearing left axillary lymph nodes including 1 with borderline diffuse cortical thickening measuring 3.8 mm in maximum thickness. NoLymph nodes with focal or eccentric cortical thickening were seen. The suspicious breast mass was biopsied and was consistent with triple negative grade 3 invasive mammary carcinoma.  1 cm invasive mammary carcinoma that was grade 2 with negative margins. 1 sentinel lymph node was negative for malignancy. Patient underwent lumpectomy with sentinel lymph node biopsy. Final pathology showed 1.1 cm grade 2 invasive mammary carcinoma with negative margins.  1 sentinel lymph node was negative for malignancy.  PT1c  pN 0  Interval history-patient had some headache following chemotherapy as well as some self-limited nausea.  Reports ongoing fatigue ECOG PS- 1 Pain scale- 0   Review of systems- Review of Systems  Constitutional: Positive for malaise/fatigue. Negative for chills, fever and weight loss.  HENT: Negative for  congestion, ear discharge and nosebleeds.   Eyes: Negative for blurred vision.  Respiratory: Negative for cough, hemoptysis, sputum production, shortness of breath and wheezing.   Cardiovascular: Negative for chest pain, palpitations, orthopnea and claudication.  Gastrointestinal: Positive for nausea. Negative for abdominal pain, blood in stool, constipation, diarrhea, heartburn, melena and vomiting.  Genitourinary: Negative for dysuria, flank pain, frequency, hematuria and urgency.  Musculoskeletal: Negative for back pain, joint pain and myalgias.  Skin: Negative for rash.  Neurological: Positive for headaches. Negative for dizziness, tingling, focal weakness, seizures and weakness.  Endo/Heme/Allergies: Does not bruise/bleed easily.  Psychiatric/Behavioral: Negative for depression and suicidal ideas. The patient does not have insomnia.      Allergies  Allergen Reactions  . Keppra [Levetiracetam]   . Tegretol [Carbamazepine]      Past Medical History:  Diagnosis Date  . Allergy   . AR (allergic rhinitis)   . Arthritis   . Family history of brain cancer   . Family history of lung cancer   . Hair loss   . Hypertension    x 5years  . Migraine   . Psoriasis   . Seizure ([place])    x 17years  . Seizure disorder ([place])   . Vitamin D deficiency      Past Surgical History:  Procedure Laterality Date  . BREAST BIOPSY Right    benign ? date-before 2006  . BREAST BIOPSY Left 05/30/2020   [place] bx, coil marker, path pending  . BREAST LUMPECTOMY WITH RADIOACTIVE SEED AND SENTINEL LYMPH NODE BIOPSY Left 06/24/2020   Procedure: LEFT BREAST LUMPECTOMY WITH RADIOACTIVE SEED AND LEFT AXILLARY SENTINEL LYMPH NODE BIOPSY;  Surgeon: Rolm Bookbinder, MD;  Location: [place];  Service:  General;  Laterality: Left;  . CESAREAN SECTION    . CHOLECYSTECTOMY    . KNEE SURGERY    . PORTACATH PLACEMENT Right 06/24/2020   Procedure: INSERTION PORT-A-CATH;  Surgeon: Rolm Bookbinder,  MD;  Location: [place];  Service: General;  Laterality: Right;  . TONSILLECTOMY    . TUBAL LIGATION      Social History   Socioeconomic History  . Marital status: Married    Spouse name: Event organiser  . Number of children: 3  . Years of education: College  . Highest education level: Not on file  Occupational History  . Occupation:      Employer: OTHER    Comment: Publishing copy  Tobacco Use  . Smoking status: Never Smoker  . Smokeless tobacco: Never Used  Vaping Use  . Vaping Use: Never used  Substance and Sexual Activity  . Alcohol use: Yes    Comment: Rarely- wine  . Drug use: No  . Sexual activity: Yes    Birth control/protection: Post-menopausal  Other Topics Concern  . Not on file  Social History Narrative   Patient lives at home with her family.   Caffeine Use: 1 cups of coffee daily   Social Determinants of Health   Financial Resource Strain: Not on file  Food Insecurity: Not on file  Transportation Needs: Not on file  Physical Activity: Not on file  Stress: Not on file  Social Connections: Not on file  Intimate Partner Violence: Not on file    Family History  Problem Relation Age of Onset  . Brain cancer Mother 41       tumor  . Diabetes Father   . Heart disease Father   . Lung cancer Father   . Cancer Paternal Aunt   . Diabetes Maternal Grandmother   . Stroke Maternal Grandfather   . Breast cancer Neg Hx      Current Outpatient Medications:  .  cetirizine (ZYRTEC) 10 MG tablet, Take 10 mg by mouth daily. (Patient not taking: Reported on 07/15/2020), Disp: , Rfl:  .  dexamethasone (DECADRON) 4 MG tablet, Take 2 tablets (8 mg total) by mouth daily. Take daily for 3 days after chemo. Take with food. (Patient not taking: Reported on 07/15/2020), Disp: 30 tablet, Rfl: 1 .  fluticasone (FLONASE) 50 MCG/ACT nasal spray, Place 2 sprays into both nostrils daily., Disp: , Rfl:  .  LAMICTAL XR 200 MG TB24, Take 1 tablet (200 mg total)  by mouth 2 (two) times daily. Brand Medically Necessary, Disp: 180 tablet, Rfl: 3 .  lidocaine-prilocaine (EMLA) cream, Apply to affected area once, Disp: 30 g, Rfl: 3 .  lisinopril-hydrochlorothiazide (ZESTORETIC) 10-12.5 MG tablet, Take 1 tablet by mouth daily. Pt states 1/2 tablet a day, Disp: , Rfl:  .  loratadine (CLARITIN) 10 MG tablet, Take 10 mg by mouth daily., Disp: , Rfl:  .  LORazepam (ATIVAN) 0.5 MG tablet, Take 1 tablet (0.5 mg total) by mouth every 6 (six) hours as needed (Nausea or vomiting). (Patient not taking: Reported on 07/15/2020), Disp: 30 tablet, Rfl: 0 .  Multiple Vitamin (MULTI-VITAMINS) TABS, Take 1 tablet by mouth daily. , Disp: , Rfl:  .  ondansetron (ZOFRAN) 8 MG tablet, Take 1 tablet (8 mg total) by mouth 2 (two) times daily as needed. Start on the third day after chemotherapy. (Patient not taking: Reported on 07/15/2020), Disp: 30 tablet, Rfl: 1 .  oxyCODONE (OXY IR/ROXICODONE) 5 MG immediate release tablet, Take 1 tablet (5 mg  total) by mouth every 6 (six) hours as needed., Disp: 30 tablet, Rfl: 0 .  pantoprazole (PROTONIX) 20 MG tablet, Take 20 mg by mouth daily., Disp: , Rfl:  .  prochlorperazine (COMPAZINE) 10 MG tablet, TAKE 1 TABLET(10 MG) BY MOUTH EVERY 6 HOURS AS NEEDED FOR NAUSEA OR VOMITING, Disp: 30 tablet, Rfl: 1  Physical exam:  Vitals:   07/29/20 0944  BP: 124/78  Pulse: 80  Resp: 20  Temp: (!) 96 F (35.6 C)  TempSrc: Tympanic  SpO2: 98%  Weight: 185 lb (83.9 kg)   Physical Exam Cardiovascular:     Rate and Rhythm: Normal rate and regular rhythm.     Heart sounds: Normal heart sounds.  Pulmonary:     Effort: Pulmonary effort is normal.  Skin:    General: Skin is warm and dry.  Neurological:     Mental Status: She is alert and oriented to person, place, and time.      CMP Latest Ref Rng & Units 07/29/2020  Glucose 70 - 99 mg/dL 109(H)  BUN 8 - 23 mg/dL 14  Creatinine 0.44 - 1.00 mg/dL 0.78  Sodium 135 - 145 mmol/L 138  Potassium 3.5  - 5.1 mmol/L 3.9  Chloride 98 - 111 mmol/L 103  CO2 22 - 32 mmol/L 24  Calcium 8.9 - 10.3 mg/dL 9.1  Total Protein 6.5 - 8.1 g/dL 6.9  Total Bilirubin 0.3 - 1.2 mg/dL 0.3  Alkaline Phos 38 - 126 U/L 74  AST 15 - 41 U/L 20  ALT 0 - 44 U/L 25   CBC Latest Ref Rng & Units 07/29/2020  WBC 4.0 - 10.5 K/uL 4.9  Hemoglobin 12.0 - 15.0 g/dL 12.1  Hematocrit 36.0 - 46.0 % 36.7  Platelets 150 - 400 K/uL 225    No images are attached to the encounter.  NM Cardiac Muga Rest  Result Date: 07/12/2020 CLINICAL DATA:  Breast cancer, pre cardiotoxic chemotherapy EXAM: NUCLEAR MEDICINE CARDIAC BLOOD POOL IMAGING (MUGA) TECHNIQUE: Cardiac multi-gated acquisition was performed at rest following intravenous injection of Tc-68mlabeled red blood cells. RADIOPHARMACEUTICALS:  20.11 mCi Tc-911mertechnetate in-vitro labeled red blood cells IV COMPARISON:  None FINDINGS: Calculated LEFT ventricular ejection fraction is 55%, normal. Study was obtained at a cardiac rate of 82 bpm. Patient was rhythmic during imaging. Cine analysis of the LEFT ventricle in 3 projections demonstrates normal LEFT ventricular wall motion. IMPRESSION: Normal LVEF of 55%. Normal LEFT ventricular wall motion. Electronically Signed   By: MaLavonia Dana.D.   On: 07/12/2020 16:00     Assessment and plan- Patient is a [age].o. female with pathological prognostic stage Ib invasive mammary carcinoma triple negative pT1 cpN0 cM0 s/p lumpectomy and sentinel lymph node biopsy.   She is here for on treatment assessment prior to cycle 2 of dose dense AC chemotherapy  Counts okay to proceed with cycle 2 of dose dense AC chemotherapy with on for Neulasta support.  I will see her back in 2 weeks for cycle 3.  Patient reports still take tramadol if she gets headaches instead of oxycodone.  I will send a prescription for that.  Chemo induced fatigue: Encourage physical activity and exercise as tolerated.   Visit Diagnosis 1. Encounter for  antineoplastic chemotherapy   2. Malignant neoplasm of lower-outer quadrant of left breast of female, estrogen receptor negative ([place]     Dr. ArRanda EvensMD, MPH [facility]St. [id number]/25/2022 12:43 PM

## 2020-07-29 NOTE — Patient Instructions (Addendum)
[facility]  Discharge Instructions: Thank you for choosing [facility] to provide your oncology and hematology care.  If you have a lab appointment with the [facility], please go directly to the [facility] and check in at the registration area.  Wear comfortable clothing and clothing appropriate for easy access to any Portacath or PICC line.   We strive to give you quality time with your provider. You may need to reschedule your appointment if you arrive late (15 or more minutes).  Arriving late affects you and other patients whose appointments are after yours.  Also, if you miss three or more appointments without notifying the office, you may be dismissed from the clinic at the provider's discretion.      For prescription refill requests, have your pharmacy contact our office and allow 72 hours for refills to be completed.    Today you received the following chemotherapy and/or immunotherapy agents: Adriamycin, Cytoxan, Neulasta   To help prevent nausea and vomiting after your treatment, we encourage you to take your nausea medication as directed.  BELOW ARE SYMPTOMS THAT SHOULD BE REPORTED IMMEDIATELY: . *FEVER GREATER THAN 100.4 F (38 C) OR HIGHER . *CHILLS OR SWEATING . *NAUSEA AND VOMITING THAT IS NOT CONTROLLED WITH YOUR NAUSEA MEDICATION . *UNUSUAL SHORTNESS OF BREATH . *UNUSUAL BRUISING OR BLEEDING . *URINARY PROBLEMS (pain or burning when urinating, or frequent urination) . *BOWEL PROBLEMS (unusual diarrhea, constipation, pain near the anus) . TENDERNESS IN MOUTH AND THROAT WITH OR WITHOUT PRESENCE OF ULCERS (sore throat, sores in mouth, or a toothache) . UNUSUAL RASH, SWELLING OR PAIN  . UNUSUAL VAGINAL DISCHARGE OR ITCHING   Items with * indicate a potential emergency and should be followed up as soon as possible or go to the Emergency Department if any problems should occur.  Please show the CHEMOTHERAPY ALERT CARD or  IMMUNOTHERAPY ALERT CARD at check-in to the Emergency Department and triage nurse.  Should you have questions after your visit or need to cancel or reschedule your appointment, please contact [facility]  [phone] and follow the prompts.  Office hours are 8:00 a.m. to 4:30 p.m. Monday - Friday. Please note that voicemails left after 4:00 p.m. may not be returned until the following business day.  We are closed weekends and major holidays. You have access to a nurse at all times for urgent questions. Please call the main number to the clinic [phone] and follow the prompts.  For any non-urgent questions, you may also contact your provider using MyChart. We now offer e-Visits for anyone 66 and older to request care online for non-urgent symptoms. For details visit [email or web address].   Also download the MyChart app! Go to the app store, search "MyChart", open the app, select [facility], and log in with your MyChart username and password.  Due to Covid, a mask is required upon entering the hospital/clinic. If you do not have a mask, one will be given to you upon arrival. For doctor visits, patients may have 1 support person aged 77 or older with them. For treatment visits, patients cannot have anyone with them due to current Covid guidelines and our immunocompromised population. Cyclophosphamide Injection What is this medicine? CYCLOPHOSPHAMIDE (sye kloe FOSS fa mide) is a chemotherapy drug. It slows the growth of cancer cells. This medicine is used to treat many types of cancer like lymphoma, myeloma, leukemia, breast cancer, and ovarian cancer, to name a few. This medicine  may be used for other purposes; ask your health care provider or pharmacist if you have questions. COMMON BRAND NAME(S): Cytoxan, Neosar What should I tell my health care provider before I take this medicine? They need to know if you have any of these conditions:  heart  disease  history of irregular heartbeat  infection  kidney disease  liver disease  low blood counts, like white cells, platelets, or red blood cells  on hemodialysis  recent or ongoing radiation therapy  scarring or thickening of the lungs  trouble passing urine  an unusual or allergic reaction to cyclophosphamide, other medicines, foods, dyes, or preservatives  pregnant or trying to get pregnant  breast-feeding How should I use this medicine? This drug is usually given as an injection into a vein or muscle or by infusion into a vein. It is administered in a hospital or clinic by a specially trained health care professional. Talk to your pediatrician regarding the use of this medicine in children. Special care may be needed. Overdosage: If you think you have taken too much of this medicine contact a poison control center or emergency room at once. NOTE: This medicine is only for you. Do not share this medicine with others. What if I miss a dose? It is important not to miss your dose. Call your doctor or health care professional if you are unable to keep an appointment. What may interact with this medicine?  amphotericin B  azathioprine  certain antivirals for HIV or hepatitis  certain medicines for blood pressure, heart disease, irregular heart beat  certain medicines that treat or prevent blood clots like warfarin  certain other medicines for cancer  cyclosporine  etanercept  indomethacin  medicines that relax muscles for surgery  medicines to increase blood counts  metronidazole This list may not describe all possible interactions. Give your health care provider a list of all the medicines, herbs, non-prescription drugs, or dietary supplements you use. Also tell them if you smoke, drink alcohol, or use illegal drugs. Some items may interact with your medicine. What should I watch for while using this medicine? Your condition will be monitored carefully  while you are receiving this medicine. You may need blood work done while you are taking this medicine. Drink water or other fluids as directed. Urinate often, even at night. Some products may contain alcohol. Ask your health care professional if this medicine contains alcohol. Be sure to tell all health care professionals you are taking this medicine. Certain medicines, like metronidazole and disulfiram, can cause an unpleasant reaction when taken with alcohol. The reaction includes flushing, headache, nausea, vomiting, sweating, and increased thirst. The reaction can last from 30 minutes to several hours. Do not become pregnant while taking this medicine or for 1 year after stopping it. Women should inform their health care professional if they wish to become pregnant or think they might be pregnant. Men should not father a child while taking this medicine and for 4 months after stopping it. There is potential for serious side effects to an unborn child. Talk to your health care professional for more information. Do not breast-feed an infant while taking this medicine or for 1 week after stopping it. This medicine has caused ovarian failure in some women. This medicine may make it more difficult to get pregnant. Talk to your health care professional if you are concerned about your fertility. This medicine has caused decreased sperm counts in some men. This may make it more difficult to father  a child. Talk to your health care professional if you are concerned about your fertility. Call your health care professional for advice if you get a fever, chills, or sore throat, or other symptoms of a cold or flu. Do not treat yourself. This medicine decreases your body's ability to fight infections. Try to avoid being around people who are sick. Avoid taking medicines that contain aspirin, acetaminophen, ibuprofen, naproxen, or ketoprofen unless instructed by your health care professional. These medicines may hide  a fever. Talk to your health care professional about your risk of cancer. You may be more at risk for certain types of cancer if you take this medicine. If you are going to need surgery or other procedure, tell your health care professional that you are using this medicine. Be careful brushing or flossing your teeth or using a toothpick because you may get an infection or bleed more easily. If you have any dental work done, tell your dentist you are receiving this medicine. What side effects may I notice from receiving this medicine? Side effects that you should report to your doctor or health care professional as soon as possible:  allergic reactions like skin rash, itching or hives, swelling of the face, lips, or tongue  breathing problems  nausea, vomiting  signs and symptoms of bleeding such as bloody or black, tarry stools; red or dark brown urine; spitting up blood or brown material that looks like coffee grounds; red spots on the skin; unusual bruising or bleeding from the eyes, gums, or nose  signs and symptoms of heart failure like fast, irregular heartbeat, sudden weight gain; swelling of the ankles, feet, hands  signs and symptoms of infection like fever; chills; cough; sore throat; pain or trouble passing urine  signs and symptoms of kidney injury like trouble passing urine or change in the amount of urine  signs and symptoms of liver injury like dark yellow or brown urine; general ill feeling or flu-like symptoms; light-colored stools; loss of appetite; nausea; right upper belly pain; unusually weak or tired; yellowing of the eyes or skin Side effects that usually do not require medical attention (report to your doctor or health care professional if they continue or are bothersome):  confusion  decreased hearing  diarrhea  facial flushing  hair loss  headache  loss of appetite  missed menstrual periods  signs and symptoms of low red blood cells or anemia such as  unusually weak or tired; feeling faint or lightheaded; falls  skin discoloration This list may not describe all possible side effects. Call your doctor for medical advice about side effects. You may report side effects to FDA at 1-800-FDA-1088. Where should I keep my medicine? This drug is given in a hospital or clinic and will not be stored at home. NOTE: This sheet is a summary. It may not cover all possible information. If you have questions about this medicine, talk to your doctor, pharmacist, or health care provider.  2021 Elsevier/Gold Standard (2018-12-26 09:53:29) Doxorubicin injection What is this medicine? DOXORUBICIN (dox oh ROO bi sin) is a chemotherapy drug. It is used to treat many kinds of cancer like leukemia, lymphoma, neuroblastoma, sarcoma, and Wilms' tumor. It is also used to treat bladder cancer, breast cancer, lung cancer, ovarian cancer, stomach cancer, and thyroid cancer. This medicine may be used for other purposes; ask your health care provider or pharmacist if you have questions. COMMON BRAND NAME(S): Adriamycin, Adriamycin PFS, Adriamycin RDF, Rubex What should I tell my health care  provider before I take this medicine? They need to know if you have any of these conditions:  heart disease  history of low blood counts caused by a medicine  liver disease  recent or ongoing radiation therapy  an unusual or allergic reaction to doxorubicin, other chemotherapy agents, other medicines, foods, dyes, or preservatives  pregnant or trying to get pregnant  breast-feeding How should I use this medicine? This drug is given as an infusion into a vein. It is administered in a hospital or clinic by a specially trained health care professional. If you have pain, swelling, burning or any unusual feeling around the site of your injection, tell your health care professional right away. Talk to your pediatrician regarding the use of this medicine in children. Special care may  be needed. Overdosage: If you think you have taken too much of this medicine contact a poison control center or emergency room at once. NOTE: This medicine is only for you. Do not share this medicine with others. What if I miss a dose? It is important not to miss your dose. Call your doctor or health care professional if you are unable to keep an appointment. What may interact with this medicine? This medicine may interact with the following medications:  6-mercaptopurine  paclitaxel  phenytoin  St. John's Wort  trastuzumab  verapamil This list may not describe all possible interactions. Give your health care provider a list of all the medicines, herbs, non-prescription drugs, or dietary supplements you use. Also tell them if you smoke, drink alcohol, or use illegal drugs. Some items may interact with your medicine. What should I watch for while using this medicine? This drug may make you feel generally unwell. This is not uncommon, as chemotherapy can affect healthy cells as well as cancer cells. Report any side effects. Continue your course of treatment even though you feel ill unless your doctor tells you to stop. There is a maximum amount of this medicine you should receive throughout your life. The amount depends on the medical condition being treated and your overall health. Your doctor will watch how much of this medicine you receive in your lifetime. Tell your doctor if you have taken this medicine before. You may need blood work done while you are taking this medicine. Your urine may turn red for a few days after your dose. This is not blood. If your urine is dark or brown, call your doctor. In some cases, you may be given additional medicines to help with side effects. Follow all directions for their use. Call your doctor or health care professional for advice if you get a fever, chills or sore throat, or other symptoms of a cold or flu. Do not treat yourself. This drug decreases  your body's ability to fight infections. Try to avoid being around people who are sick. This medicine may increase your risk to bruise or bleed. Call your doctor or health care professional if you notice any unusual bleeding. Talk to your doctor about your risk of cancer. You may be more at risk for certain types of cancers if you take this medicine. Do not become pregnant while taking this medicine or for 6 months after stopping it. Women should inform their doctor if they wish to become pregnant or think they might be pregnant. Men should not father a child while taking this medicine and for 6 months after stopping it. There is a potential for serious side effects to an unborn child. Talk to your health  care professional or pharmacist for more information. Do not breast-feed an infant while taking this medicine. This medicine has caused ovarian failure in some women and reduced sperm counts in some men This medicine may interfere with the ability to have a child. Talk with your doctor or health care professional if you are concerned about your fertility. This medicine may cause a decrease in Co-Enzyme Q-10. You should make sure that you get enough Co-Enzyme Q-10 while you are taking this medicine. Discuss the foods you eat and the vitamins you take with your health care professional. What side effects may I notice from receiving this medicine? Side effects that you should report to your doctor or health care professional as soon as possible:  allergic reactions like skin rash, itching or hives, swelling of the face, lips, or tongue  breathing problems  chest pain  fast or irregular heartbeat  low blood counts - this medicine may decrease the number of white blood cells, red blood cells and platelets. You may be at increased risk for infections and bleeding.  pain, redness, or irritation at site where injected  signs of infection - fever or chills, cough, sore throat, pain or difficulty passing  urine  signs of decreased platelets or bleeding - bruising, pinpoint red spots on the skin, black, tarry stools, blood in the urine  swelling of the ankles, feet, hands  tiredness  weakness Side effects that usually do not require medical attention (report to your doctor or health care professional if they continue or are bothersome):  diarrhea  hair loss  mouth sores  nail discoloration or damage  nausea  red colored urine  vomiting This list may not describe all possible side effects. Call your doctor for medical advice about side effects. You may report side effects to FDA at 1-800-FDA-1088. Where should I keep my medicine? This drug is given in a hospital or clinic and will not be stored at home. NOTE: This sheet is a summary. It may not cover all possible information. If you have questions about this medicine, talk to your doctor, pharmacist, or health care provider.  2021 Elsevier/Gold Standard (2016-11-04 11:01:26) Pegfilgrastim injection What is this medicine? PEGFILGRASTIM (PEG fil gra stim) is a long-acting granulocyte colony-stimulating factor that stimulates the growth of neutrophils, a type of white blood cell important in the body's fight against infection. It is used to reduce the incidence of fever and infection in patients with certain types of cancer who are receiving chemotherapy that affects the bone marrow, and to increase survival after being exposed to high doses of radiation. This medicine may be used for other purposes; ask your health care provider or pharmacist if you have questions. COMMON BRAND NAME(S): Rexene Edison, Ziextenzo What should I tell my health care provider before I take this medicine? They need to know if you have any of these conditions:  kidney disease  latex allergy  ongoing radiation therapy  sickle cell disease  skin reactions to acrylic adhesives (On-Body Injector only)  an unusual or allergic  reaction to pegfilgrastim, filgrastim, other medicines, foods, dyes, or preservatives  pregnant or trying to get pregnant  breast-feeding How should I use this medicine? This medicine is for injection under the skin. If you get this medicine at home, you will be taught how to prepare and give the pre-filled syringe or how to use the On-body Injector. Refer to the patient Instructions for Use for detailed instructions. Use exactly as directed. Tell your healthcare  provider immediately if you suspect that the On-body Injector may not have performed as intended or if you suspect the use of the On-body Injector resulted in a missed or partial dose. It is important that you put your used needles and syringes in a special sharps container. Do not put them in a trash can. If you do not have a sharps container, call your pharmacist or healthcare provider to get one. Talk to your pediatrician regarding the use of this medicine in children. While this drug may be prescribed for selected conditions, precautions do apply. Overdosage: If you think you have taken too much of this medicine contact a poison control center or emergency room at once. NOTE: This medicine is only for you. Do not share this medicine with others. What if I miss a dose? It is important not to miss your dose. Call your doctor or health care professional if you miss your dose. If you miss a dose due to an On-body Injector failure or leakage, a new dose should be administered as soon as possible using a single prefilled syringe for manual use. What may interact with this medicine? Interactions have not been studied. This list may not describe all possible interactions. Give your health care provider a list of all the medicines, herbs, non-prescription drugs, or dietary supplements you use. Also tell them if you smoke, drink alcohol, or use illegal drugs. Some items may interact with your medicine. What should I watch for while using this  medicine? Your condition will be monitored carefully while you are receiving this medicine. You may need blood work done while you are taking this medicine. Talk to your health care provider about your risk of cancer. You may be more at risk for certain types of cancer if you take this medicine. If you are going to need a MRI, CT scan, or other procedure, tell your doctor that you are using this medicine (On-Body Injector only). What side effects may I notice from receiving this medicine? Side effects that you should report to your doctor or health care professional as soon as possible:  allergic reactions (skin rash, itching or hives, swelling of the face, lips, or tongue)  back pain  dizziness  fever  pain, redness, or irritation at site where injected  pinpoint red spots on the skin  red or dark-brown urine  shortness of breath or breathing problems  stomach or side pain, or pain at the shoulder  swelling  tiredness  trouble passing urine or change in the amount of urine  unusual bruising or bleeding Side effects that usually do not require medical attention (report to your doctor or health care professional if they continue or are bothersome):  bone pain  muscle pain This list may not describe all possible side effects. Call your doctor for medical advice about side effects. You may report side effects to FDA at 1-800-FDA-1088. Where should I keep my medicine? Keep out of the reach of children. If you are using this medicine at home, you will be instructed on how to store it. Throw away any unused medicine after the expiration date on the label. NOTE: This sheet is a summary. It may not cover all possible information. If you have questions about this medicine, talk to your doctor, pharmacist, or health care provider.  2021 Elsevier/Gold Standard (2019-04-14 13:20:51)

## 2020-07-31 ENCOUNTER — Encounter: Payer: Self-pay | Admitting: Licensed Clinical Social Worker

## 2020-07-31 ENCOUNTER — Ambulatory Visit: Payer: Self-pay | Admitting: Licensed Clinical Social Worker

## 2020-07-31 ENCOUNTER — Telehealth: Payer: Self-pay | Admitting: Licensed Clinical Social Worker

## 2020-07-31 DIAGNOSIS — Z808 Family history of malignant neoplasm of other organs or systems: Secondary | ICD-10-CM

## 2020-07-31 DIAGNOSIS — C50512 Malignant neoplasm of lower-outer quadrant of left female breast: Secondary | ICD-10-CM

## 2020-07-31 DIAGNOSIS — Z1379 Encounter for other screening for genetic and chromosomal anomalies: Secondary | ICD-10-CM | POA: Insufficient documentation

## 2020-07-31 DIAGNOSIS — Z801 Family history of malignant neoplasm of trachea, bronchus and lung: Secondary | ICD-10-CM

## 2020-07-31 NOTE — Telephone Encounter (Signed)
Revealed negative genetic testing.  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  It is unlikely that there is an increased risk of another cancer due to a mutation in one of these genes.  However, genetic testing is not perfect, and cannot definitively rule out a hereditary cause.  It will be important for her to keep in contact with genetics to learn if any additional testing may be needed in the future.

## 2020-07-31 NOTE — Progress Notes (Signed)
HPI:  Anna Stewart was previously seen in the [place] clinic due to a personal and family history of cancer and concerns regarding a hereditary predisposition to cancer. Please refer to our prior cancer genetics clinic note for more information regarding our discussion, assessment and recommendations, at the time. Anna Stewart recent genetic test results were disclosed to her, as were recommendations warranted by these results. These results and recommendations are discussed in more detail below.  CANCER HISTORY:  Oncology History  Malignant neoplasm of lower-outer quadrant of left breast of female, estrogen receptor negative ([place])  06/19/2020 Initial Diagnosis   Malignant neoplasm of lower-outer quadrant of left breast of female, estrogen receptor negative ([place])   06/19/2020 Cancer Staging   Staging form: Breast, AJCC 8th Edition - Clinical stage from 06/19/2020: Stage IB (cT1b, cN0, cM0, G3, ER-, PR-, HER2-) - Signed by Sindy Guadeloupe, MD on 06/19/2020 Stage prefix: Initial diagnosis Histologic grading system: 3 grade system   07/04/2020 Cancer Staging   Staging form: Breast, AJCC 8th Edition - Pathologic stage from 07/04/2020: Stage IB (pT1c, pN0, cM0, G2, ER-, PR-, HER2-) - Signed by Sindy Guadeloupe, MD on 07/07/2020 Histologic grading system: 3 grade system   07/15/2020 -  Chemotherapy    Patient is on Treatment Plan: BREAST ADJUVANT DOSE DENSE AC Q14D / PACLITAXEL Q7D       Genetic Testing   Negative genetic testing. No pathogenic variants identified on the [facility] Multi-Cancer Panel +RNA. The report date is 07/31/2020.  The Multi-Cancer Panel + RNA offered by [facility] includes sequencing and/or deletion duplication testing of the following 84 genes: AIP, ALK, APC, ATM, AXIN2,BAP1,  BARD1, BLM, BMPR1A, BRCA1, BRCA2, BRIP1, CASR, CDC73, CDH1, CDK4, CDKN1B, CDKN1C, CDKN2A (p14ARF), CDKN2A (p16INK4a), CEBPA, CHEK2, CTNNA1, DICER1, DIS3L2, EGFR (c.2369C>T, p.Thr790Met variant  only), EPCAM (Deletion/duplication testing only), FH, FLCN, GATA2, GPC3, GREM1 (Promoter region deletion/duplication testing only), HOXB13 (c.251G>A, p.Gly84Glu), HRAS, KIT, MAX, MEN1, MET, MITF (c.952G>A, p.Glu318Lys variant only), MLH1, MSH2, MSH3, MSH6, MUTYH, NBN, NF1, NF2, NTHL1, PALB2, PDGFRA, PHOX2B, PMS2, POLD1, POLE, POT1, PRKAR1A, PTCH1, PTEN, RAD50, RAD51C, RAD51D, RB1, RECQL4, RET, RUNX1, SDHAF2, SDHA (sequence changes only), SDHB, SDHC, SDHD, SMAD4, SMARCA4, SMARCB1, SMARCE1, STK11, SUFU, TERC, TERT, TMEM127, TP53, TSC1, TSC2, VHL, WRN and WT1.     FAMILY HISTORY:  We obtained a detailed, 4-generation family history.  Significant diagnoses are listed below: Family History  Problem Relation Age of Onset  . Brain cancer Mother 45       tumor  . Diabetes Father   . Heart disease Father   . Lung cancer Father   . Cancer Paternal Aunt   . Diabetes Maternal Grandmother   . Stroke Maternal Grandfather   . Breast cancer Neg Hx     Anna Stewart has a son and 2 daughters, no cancers. She has 2 brothers, no cancers.  Anna Stewart mother had brain tumor at 29 and passed within weeks of her diagnosis. Patient had 7 maternal uncles. Limited information about this side of the family, an uncle did have leukemia, no other known cancers.  Anna Stewart father had lung cancer and died of diabetes at 11. Patient had 1 paternal aunt who had cancer, unknown type. No other known cancers on this side of the family.  Anna Stewart is unaware of previous family history of genetic testing for hereditary cancer risks. Patient's maternal ancestors are of [place] descent, and paternal ancestors are of [place] descent. There is no reported Ashkenazi Jewish ancestry. There  is no known consanguinity.    GENETIC TEST RESULTS: Genetic testing reported out on 07/31/2020 through the [facility] Multi-Cancer Panel+RNA cancer panel found no pathogenic mutations.   The Multi-Cancer Panel + RNA offered by  [facility] includes sequencing and/or deletion duplication testing of the following 84 genes: AIP, ALK, APC, ATM, AXIN2,BAP1,  BARD1, BLM, BMPR1A, BRCA1, BRCA2, BRIP1, CASR, CDC73, CDH1, CDK4, CDKN1B, CDKN1C, CDKN2A (p14ARF), CDKN2A (p16INK4a), CEBPA, CHEK2, CTNNA1, DICER1, DIS3L2, EGFR (c.2369C>T, p.Thr790Met variant only), EPCAM (Deletion/duplication testing only), FH, FLCN, GATA2, GPC3, GREM1 (Promoter region deletion/duplication testing only), HOXB13 (c.251G>A, p.Gly84Glu), HRAS, KIT, MAX, MEN1, MET, MITF (c.952G>A, p.Glu318Lys variant only), MLH1, MSH2, MSH3, MSH6, MUTYH, NBN, NF1, NF2, NTHL1, PALB2, PDGFRA, PHOX2B, PMS2, POLD1, POLE, POT1, PRKAR1A, PTCH1, PTEN, RAD50, RAD51C, RAD51D, RB1, RECQL4, RET, RUNX1, SDHAF2, SDHA (sequence changes only), SDHB, SDHC, SDHD, SMAD4, SMARCA4, SMARCB1, SMARCE1, STK11, SUFU, TERC, TERT, TMEM127, TP53, TSC1, TSC2, VHL, WRN and WT1.   The test report has been scanned into EPIC and is located under the Molecular Pathology section of the Results Review tab.  A portion of the result report is included below for reference.     We discussed with Anna Stewart that because current genetic testing is not perfect, it is possible there may be a gene mutation in one of these genes that current testing cannot detect, but that chance is small.  We also discussed, that there could be another gene that has not yet been discovered, or that we have not yet tested, that is responsible for the cancer diagnoses in the family. It is also possible there is a hereditary cause for the cancer in the family that Anna Stewart did not inherit and therefore was not identified in her testing.  Therefore, it is important to remain in touch with cancer genetics in the future so that we can continue to offer Anna Stewart the most up to date genetic testing.   ADDITIONAL GENETIC TESTING: We discussed with Anna Stewart that her genetic testing was fairly extensive.  If there are genes identified to  increase cancer risk that can be analyzed in the future, we would be happy to discuss and coordinate this testing at that time.    CANCER SCREENING RECOMMENDATIONS: Anna Stewart test result is considered negative (normal).  This means that we have not identified a hereditary cause for her  personal and family history of cancer at this time. Most cancers happen by chance and this negative test suggests that her cancer may fall into this category.    While reassuring, this does not definitively rule out a hereditary predisposition to cancer. It is still possible that there could be genetic mutations that are undetectable by current technology. There could be genetic mutations in genes that have not been tested or identified to increase cancer risk.  Therefore, it is recommended she continue to follow the cancer management and screening guidelines provided by her oncology and primary healthcare provider.   An individual's cancer risk and medical management are not determined by genetic test results alone. Overall cancer risk assessment incorporates additional factors, including personal medical history, family history, and any available genetic information that may result in a personalized plan for cancer prevention and surveillance.  RECOMMENDATIONS FOR FAMILY MEMBERS:  Relatives in this family might be at some increased risk of developing cancer, over the general population risk, simply due to the family history of cancer.  We recommended female relatives in this family have a yearly mammogram beginning at age 16, or 19  years younger than the earliest onset of cancer, an annual clinical breast exam, and perform monthly breast self-exams. Female relatives in this family should also have a gynecological exam as recommended by their primary provider.  All family members should be referred for colonoscopy starting at age 35.   FOLLOW-UP: Lastly, we discussed with Ms. Wulf that cancer genetics is a  rapidly advancing field and it is possible that new genetic tests will be appropriate for her and/or her family members in the future. We encouraged her to remain in contact with cancer genetics on an annual basis so we can update her personal and family histories and let her know of advances in cancer genetics that may benefit this family.   Our contact number was provided. Ms. Czaplicki questions were answered to her satisfaction, and she knows she is welcome to call us at anytime with additional questions or concerns.   Anna Rogue, MS, [facility] Genetic Counselor [email or web address] Phone: [phone]

## 2020-08-01 ENCOUNTER — Encounter: Payer: Self-pay | Admitting: Oncology

## 2020-08-06 ENCOUNTER — Telehealth: Payer: Self-pay | Admitting: *Deleted

## 2020-08-06 NOTE — Telephone Encounter (Signed)
Evushield was just a week or 2 ago. Its best she gets tested even if she is asymptomatic before she comes for next chemo. If she is symptomatic and positive call us. Otherwise just monitor.

## 2020-08-06 NOTE — Telephone Encounter (Signed)
I called the pt. And one of her children came home for weekend. And it was possible covid exposure and the kid is gone. She is feeling good, no sx at all. She says if she gets sx. Then she will call and let us know. She asked about where to get covid test and I told her that PCP at [facility] could test her. She will let us know if she needs to be tested but for now she has no symptoms. She states that she feels great this week.

## 2020-08-06 NOTE — Telephone Encounter (Signed)
If she is asymptomatic. Just monitor for any symptoms. She can get tested to see if she is truly positive.  Anna Stewart- she was already given evushield. Would she qualify for any other antibody Rx?

## 2020-08-06 NOTE — Telephone Encounter (Signed)
Agree with Dr. Janese Banks. Follow cdc guidance regarding exposure at this point. Test if she becomes symptomatic. Not sure when she got evusheld but plan to repeat if > 6 months. If she becomes symptomatic and positive then could screen her for antibodies but I suspect she'd be a good candidate for antivirals (paxlovid).

## 2020-08-06 NOTE — Telephone Encounter (Signed)
Patient called reporting that she has been exposed to [place] and is asking what she needs to do

## 2020-08-12 ENCOUNTER — Encounter: Payer: Self-pay | Admitting: Oncology

## 2020-08-12 ENCOUNTER — Inpatient Hospital Stay: Payer: 59 | Attending: Oncology

## 2020-08-12 ENCOUNTER — Inpatient Hospital Stay (HOSPITAL_BASED_OUTPATIENT_CLINIC_OR_DEPARTMENT_OTHER): Payer: 59 | Admitting: Oncology

## 2020-08-12 ENCOUNTER — Other Ambulatory Visit: Payer: Self-pay

## 2020-08-12 ENCOUNTER — Inpatient Hospital Stay: Payer: 59

## 2020-08-12 VITALS — BP 123/81 | HR 99 | Temp 96.4°F | Resp 18 | Wt 187.0 lb

## 2020-08-12 DIAGNOSIS — C50512 Malignant neoplasm of lower-outer quadrant of left female breast: Secondary | ICD-10-CM | POA: Diagnosis not present

## 2020-08-12 DIAGNOSIS — R5383 Other fatigue: Secondary | ICD-10-CM | POA: Diagnosis not present

## 2020-08-12 DIAGNOSIS — R5381 Other malaise: Secondary | ICD-10-CM | POA: Insufficient documentation

## 2020-08-12 DIAGNOSIS — G4709 Other insomnia: Secondary | ICD-10-CM | POA: Diagnosis not present

## 2020-08-12 DIAGNOSIS — D6481 Anemia due to antineoplastic chemotherapy: Secondary | ICD-10-CM | POA: Insufficient documentation

## 2020-08-12 DIAGNOSIS — Z5189 Encounter for other specified aftercare: Secondary | ICD-10-CM | POA: Insufficient documentation

## 2020-08-12 DIAGNOSIS — Z801 Family history of malignant neoplasm of trachea, bronchus and lung: Secondary | ICD-10-CM | POA: Insufficient documentation

## 2020-08-12 DIAGNOSIS — Z5111 Encounter for antineoplastic chemotherapy: Secondary | ICD-10-CM | POA: Insufficient documentation

## 2020-08-12 DIAGNOSIS — Z171 Estrogen receptor negative status [ER-]: Secondary | ICD-10-CM

## 2020-08-12 DIAGNOSIS — Z79899 Other long term (current) drug therapy: Secondary | ICD-10-CM | POA: Insufficient documentation

## 2020-08-12 DIAGNOSIS — T451X5A Adverse effect of antineoplastic and immunosuppressive drugs, initial encounter: Secondary | ICD-10-CM | POA: Diagnosis not present

## 2020-08-12 DIAGNOSIS — Z7952 Long term (current) use of systemic steroids: Secondary | ICD-10-CM | POA: Diagnosis not present

## 2020-08-12 LAB — CBC WITH DIFFERENTIAL/PLATELET
Abs Immature Granulocytes: 0.27 10*3/uL — ABNORMAL HIGH (ref 0.00–0.07)
Basophils Absolute: 0.1 10*3/uL (ref 0.0–0.1)
Basophils Relative: 1 %
Eosinophils Absolute: 0 10*3/uL (ref 0.0–0.5)
Eosinophils Relative: 0 %
HCT: 36.4 % (ref 36.0–46.0)
Hemoglobin: 12.3 g/dL (ref 12.0–15.0)
Immature Granulocytes: 5 %
Lymphocytes Relative: 15 %
Lymphs Abs: 0.9 10*3/uL (ref 0.7–4.0)
MCH: 30.8 pg (ref 26.0–34.0)
MCHC: 33.8 g/dL (ref 30.0–36.0)
MCV: 91.2 fL (ref 80.0–100.0)
Monocytes Absolute: 1 10*3/uL (ref 0.1–1.0)
Monocytes Relative: 16 %
Neutro Abs: 3.8 10*3/uL (ref 1.7–7.7)
Neutrophils Relative %: 63 %
Platelets: 242 10*3/uL (ref 150–400)
RBC: 3.99 MIL/uL (ref 3.87–5.11)
RDW: 14.6 % (ref 11.5–15.5)
WBC: 6 10*3/uL (ref 4.0–10.5)
nRBC: 0.3 % — ABNORMAL HIGH (ref 0.0–0.2)

## 2020-08-12 LAB — COMPREHENSIVE METABOLIC PANEL
ALT: 24 U/L (ref 0–44)
AST: 21 U/L (ref 15–41)
Albumin: 4.3 g/dL (ref 3.5–5.0)
Alkaline Phosphatase: 75 U/L (ref 38–126)
Anion gap: 11 (ref 5–15)
BUN: 16 mg/dL (ref 8–23)
CO2: 24 mmol/L (ref 22–32)
Calcium: 9.2 mg/dL (ref 8.9–10.3)
Chloride: 103 mmol/L (ref 98–111)
Creatinine, Ser: 0.7 mg/dL (ref 0.44–1.00)
GFR, Estimated: >60 mL/min (ref >60)
Glucose, Bld: 108 mg/dL — ABNORMAL HIGH (ref 70–99)
Potassium: 4.1 mmol/L (ref 3.5–5.1)
Sodium: 138 mmol/L (ref 135–145)
Total Bilirubin: 0.5 mg/dL (ref 0.3–1.2)
Total Protein: 7 g/dL (ref 6.5–8.1)

## 2020-08-12 MED ORDER — HEPARIN SOD (PORK) LOCK FLUSH 100 UNIT/ML IV SOLN
INTRAVENOUS | Status: AC
  Filled 2020-08-12: qty 5

## 2020-08-12 MED ORDER — SODIUM CHLORIDE 0.9 % IV SOLN
10.0000 mg | Freq: Once | INTRAVENOUS | Status: AC
  Administered 2020-08-12: 10 mg via INTRAVENOUS
  Filled 2020-08-12: qty 10

## 2020-08-12 MED ORDER — SODIUM CHLORIDE 0.9 % IV SOLN
600.0000 mg/m2 | Freq: Once | INTRAVENOUS | Status: AC
  Administered 2020-08-12: 1200 mg via INTRAVENOUS
  Filled 2020-08-12: qty 50

## 2020-08-12 MED ORDER — SODIUM CHLORIDE 0.9% FLUSH
10.0000 mL | INTRAVENOUS | Status: DC | PRN
  Filled 2020-08-12: qty 10

## 2020-08-12 MED ORDER — TRAZODONE HCL 50 MG PO TABS: 50.0000 mg | ORAL_TABLET | Freq: Every day | ORAL | 1 refills | Status: DC

## 2020-08-12 MED ORDER — PALONOSETRON HCL INJECTION 0.25 MG/5ML
0.2500 mg | Freq: Once | INTRAVENOUS | Status: AC
  Administered 2020-08-12: 0.25 mg via INTRAVENOUS
  Filled 2020-08-12: qty 5

## 2020-08-12 MED ORDER — PEGFILGRASTIM 6 MG/0.6ML ~~LOC~~ PSKT
6.0000 mg | PREFILLED_SYRINGE | Freq: Once | SUBCUTANEOUS | Status: AC
  Administered 2020-08-12: 6 mg via SUBCUTANEOUS
  Filled 2020-08-12: qty 0.6

## 2020-08-12 MED ORDER — DOXORUBICIN HCL CHEMO IV INJECTION 2 MG/ML
60.0000 mg/m2 | Freq: Once | INTRAVENOUS | Status: AC
  Administered 2020-08-12: 120 mg via INTRAVENOUS
  Filled 2020-08-12: qty 50

## 2020-08-12 MED ORDER — SODIUM CHLORIDE 0.9 % IV SOLN
150.0000 mg | Freq: Once | INTRAVENOUS | Status: AC
  Administered 2020-08-12: 150 mg via INTRAVENOUS
  Filled 2020-08-12: qty 150

## 2020-08-12 MED ORDER — HEPARIN SOD (PORK) LOCK FLUSH 100 UNIT/ML IV SOLN
500.0000 [IU] | Freq: Once | INTRAVENOUS | Status: AC | PRN
  Administered 2020-08-12: 500 [IU]
  Filled 2020-08-12: qty 5

## 2020-08-12 MED ORDER — SODIUM CHLORIDE 0.9 % IV SOLN
Freq: Once | INTRAVENOUS | Status: AC
  Filled 2020-08-12: qty 250

## 2020-08-12 NOTE — Progress Notes (Signed)
Patient here for oncology follow-up appointment, concerns of headaches and sleep aid options

## 2020-08-12 NOTE — Progress Notes (Signed)
Pt tolerated all infusions well today and left the chemo suite stable and ambulatory with her neulasta on pro in place.

## 2020-08-12 NOTE — Patient Instructions (Signed)
[facility]  Discharge Instructions: Thank you for choosing [facility] to provide your oncology and hematology care.  If you have a lab appointment with the [facility], please go directly to the [facility] and check in at the registration area.  Wear comfortable clothing and clothing appropriate for easy access to any Portacath or PICC line.   We strive to give you quality time with your provider. You may need to reschedule your appointment if you arrive late (15 or more minutes).  Arriving late affects you and other patients whose appointments are after yours.  Also, if you miss three or more appointments without notifying the office, you may be dismissed from the clinic at the provider's discretion.      For prescription refill requests, have your pharmacy contact our office and allow 72 hours for refills to be completed.    Today you received the following chemotherapy and/or immunotherapy agents adriamycin, cyclophophamide, neulasta on pro      To help prevent nausea and vomiting after your treatment, we encourage you to take your nausea medication as directed.  BELOW ARE SYMPTOMS THAT SHOULD BE REPORTED IMMEDIATELY: . *FEVER GREATER THAN 100.4 F (38 C) OR HIGHER . *CHILLS OR SWEATING . *NAUSEA AND VOMITING THAT IS NOT CONTROLLED WITH YOUR NAUSEA MEDICATION . *UNUSUAL SHORTNESS OF BREATH . *UNUSUAL BRUISING OR BLEEDING . *URINARY PROBLEMS (pain or burning when urinating, or frequent urination) . *BOWEL PROBLEMS (unusual diarrhea, constipation, pain near the anus) . TENDERNESS IN MOUTH AND THROAT WITH OR WITHOUT PRESENCE OF ULCERS (sore throat, sores in mouth, or a toothache) . UNUSUAL RASH, SWELLING OR PAIN  . UNUSUAL VAGINAL DISCHARGE OR ITCHING   Items with * indicate a potential emergency and should be followed up as soon as possible or go to the Emergency Department if any problems should occur.  Please show the  CHEMOTHERAPY ALERT CARD or IMMUNOTHERAPY ALERT CARD at check-in to the Emergency Department and triage nurse.  Should you have questions after your visit or need to cancel or reschedule your appointment, please contact [facility]  [phone] and follow the prompts.  Office hours are 8:00 a.m. to 4:30 p.m. Monday - Friday. Please note that voicemails left after 4:00 p.m. may not be returned until the following business day.  We are closed weekends and major holidays. You have access to a nurse at all times for urgent questions. Please call the main number to the clinic [phone] and follow the prompts.  For any non-urgent questions, you may also contact your provider using MyChart. We now offer e-Visits for anyone 53 and older to request care online for non-urgent symptoms. For details visit [email or web address].   Also download the MyChart app! Go to the app store, search "MyChart", open the app, select [facility], and log in with your MyChart username and password.  Due to Covid, a mask is required upon entering the hospital/clinic. If you do not have a mask, one will be given to you upon arrival. For doctor visits, patients may have 1 support person aged 90 or older with them. For treatment visits, patients cannot have anyone with them due to current Covid guidelines and our immunocompromised population.   Doxorubicin injection What is this medicine? DOXORUBICIN (dox oh ROO bi sin) is a chemotherapy drug. It is used to treat many kinds of cancer like leukemia, lymphoma, neuroblastoma, sarcoma, and Wilms' tumor. It is also used to treat bladder cancer,  breast cancer, lung cancer, ovarian cancer, stomach cancer, and thyroid cancer. This medicine may be used for other purposes; ask your health care provider or pharmacist if you have questions. COMMON BRAND NAME(S): Adriamycin, Adriamycin PFS, Adriamycin RDF, Rubex What should I tell my health care provider  before I take this medicine? They need to know if you have any of these conditions:  heart disease  history of low blood counts caused by a medicine  liver disease  recent or ongoing radiation therapy  an unusual or allergic reaction to doxorubicin, other chemotherapy agents, other medicines, foods, dyes, or preservatives  pregnant or trying to get pregnant  breast-feeding How should I use this medicine? This drug is given as an infusion into a vein. It is administered in a hospital or clinic by a specially trained health care professional. If you have pain, swelling, burning or any unusual feeling around the site of your injection, tell your health care professional right away. Talk to your pediatrician regarding the use of this medicine in children. Special care may be needed. Overdosage: If you think you have taken too much of this medicine contact a poison control center or emergency room at once. NOTE: This medicine is only for you. Do not share this medicine with others. What if I miss a dose? It is important not to miss your dose. Call your doctor or health care professional if you are unable to keep an appointment. What may interact with this medicine? This medicine may interact with the following medications:  6-mercaptopurine  paclitaxel  phenytoin  St. John's Wort  trastuzumab  verapamil This list may not describe all possible interactions. Give your health care provider a list of all the medicines, herbs, non-prescription drugs, or dietary supplements you use. Also tell them if you smoke, drink alcohol, or use illegal drugs. Some items may interact with your medicine. What should I watch for while using this medicine? This drug may make you feel generally unwell. This is not uncommon, as chemotherapy can affect healthy cells as well as cancer cells. Report any side effects. Continue your course of treatment even though you feel ill unless your doctor tells you to  stop. There is a maximum amount of this medicine you should receive throughout your life. The amount depends on the medical condition being treated and your overall health. Your doctor will watch how much of this medicine you receive in your lifetime. Tell your doctor if you have taken this medicine before. You may need blood work done while you are taking this medicine. Your urine may turn red for a few days after your dose. This is not blood. If your urine is dark or brown, call your doctor. In some cases, you may be given additional medicines to help with side effects. Follow all directions for their use. Call your doctor or health care professional for advice if you get a fever, chills or sore throat, or other symptoms of a cold or flu. Do not treat yourself. This drug decreases your body's ability to fight infections. Try to avoid being around people who are sick. This medicine may increase your risk to bruise or bleed. Call your doctor or health care professional if you notice any unusual bleeding. Talk to your doctor about your risk of cancer. You may be more at risk for certain types of cancers if you take this medicine. Do not become pregnant while taking this medicine or for 6 months after stopping it. Women should inform their  doctor if they wish to become pregnant or think they might be pregnant. Men should not father a child while taking this medicine and for 6 months after stopping it. There is a potential for serious side effects to an unborn child. Talk to your health care professional or pharmacist for more information. Do not breast-feed an infant while taking this medicine. This medicine has caused ovarian failure in some women and reduced sperm counts in some men This medicine may interfere with the ability to have a child. Talk with your doctor or health care professional if you are concerned about your fertility. This medicine may cause a decrease in Co-Enzyme Q-10. You should make  sure that you get enough Co-Enzyme Q-10 while you are taking this medicine. Discuss the foods you eat and the vitamins you take with your health care professional. What side effects may I notice from receiving this medicine? Side effects that you should report to your doctor or health care professional as soon as possible:  allergic reactions like skin rash, itching or hives, swelling of the face, lips, or tongue  breathing problems  chest pain  fast or irregular heartbeat  low blood counts - this medicine may decrease the number of white blood cells, red blood cells and platelets. You may be at increased risk for infections and bleeding.  pain, redness, or irritation at site where injected  signs of infection - fever or chills, cough, sore throat, pain or difficulty passing urine  signs of decreased platelets or bleeding - bruising, pinpoint red spots on the skin, black, tarry stools, blood in the urine  swelling of the ankles, feet, hands  tiredness  weakness Side effects that usually do not require medical attention (report to your doctor or health care professional if they continue or are bothersome):  diarrhea  hair loss  mouth sores  nail discoloration or damage  nausea  red colored urine  vomiting This list may not describe all possible side effects. Call your doctor for medical advice about side effects. You may report side effects to FDA at 1-800-FDA-1088. Where should I keep my medicine? This drug is given in a hospital or clinic and will not be stored at home. NOTE: This sheet is a summary. It may not cover all possible information. If you have questions about this medicine, talk to your doctor, pharmacist, or health care provider.  2021 Elsevier/Gold Standard (2016-11-04 11:01:26)  Cyclophosphamide Injection What is this medicine? CYCLOPHOSPHAMIDE (sye kloe FOSS fa mide) is a chemotherapy drug. It slows the growth of cancer cells. This medicine is used to  treat many types of cancer like lymphoma, myeloma, leukemia, breast cancer, and ovarian cancer, to name a few. This medicine may be used for other purposes; ask your health care provider or pharmacist if you have questions. COMMON BRAND NAME(S): Cytoxan, Neosar What should I tell my health care provider before I take this medicine? They need to know if you have any of these conditions:  heart disease  history of irregular heartbeat  infection  kidney disease  liver disease  low blood counts, like white cells, platelets, or red blood cells  on hemodialysis  recent or ongoing radiation therapy  scarring or thickening of the lungs  trouble passing urine  an unusual or allergic reaction to cyclophosphamide, other medicines, foods, dyes, or preservatives  pregnant or trying to get pregnant  breast-feeding How should I use this medicine? This drug is usually given as an injection into a vein or  muscle or by infusion into a vein. It is administered in a hospital or clinic by a specially trained health care professional. Talk to your pediatrician regarding the use of this medicine in children. Special care may be needed. Overdosage: If you think you have taken too much of this medicine contact a poison control center or emergency room at once. NOTE: This medicine is only for you. Do not share this medicine with others. What if I miss a dose? It is important not to miss your dose. Call your doctor or health care professional if you are unable to keep an appointment. What may interact with this medicine?  amphotericin B  azathioprine  certain antivirals for HIV or hepatitis  certain medicines for blood pressure, heart disease, irregular heart beat  certain medicines that treat or prevent blood clots like warfarin  certain other medicines for cancer  cyclosporine  etanercept  indomethacin  medicines that relax muscles for surgery  medicines to increase blood  counts  metronidazole This list may not describe all possible interactions. Give your health care provider a list of all the medicines, herbs, non-prescription drugs, or dietary supplements you use. Also tell them if you smoke, drink alcohol, or use illegal drugs. Some items may interact with your medicine. What should I watch for while using this medicine? Your condition will be monitored carefully while you are receiving this medicine. You may need blood work done while you are taking this medicine. Drink water or other fluids as directed. Urinate often, even at night. Some products may contain alcohol. Ask your health care professional if this medicine contains alcohol. Be sure to tell all health care professionals you are taking this medicine. Certain medicines, like metronidazole and disulfiram, can cause an unpleasant reaction when taken with alcohol. The reaction includes flushing, headache, nausea, vomiting, sweating, and increased thirst. The reaction can last from 30 minutes to several hours. Do not become pregnant while taking this medicine or for 1 year after stopping it. Women should inform their health care professional if they wish to become pregnant or think they might be pregnant. Men should not father a child while taking this medicine and for 4 months after stopping it. There is potential for serious side effects to an unborn child. Talk to your health care professional for more information. Do not breast-feed an infant while taking this medicine or for 1 week after stopping it. This medicine has caused ovarian failure in some women. This medicine may make it more difficult to get pregnant. Talk to your health care professional if you are concerned about your fertility. This medicine has caused decreased sperm counts in some men. This may make it more difficult to father a child. Talk to your health care professional if you are concerned about your fertility. Call your health care  professional for advice if you get a fever, chills, or sore throat, or other symptoms of a cold or flu. Do not treat yourself. This medicine decreases your body's ability to fight infections. Try to avoid being around people who are sick. Avoid taking medicines that contain aspirin, acetaminophen, ibuprofen, naproxen, or ketoprofen unless instructed by your health care professional. These medicines may hide a fever. Talk to your health care professional about your risk of cancer. You may be more at risk for certain types of cancer if you take this medicine. If you are going to need surgery or other procedure, tell your health care professional that you are using this medicine. Be careful  brushing or flossing your teeth or using a toothpick because you may get an infection or bleed more easily. If you have any dental work done, tell your dentist you are receiving this medicine. What side effects may I notice from receiving this medicine? Side effects that you should report to your doctor or health care professional as soon as possible:  allergic reactions like skin rash, itching or hives, swelling of the face, lips, or tongue  breathing problems  nausea, vomiting  signs and symptoms of bleeding such as bloody or black, tarry stools; red or dark brown urine; spitting up blood or brown material that looks like coffee grounds; red spots on the skin; unusual bruising or bleeding from the eyes, gums, or nose  signs and symptoms of heart failure like fast, irregular heartbeat, sudden weight gain; swelling of the ankles, feet, hands  signs and symptoms of infection like fever; chills; cough; sore throat; pain or trouble passing urine  signs and symptoms of kidney injury like trouble passing urine or change in the amount of urine  signs and symptoms of liver injury like dark yellow or brown urine; general ill feeling or flu-like symptoms; light-colored stools; loss of appetite; nausea; right upper belly  pain; unusually weak or tired; yellowing of the eyes or skin Side effects that usually do not require medical attention (report to your doctor or health care professional if they continue or are bothersome):  confusion  decreased hearing  diarrhea  facial flushing  hair loss  headache  loss of appetite  missed menstrual periods  signs and symptoms of low red blood cells or anemia such as unusually weak or tired; feeling faint or lightheaded; falls  skin discoloration This list may not describe all possible side effects. Call your doctor for medical advice about side effects. You may report side effects to FDA at 1-800-FDA-1088. Where should I keep my medicine? This drug is given in a hospital or clinic and will not be stored at home. NOTE: This sheet is a summary. It may not cover all possible information. If you have questions about this medicine, talk to your doctor, pharmacist, or health care provider.  2021 Elsevier/Gold Standard (2018-12-26 09:53:29)  Pegfilgrastim injection What is this medicine? PEGFILGRASTIM (PEG fil gra stim) is a long-acting granulocyte colony-stimulating factor that stimulates the growth of neutrophils, a type of white blood cell important in the body's fight against infection. It is used to reduce the incidence of fever and infection in patients with certain types of cancer who are receiving chemotherapy that affects the bone marrow, and to increase survival after being exposed to high doses of radiation. This medicine may be used for other purposes; ask your health care provider or pharmacist if you have questions. COMMON BRAND NAME(S): Rexene Edison, Ziextenzo What should I tell my health care provider before I take this medicine? They need to know if you have any of these conditions:  kidney disease  latex allergy  ongoing radiation therapy  sickle cell disease  skin reactions to acrylic adhesives (On-Body Injector  only)  an unusual or allergic reaction to pegfilgrastim, filgrastim, other medicines, foods, dyes, or preservatives  pregnant or trying to get pregnant  breast-feeding How should I use this medicine? This medicine is for injection under the skin. If you get this medicine at home, you will be taught how to prepare and give the pre-filled syringe or how to use the On-body Injector. Refer to the patient Instructions for Use for  detailed instructions. Use exactly as directed. Tell your healthcare provider immediately if you suspect that the On-body Injector may not have performed as intended or if you suspect the use of the On-body Injector resulted in a missed or partial dose. It is important that you put your used needles and syringes in a special sharps container. Do not put them in a trash can. If you do not have a sharps container, call your pharmacist or healthcare provider to get one. Talk to your pediatrician regarding the use of this medicine in children. While this drug may be prescribed for selected conditions, precautions do apply. Overdosage: If you think you have taken too much of this medicine contact a poison control center or emergency room at once. NOTE: This medicine is only for you. Do not share this medicine with others. What if I miss a dose? It is important not to miss your dose. Call your doctor or health care professional if you miss your dose. If you miss a dose due to an On-body Injector failure or leakage, a new dose should be administered as soon as possible using a single prefilled syringe for manual use. What may interact with this medicine? Interactions have not been studied. This list may not describe all possible interactions. Give your health care provider a list of all the medicines, herbs, non-prescription drugs, or dietary supplements you use. Also tell them if you smoke, drink alcohol, or use illegal drugs. Some items may interact with your medicine. What should I  watch for while using this medicine? Your condition will be monitored carefully while you are receiving this medicine. You may need blood work done while you are taking this medicine. Talk to your health care provider about your risk of cancer. You may be more at risk for certain types of cancer if you take this medicine. If you are going to need a MRI, CT scan, or other procedure, tell your doctor that you are using this medicine (On-Body Injector only). What side effects may I notice from receiving this medicine? Side effects that you should report to your doctor or health care professional as soon as possible:  allergic reactions (skin rash, itching or hives, swelling of the face, lips, or tongue)  back pain  dizziness  fever  pain, redness, or irritation at site where injected  pinpoint red spots on the skin  red or dark-brown urine  shortness of breath or breathing problems  stomach or side pain, or pain at the shoulder  swelling  tiredness  trouble passing urine or change in the amount of urine  unusual bruising or bleeding Side effects that usually do not require medical attention (report to your doctor or health care professional if they continue or are bothersome):  bone pain  muscle pain This list may not describe all possible side effects. Call your doctor for medical advice about side effects. You may report side effects to FDA at 1-800-FDA-1088. Where should I keep my medicine? Keep out of the reach of children. If you are using this medicine at home, you will be instructed on how to store it. Throw away any unused medicine after the expiration date on the label. NOTE: This sheet is a summary. It may not cover all possible information. If you have questions about this medicine, talk to your doctor, pharmacist, or health care provider.  2021 Elsevier/Gold Standard (2019-04-14 13:20:51)

## 2020-08-12 NOTE — Progress Notes (Signed)
Hematology/Oncology Consult note [facility]  Telephone:([phone] [phone]  Patient Care Team: Derinda Late, MD as PCP - General (Family Medicine) Theodore Demark, RN as Oncology Nurse Navigator   Name of the patient: Anna Stewart  [id number]  12/30/1955   Date of visit: 08/12/20  Diagnosis- pathological prognostic stage Ib invasive mammary carcinoma pT1 cpN0 cM0 ER/PR negative and HER-2 negative  Chief complaint/ Reason for visit-on treatment assessment prior to cycle 3 of dose dense AC chemotherapy  Heme/Onc history: Patient is a 65 year old female who underwent a routine screening bilateral mammogram on 04/25/2020 which suggested a possible mass in the left breast. This was followed by diagnostic mammogram and ultrasound which showed a 7 x 7 x 5 mm hypoechoic mass in the 7 o'clock position of the left breast 8 cm from the nipple. Ultrasound of the left axilla demonstrates 1 normal-appearing left axillary lymph nodes including 1 with borderline diffuse cortical thickening measuring 3.8 mm in maximum thickness. NoLymph nodes with focal or eccentric cortical thickening were seen. The suspicious breast mass was biopsied and was consistent with triple negative grade 3 invasive mammary carcinoma.  1 cm invasive mammary carcinoma that was grade 2 with negative margins. 1 sentinel lymph node was negative for malignancy. Patient underwent lumpectomy with sentinel lymph node biopsy. Final pathologyshowed 1.1 cm grade 2 invasive mammary carcinoma with negative margins. 1 sentinel lymph node was negative for malignancy. PT1c pN 0.  Plan is for adjuvant dose dense AC chemotherapy along with 12 weekly cycles of Taxol  Interval history- tolerated cycle 2 of chemotherapy well. Constipation was better and headaches better controlled.she is truggling to sleep especially in her 1st week after chemo   ECOG PS- 1 Pain scale- 0   Review of  systems- Review of Systems  Constitutional: Positive for malaise/fatigue. Negative for chills, fever and weight loss.  HENT: Negative for congestion, ear discharge and nosebleeds.   Eyes: Negative for blurred vision.  Respiratory: Negative for cough, hemoptysis, sputum production, shortness of breath and wheezing.   Cardiovascular: Negative for chest pain, palpitations, orthopnea and claudication.  Gastrointestinal: Positive for nausea. Negative for abdominal pain, blood in stool, constipation, diarrhea, heartburn, melena and vomiting.  Genitourinary: Negative for dysuria, flank pain, frequency, hematuria and urgency.  Musculoskeletal: Negative for back pain, joint pain and myalgias.  Skin: Negative for rash.  Neurological: Positive for headaches. Negative for dizziness, tingling, focal weakness, seizures and weakness.  Endo/Heme/Allergies: Does not bruise/bleed easily.  Psychiatric/Behavioral: Negative for depression and suicidal ideas. The patient has insomnia.       Allergies  Allergen Reactions  . Keppra [Levetiracetam]   . Tegretol [Carbamazepine]      Past Medical History:  Diagnosis Date  . Allergy   . AR (allergic rhinitis)   . Arthritis   . Family history of brain cancer   . Family history of lung cancer   . Hair loss   . Hypertension    x 5years  . Migraine   . Psoriasis   . Seizure ([place])    x 17years  . Seizure disorder ([place])   . Vitamin D deficiency      Past Surgical History:  Procedure Laterality Date  . BREAST BIOPSY Right    benign ? date-before 2006  . BREAST BIOPSY Left 05/30/2020   [place] bx, coil marker, path pending  . BREAST LUMPECTOMY WITH RADIOACTIVE SEED AND SENTINEL LYMPH NODE BIOPSY Left 06/24/2020   Procedure: LEFT BREAST LUMPECTOMY WITH RADIOACTIVE  SEED AND LEFT AXILLARY SENTINEL LYMPH NODE BIOPSY;  Surgeon: Rolm Bookbinder, MD;  Location: [place];  Service: General;  Laterality: Left;  . CESAREAN SECTION    .  CHOLECYSTECTOMY    . KNEE SURGERY    . PORTACATH PLACEMENT Right 06/24/2020   Procedure: INSERTION PORT-A-CATH;  Surgeon: Rolm Bookbinder, MD;  Location: [place];  Service: General;  Laterality: Right;  . TONSILLECTOMY    . TUBAL LIGATION      Social History   Socioeconomic History  . Marital status: Married    Spouse name: Event organiser  . Number of children: 3  . Years of education: College  . Highest education level: Not on file  Occupational History  . Occupation:      Employer: OTHER    Comment: Publishing copy  Tobacco Use  . Smoking status: Never Smoker  . Smokeless tobacco: Never Used  Vaping Use  . Vaping Use: Never used  Substance and Sexual Activity  . Alcohol use: Yes    Comment: Rarely- wine  . Drug use: No  . Sexual activity: Yes    Birth control/protection: Post-menopausal  Other Topics Concern  . Not on file  Social History Narrative   Patient lives at home with her family.   Caffeine Use: 1 cups of coffee daily   Social Determinants of Health   Financial Resource Strain: Not on file  Food Insecurity: Not on file  Transportation Needs: Not on file  Physical Activity: Not on file  Stress: Not on file  Social Connections: Not on file  Intimate Partner Violence: Not on file    Family History  Problem Relation Age of Onset  . Brain cancer Mother 4       tumor  . Diabetes Father   . Heart disease Father   . Lung cancer Father   . Cancer Paternal Aunt   . Diabetes Maternal Grandmother   . Stroke Maternal Grandfather   . Breast cancer Neg Hx      Current Outpatient Medications:  .  cetirizine (ZYRTEC) 10 MG tablet, Take 10 mg by mouth daily., Disp: , Rfl:  .  dexamethasone (DECADRON) 4 MG tablet, Take 2 tablets (8 mg total) by mouth daily. Take daily for 3 days after chemo. Take with food., Disp: 30 tablet, Rfl: 1 .  fluticasone (FLONASE) 50 MCG/ACT nasal spray, Place 2 sprays into both nostrils daily., Disp: , Rfl:   .  LAMICTAL XR 200 MG TB24, Take 1 tablet (200 mg total) by mouth 2 (two) times daily. Brand Medically Necessary, Disp: 180 tablet, Rfl: 3 .  lidocaine-prilocaine (EMLA) cream, Apply to affected area once, Disp: 30 g, Rfl: 3 .  lisinopril-hydrochlorothiazide (ZESTORETIC) 10-12.5 MG tablet, Take 1 tablet by mouth daily. Pt states 1/2 tablet a day, Disp: , Rfl:  .  loratadine (CLARITIN) 10 MG tablet, Take 10 mg by mouth daily., Disp: , Rfl:  .  LORazepam (ATIVAN) 0.5 MG tablet, Take 1 tablet (0.5 mg total) by mouth every 6 (six) hours as needed (Nausea or vomiting)., Disp: 30 tablet, Rfl: 0 .  Multiple Vitamin (MULTI-VITAMINS) TABS, Take 1 tablet by mouth daily. , Disp: , Rfl:  .  ondansetron (ZOFRAN) 8 MG tablet, Take 1 tablet (8 mg total) by mouth 2 (two) times daily as needed. Start on the third day after chemotherapy. (Patient not taking: Reported on 07/15/2020), Disp: 30 tablet, Rfl: 1 .  oxyCODONE (OXY IR/ROXICODONE) 5 MG immediate release tablet, Take 1  tablet (5 mg total) by mouth every 6 (six) hours as needed., Disp: 30 tablet, Rfl: 0 .  pantoprazole (PROTONIX) 20 MG tablet, Take 20 mg by mouth daily., Disp: , Rfl:  .  prochlorperazine (COMPAZINE) 10 MG tablet, TAKE 1 TABLET(10 MG) BY MOUTH EVERY 6 HOURS AS NEEDED FOR NAUSEA OR VOMITING, Disp: 30 tablet, Rfl: 1 .  traMADol (ULTRAM) 50 MG tablet, Take 1 tablet (50 mg total) by mouth every 6 (six) hours as needed., Disp: 30 tablet, Rfl: 0  Physical exam:  Vitals:   08/12/20 0933  BP: 123/81  Pulse: 99  Resp: 18  Temp: (!) 96.4 F (35.8 C)  TempSrc: Tympanic  SpO2: 97%  Weight: 187 lb (84.8 kg)   Physical Exam HENT:     Head: Normocephalic and atraumatic.  Eyes:     Pupils: Pupils are equal, round, and reactive to light.  Cardiovascular:     Rate and Rhythm: Normal rate and regular rhythm.     Heart sounds: Normal heart sounds.  Pulmonary:     Effort: Pulmonary effort is normal.     Breath sounds: Normal breath sounds.   Abdominal:     General: Bowel sounds are normal.     Palpations: Abdomen is soft.  Musculoskeletal:     Cervical back: Normal range of motion.  Skin:    General: Skin is warm and dry.  Neurological:     Mental Status: She is alert and oriented to person, place, and time.      CMP Latest Ref Rng & Units 08/12/2020  Glucose 70 - 99 mg/dL 108(H)  BUN 8 - 23 mg/dL 16  Creatinine 0.44 - 1.00 mg/dL 0.70  Sodium 135 - 145 mmol/L 138  Potassium 3.5 - 5.1 mmol/L 4.1  Chloride 98 - 111 mmol/L 103  CO2 22 - 32 mmol/L 24  Calcium 8.9 - 10.3 mg/dL 9.2  Total Protein 6.5 - 8.1 g/dL 7.0  Total Bilirubin 0.3 - 1.2 mg/dL 0.5  Alkaline Phos 38 - 126 U/L 75  AST 15 - 41 U/L 21  ALT 0 - 44 U/L 24   CBC Latest Ref Rng & Units 08/12/2020  WBC 4.0 - 10.5 K/uL 6.0  Hemoglobin 12.0 - 15.0 g/dL 12.3  Hematocrit 36.0 - 46.0 % 36.4  Platelets 150 - 400 K/uL 242      Assessment and plan- Patient is a 65 y.o. female with pathological prognostic stage Ib invasive mammary carcinoma triple negative pT1 cpN0 cM0 s/p lumpectomy and sentinel lymph node biopsy. She is here for on treatment assessment prior to cycle 3 of dose dense AC chemotherapy  Counts okay to proceed with cycle 3 of dose dense AC chemotherapy today with on pro Neulasta support.  I will see her back in 2 weeks for cycle 4  Insomnia- will start trazodone 50 mg QHS.    Visit Diagnosis 1. Encounter for antineoplastic chemotherapy   2. Malignant neoplasm of lower-outer quadrant of left breast of female, estrogen receptor negative ([place])   3. Other insomnia      Dr. [name], MD, MPH [facility] [phone] 08/12/2020 12:31 PM

## 2020-08-26 ENCOUNTER — Inpatient Hospital Stay: Payer: 59

## 2020-08-26 ENCOUNTER — Inpatient Hospital Stay (HOSPITAL_BASED_OUTPATIENT_CLINIC_OR_DEPARTMENT_OTHER): Payer: 59 | Admitting: Oncology

## 2020-08-26 ENCOUNTER — Encounter: Payer: Self-pay | Admitting: Oncology

## 2020-08-26 ENCOUNTER — Other Ambulatory Visit: Payer: Self-pay

## 2020-08-26 VITALS — BP 128/70 | HR 98 | Temp 96.0°F | Resp 18 | Wt 187.0 lb

## 2020-08-26 DIAGNOSIS — Z171 Estrogen receptor negative status [ER-]: Secondary | ICD-10-CM

## 2020-08-26 DIAGNOSIS — D6481 Anemia due to antineoplastic chemotherapy: Secondary | ICD-10-CM | POA: Diagnosis not present

## 2020-08-26 DIAGNOSIS — T451X5A Adverse effect of antineoplastic and immunosuppressive drugs, initial encounter: Secondary | ICD-10-CM | POA: Diagnosis not present

## 2020-08-26 DIAGNOSIS — C50512 Malignant neoplasm of lower-outer quadrant of left female breast: Secondary | ICD-10-CM

## 2020-08-26 DIAGNOSIS — Z5111 Encounter for antineoplastic chemotherapy: Secondary | ICD-10-CM

## 2020-08-26 LAB — CBC WITH DIFFERENTIAL/PLATELET
Abs Immature Granulocytes: 0.29 10*3/uL — ABNORMAL HIGH (ref 0.00–0.07)
Basophils Absolute: 0.1 10*3/uL (ref 0.0–0.1)
Basophils Relative: 1 %
Eosinophils Absolute: 0 10*3/uL (ref 0.0–0.5)
Eosinophils Relative: 0 %
HCT: 33.4 % — ABNORMAL LOW (ref 36.0–46.0)
Hemoglobin: 11.1 g/dL — ABNORMAL LOW (ref 12.0–15.0)
Immature Granulocytes: 5 %
Lymphocytes Relative: 14 %
Lymphs Abs: 0.8 10*3/uL (ref 0.7–4.0)
MCH: 30.5 pg (ref 26.0–34.0)
MCHC: 33.2 g/dL (ref 30.0–36.0)
MCV: 91.8 fL (ref 80.0–100.0)
Monocytes Absolute: 0.9 10*3/uL (ref 0.1–1.0)
Monocytes Relative: 17 %
Neutro Abs: 3.4 10*3/uL (ref 1.7–7.7)
Neutrophils Relative %: 63 %
Platelets: 238 10*3/uL (ref 150–400)
RBC: 3.64 MIL/uL — ABNORMAL LOW (ref 3.87–5.11)
RDW: 16 % — ABNORMAL HIGH (ref 11.5–15.5)
WBC: 5.4 10*3/uL (ref 4.0–10.5)
nRBC: 0.9 % — ABNORMAL HIGH (ref 0.0–0.2)

## 2020-08-26 LAB — COMPREHENSIVE METABOLIC PANEL
ALT: 24 U/L (ref 0–44)
AST: 19 U/L (ref 15–41)
Albumin: 4 g/dL (ref 3.5–5.0)
Alkaline Phosphatase: 72 U/L (ref 38–126)
Anion gap: 11 (ref 5–15)
BUN: 14 mg/dL (ref 8–23)
CO2: 26 mmol/L (ref 22–32)
Calcium: 9.3 mg/dL (ref 8.9–10.3)
Chloride: 103 mmol/L (ref 98–111)
Creatinine, Ser: 0.66 mg/dL (ref 0.44–1.00)
GFR, Estimated: >60 mL/min (ref >60)
Glucose, Bld: 102 mg/dL — ABNORMAL HIGH (ref 70–99)
Potassium: 3.6 mmol/L (ref 3.5–5.1)
Sodium: 140 mmol/L (ref 135–145)
Total Bilirubin: 0.4 mg/dL (ref 0.3–1.2)
Total Protein: 6.6 g/dL (ref 6.5–8.1)

## 2020-08-26 MED ORDER — PEGFILGRASTIM 6 MG/0.6ML ~~LOC~~ PSKT
6.0000 mg | PREFILLED_SYRINGE | Freq: Once | SUBCUTANEOUS | Status: AC
  Administered 2020-08-26: 6 mg via SUBCUTANEOUS
  Filled 2020-08-26: qty 0.6

## 2020-08-26 MED ORDER — HEPARIN SOD (PORK) LOCK FLUSH 100 UNIT/ML IV SOLN
INTRAVENOUS | Status: AC
  Filled 2020-08-26: qty 5

## 2020-08-26 MED ORDER — DOXORUBICIN HCL CHEMO IV INJECTION 2 MG/ML
60.0000 mg/m2 | Freq: Once | INTRAVENOUS | Status: AC
Start: 2020-08-26 — End: 2020-08-26
  Administered 2020-08-26: 120 mg via INTRAVENOUS
  Filled 2020-08-26: qty 50

## 2020-08-26 MED ORDER — HEPARIN SOD (PORK) LOCK FLUSH 100 UNIT/ML IV SOLN
500.0000 [IU] | Freq: Once | INTRAVENOUS | Status: AC | PRN
  Administered 2020-08-26: 500 [IU]
  Filled 2020-08-26: qty 5

## 2020-08-26 MED ORDER — CYCLOPHOSPHAMIDE CHEMO INJECTION 1 GM
600.0000 mg/m2 | Freq: Once | INTRAMUSCULAR | Status: AC
Start: 2020-08-26 — End: 2020-08-26
  Administered 2020-08-26: 1200 mg via INTRAVENOUS
  Filled 2020-08-26: qty 60

## 2020-08-26 MED ORDER — SODIUM CHLORIDE 0.9 % IV SOLN
Freq: Once | INTRAVENOUS | Status: AC
  Filled 2020-08-26: qty 250

## 2020-08-26 MED ORDER — PALONOSETRON HCL INJECTION 0.25 MG/5ML
0.2500 mg | Freq: Once | INTRAVENOUS | Status: AC
  Administered 2020-08-26: 0.25 mg via INTRAVENOUS
  Filled 2020-08-26: qty 5

## 2020-08-26 MED ORDER — HEPARIN SOD (PORK) LOCK FLUSH 100 UNIT/ML IV SOLN
500.0000 [IU] | Freq: Once | INTRAVENOUS | Status: DC
  Filled 2020-08-26: qty 5

## 2020-08-26 MED ORDER — SODIUM CHLORIDE 0.9% FLUSH
10.0000 mL | INTRAVENOUS | Status: DC | PRN
  Administered 2020-08-26: 10 mL via INTRAVENOUS
  Filled 2020-08-26: qty 10

## 2020-08-26 MED ORDER — SODIUM CHLORIDE 0.9 % IV SOLN
10.0000 mg | Freq: Once | INTRAVENOUS | Status: AC
  Administered 2020-08-26: 10 mg via INTRAVENOUS
  Filled 2020-08-26: qty 10

## 2020-08-26 MED ORDER — SODIUM CHLORIDE 0.9 % IV SOLN
150.0000 mg | Freq: Once | INTRAVENOUS | Status: AC
  Administered 2020-08-26: 150 mg via INTRAVENOUS
  Filled 2020-08-26: qty 150

## 2020-08-26 NOTE — Progress Notes (Signed)
Patient here for oncology follow-up appointment, expresses concerns of dark stools and diarrhea

## 2020-08-26 NOTE — Progress Notes (Signed)
Hematology/Oncology Consult note [facility]  [phone] [phone]  Patient Care Team: Derinda Late, MD as PCP - General (Family Medicine) Theodore Demark, RN as Oncology Nurse Navigator   Name of the patient: Anna Stewart  [id number]  [birth year]   Date of visit: 08/26/20  Diagnosis- pathological prognostic stage Ib invasive mammary carcinoma pT1 cpN0 cM0 ER/PR negative and HER-2 negative  Chief complaint/ Reason for visit-on treatment assessment prior to cycle 4 of dose dense AC chemotherapy  Heme/Onc history: Patient is a 65 year old female who underwent a routine screening bilateral mammogram on 04/25/2020 which suggested a possible mass in the left breast. This was followed by diagnostic mammogram and ultrasound which showed a 7 x 7 x 5 mm hypoechoic mass in the 7 o'clock position of the left breast 8 cm from the nipple. Ultrasound of the left axilla demonstrates 1 normal-appearing left axillary lymph nodes including 1 with borderline diffuse cortical thickening measuring 3.8 mm in maximum thickness. NoLymph nodes with focal or eccentric cortical thickening were seen. The suspicious breast mass was biopsied and was consistent with triple negative grade 3 invasive mammary carcinoma.  1 cm invasive mammary carcinoma that was grade 2 with negative margins. 1 sentinel lymph node was negative for malignancy. Patient underwent lumpectomy with sentinel lymph node biopsy. Final pathologyshowed 1.1 cm grade 2 invasive mammary carcinoma with negative margins. 1 sentinel lymph node was negative for malignancy. PT1c pN 0.  Plan is for adjuvant dose dense AC chemotherapy along with 12 weekly cycles of Taxol  Interval history-patient reports ongoing fatigue with present chemotherapy.  She does get some Neulasta associated bone pain as well for which she takes as needed oxycodone.  No significant nausea or vomiting  ECOG PS- 1 Pain  scale- 0  Review of systems- Review of Systems  Constitutional: Positive for malaise/fatigue. Negative for chills, fever and weight loss.  HENT: Negative for congestion, ear discharge and nosebleeds.   Eyes: Negative for blurred vision.  Respiratory: Negative for cough, hemoptysis, sputum production, shortness of breath and wheezing.   Cardiovascular: Negative for chest pain, palpitations, orthopnea and claudication.  Gastrointestinal: Negative for abdominal pain, blood in stool, constipation, diarrhea, heartburn, melena, nausea and vomiting.  Genitourinary: Negative for dysuria, flank pain, frequency, hematuria and urgency.  Musculoskeletal: Negative for back pain, joint pain and myalgias.  Skin: Negative for rash.  Neurological: Negative for dizziness, tingling, focal weakness, seizures, weakness and headaches.  Endo/Heme/Allergies: Does not bruise/bleed easily.  Psychiatric/Behavioral: Negative for depression and suicidal ideas. The patient does not have insomnia.       Allergies  Allergen Reactions  . Keppra [Levetiracetam]   . Tegretol [Carbamazepine]      Past Medical History:  Diagnosis Date  . Allergy   . AR (allergic rhinitis)   . Arthritis   . Family history of brain cancer   . Family history of lung cancer   . Hair loss   . Hypertension    x 5years  . Migraine   . Psoriasis   . Seizure ([place])    x 17years  . Seizure disorder ([place])   . Vitamin D deficiency      Past Surgical History:  Procedure Laterality Date  . BREAST BIOPSY Right    benign ? date-before 2006  . BREAST BIOPSY Left 05/30/2020   [place] bx, coil marker, path pending  . BREAST LUMPECTOMY WITH RADIOACTIVE SEED AND SENTINEL LYMPH NODE BIOPSY Left 06/24/2020   Procedure: LEFT BREAST LUMPECTOMY  WITH RADIOACTIVE SEED AND LEFT AXILLARY SENTINEL LYMPH NODE BIOPSY;  Surgeon: Rolm Bookbinder, MD;  Location: [place];  Service: General;  Laterality: Left;  . CESAREAN SECTION    .  CHOLECYSTECTOMY    . KNEE SURGERY    . PORTACATH PLACEMENT Right 06/24/2020   Procedure: INSERTION PORT-A-CATH;  Surgeon: Rolm Bookbinder, MD;  Location: [place];  Service: General;  Laterality: Right;  . TONSILLECTOMY    . TUBAL LIGATION      Social History   Socioeconomic History  . Marital status: Married    Spouse name: Event organiser  . Number of children: 3  . Years of education: College  . Highest education level: Not on file  Occupational History  . Occupation:      Employer: OTHER    Comment: Publishing copy  Tobacco Use  . Smoking status: Never Smoker  . Smokeless tobacco: Never Used  Vaping Use  . Vaping Use: Never used  Substance and Sexual Activity  . Alcohol use: Yes    Comment: Rarely- wine  . Drug use: No  . Sexual activity: Yes    Birth control/protection: Post-menopausal  Other Topics Concern  . Not on file  Social History Narrative   Patient lives at home with her family.   Caffeine Use: 1 cups of coffee daily   Social Determinants of Health   Financial Resource Strain: Not on file  Food Insecurity: Not on file  Transportation Needs: Not on file  Physical Activity: Not on file  Stress: Not on file  Social Connections: Not on file  Intimate Partner Violence: Not on file    Family History  Problem Relation Age of Onset  . Brain cancer Mother 76       tumor  . Diabetes Father   . Heart disease Father   . Lung cancer Father   . Cancer Paternal Aunt   . Diabetes Maternal Grandmother   . Stroke Maternal Grandfather   . Breast cancer Neg Hx      Current Outpatient Medications:  .  cetirizine (ZYRTEC) 10 MG tablet, Take 10 mg by mouth daily. (Patient not taking: Reported on 08/12/2020), Disp: , Rfl:  .  dexamethasone (DECADRON) 4 MG tablet, Take 2 tablets (8 mg total) by mouth daily. Take daily for 3 days after chemo. Take with food., Disp: 30 tablet, Rfl: 1 .  fluticasone (FLONASE) 50 MCG/ACT nasal spray, Place 2 sprays  into both nostrils daily., Disp: , Rfl:  .  LAMICTAL XR 200 MG TB24, Take 1 tablet (200 mg total) by mouth 2 (two) times daily. Brand Medically Necessary, Disp: 180 tablet, Rfl: 3 .  lidocaine-prilocaine (EMLA) cream, Apply to affected area once, Disp: 30 g, Rfl: 3 .  lisinopril-hydrochlorothiazide (ZESTORETIC) 10-12.5 MG tablet, Take 1 tablet by mouth daily. Pt states 1/2 tablet a day, Disp: , Rfl:  .  loratadine (CLARITIN) 10 MG tablet, Take 10 mg by mouth daily., Disp: , Rfl:  .  LORazepam (ATIVAN) 0.5 MG tablet, Take 1 tablet (0.5 mg total) by mouth every 6 (six) hours as needed (Nausea or vomiting)., Disp: 30 tablet, Rfl: 0 .  Multiple Vitamin (MULTI-VITAMINS) TABS, Take 1 tablet by mouth daily.  (Patient not taking: Reported on 08/12/2020), Disp: , Rfl:  .  ondansetron (ZOFRAN) 8 MG tablet, Take 1 tablet (8 mg total) by mouth 2 (two) times daily as needed. Start on the third day after chemotherapy. (Patient not taking: No sig reported), Disp: 30 tablet,  Rfl: 1 .  oxyCODONE (OXY IR/ROXICODONE) 5 MG immediate release tablet, Take 1 tablet (5 mg total) by mouth every 6 (six) hours as needed., Disp: 30 tablet, Rfl: 0 .  pantoprazole (PROTONIX) 20 MG tablet, Take 20 mg by mouth daily., Disp: , Rfl:  .  prochlorperazine (COMPAZINE) 10 MG tablet, TAKE 1 TABLET(10 MG) BY MOUTH EVERY 6 HOURS AS NEEDED FOR NAUSEA OR VOMITING, Disp: 30 tablet, Rfl: 1 .  traMADol (ULTRAM) 50 MG tablet, Take 1 tablet (50 mg total) by mouth every 6 (six) hours as needed., Disp: 30 tablet, Rfl: 0 .  traZODone (DESYREL) 50 MG tablet, Take 1 tablet (50 mg total) by mouth at bedtime., Disp: 30 tablet, Rfl: 1 No current facility-administered medications for this visit.  Facility-Administered Medications Ordered in Other Visits:  .  heparin lock flush 100 unit/mL, 500 Units, Intravenous, Once, Sindy Guadeloupe, MD .  sodium chloride flush (NS) 0.9 % injection 10 mL, 10 mL, Intravenous, PRN, Sindy Guadeloupe, MD, 10 mL at 08/26/20  0830  Physical exam:  Vitals:   08/26/20 0849  BP: 128/70  Pulse: 98  Resp: 18  Temp: (!) 96 F (35.6 C)  TempSrc: Tympanic  SpO2: 96%  Weight: 187 lb (84.8 kg)   Physical Exam Cardiovascular:     Rate and Rhythm: Normal rate.     Heart sounds: Normal heart sounds.  Pulmonary:     Effort: Pulmonary effort is normal.  Skin:    General: Skin is warm and dry.  Neurological:     Mental Status: She is alert and oriented to person, place, and time.      CMP Latest Ref Rng & Units 08/12/2020  Glucose 70 - 99 mg/dL 108(H)  BUN 8 - 23 mg/dL 16  Creatinine 0.44 - 1.00 mg/dL 0.70  Sodium 135 - 145 mmol/L 138  Potassium 3.5 - 5.1 mmol/L 4.1  Chloride 98 - 111 mmol/L 103  CO2 22 - 32 mmol/L 24  Calcium 8.9 - 10.3 mg/dL 9.2  Total Protein 6.5 - 8.1 g/dL 7.0  Total Bilirubin 0.3 - 1.2 mg/dL 0.5  Alkaline Phos 38 - 126 U/L 75  AST 15 - 41 U/L 21  ALT 0 - 44 U/L 24   CBC Latest Ref Rng & Units 08/12/2020  WBC 4.0 - 10.5 K/uL 6.0  Hemoglobin 12.0 - 15.0 g/dL 12.3  Hematocrit 36.0 - 46.0 % 36.4  Platelets 150 - 400 K/uL 242      Assessment and plan- Patient is a 65 y.o. female with pathological prognostic stage Ib invasive mammary carcinoma triple negative pT1 cpN0 cM0 s/p lumpectomy and sentinel lymph node biopsy.  She is here for on treatment assessment prior to cycle 4 of dose dense AC chemotherapy  Counts okay to proceed with cycle 4 of dose dense AC chemotherapy with on pro Neulasta support. Return to clinic in 2 weeks.  Port labs CBC with differential, CMP and see covering MD/NP for cycle 1 of weekly Taxol. Labs and cycle 2 of Taxol in 4 weeks.  See Dr. Janese Banks  Patient is going out of town for vacation and therefore will not receive cycle 2 of Taxol in 3 weeks but instead received in 4 weeks  Neulasta associated bone pain: Continue as needed oxycodone  I will reduce Taxol dose to 65 mg per metered squared given her pre-existing mild neuropathy  Chemo induced anemia:  Continue to monitor   Visit Diagnosis 1. Malignant neoplasm of lower-outer quadrant of left breast  of female, estrogen receptor negative ([place])   2. Encounter for antineoplastic chemotherapy   3. Antineoplastic chemotherapy induced anemia      Dr. [name], MD, MPH [facility] at [facility] [phone] 08/26/2020 1:27 PM

## 2020-08-26 NOTE — Patient Instructions (Addendum)
[facility]  Discharge Instructions: Thank you for choosing [facility] to provide your oncology and hematology care.  If you have a lab appointment with the [facility], please go directly to the [facility] and check in at the registration area.  Wear comfortable clothing and clothing appropriate for easy access to any Portacath or PICC line.   We strive to give you quality time with your provider. You may need to reschedule your appointment if you arrive late (15 or more minutes).  Arriving late affects you and other patients whose appointments are after yours.  Also, if you miss three or more appointments without notifying the office, you may be dismissed from the clinic at the provider's discretion.      For prescription refill requests, have your pharmacy contact our office and allow 72 hours for refills to be completed.    Today you received the following chemotherapy and/or immunotherapy agents Cytoxan & Adriamycin  Cyclophosphamide Injection What is this medicine? CYCLOPHOSPHAMIDE (sye kloe FOSS fa mide) is a chemotherapy drug. It slows the growth of cancer cells. This medicine is used to treat many types of cancer like lymphoma, myeloma, leukemia, breast cancer, and ovarian cancer, to name a few. This medicine may be used for other purposes; ask your health care provider or pharmacist if you have questions. COMMON BRAND NAME(S): Cytoxan, Neosar What should I tell my health care provider before I take this medicine? They need to know if you have any of these conditions:  heart disease  history of irregular heartbeat  infection  kidney disease  liver disease  low blood counts, like white cells, platelets, or red blood cells  on hemodialysis  recent or ongoing radiation therapy  scarring or thickening of the lungs  trouble passing urine  an unusual or allergic reaction to cyclophosphamide, other medicines, foods,  dyes, or preservatives  pregnant or trying to get pregnant  breast-feeding How should I use this medicine? This drug is usually given as an injection into a vein or muscle or by infusion into a vein. It is administered in a hospital or clinic by a specially trained health care professional. Talk to your pediatrician regarding the use of this medicine in children. Special care may be needed. Overdosage: If you think you have taken too much of this medicine contact a poison control center or emergency room at once. NOTE: This medicine is only for you. Do not share this medicine with others. What if I miss a dose? It is important not to miss your dose. Call your doctor or health care professional if you are unable to keep an appointment. What may interact with this medicine?  amphotericin B  azathioprine  certain antivirals for HIV or hepatitis  certain medicines for blood pressure, heart disease, irregular heart beat  certain medicines that treat or prevent blood clots like warfarin  certain other medicines for cancer  cyclosporine  etanercept  indomethacin  medicines that relax muscles for surgery  medicines to increase blood counts  metronidazole This list may not describe all possible interactions. Give your health care provider a list of all the medicines, herbs, non-prescription drugs, or dietary supplements you use. Also tell them if you smoke, drink alcohol, or use illegal drugs. Some items may interact with your medicine. What should I watch for while using this medicine? Your condition will be monitored carefully while you are receiving this medicine. You may need blood work done while you are  taking this medicine. Drink water or other fluids as directed. Urinate often, even at night. Some products may contain alcohol. Ask your health care professional if this medicine contains alcohol. Be sure to tell all health care professionals you are taking this medicine.  Certain medicines, like metronidazole and disulfiram, can cause an unpleasant reaction when taken with alcohol. The reaction includes flushing, headache, nausea, vomiting, sweating, and increased thirst. The reaction can last from 30 minutes to several hours. Do not become pregnant while taking this medicine or for 1 year after stopping it. Women should inform their health care professional if they wish to become pregnant or think they might be pregnant. Men should not father a child while taking this medicine and for 4 months after stopping it. There is potential for serious side effects to an unborn child. Talk to your health care professional for more information. Do not breast-feed an infant while taking this medicine or for 1 week after stopping it. This medicine has caused ovarian failure in some women. This medicine may make it more difficult to get pregnant. Talk to your health care professional if you are concerned about your fertility. This medicine has caused decreased sperm counts in some men. This may make it more difficult to father a child. Talk to your health care professional if you are concerned about your fertility. Call your health care professional for advice if you get a fever, chills, or sore throat, or other symptoms of a cold or flu. Do not treat yourself. This medicine decreases your body's ability to fight infections. Try to avoid being around people who are sick. Avoid taking medicines that contain aspirin, acetaminophen, ibuprofen, naproxen, or ketoprofen unless instructed by your health care professional. These medicines may hide a fever. Talk to your health care professional about your risk of cancer. You may be more at risk for certain types of cancer if you take this medicine. If you are going to need surgery or other procedure, tell your health care professional that you are using this medicine. Be careful brushing or flossing your teeth or using a toothpick because you may  get an infection or bleed more easily. If you have any dental work done, tell your dentist you are receiving this medicine. What side effects may I notice from receiving this medicine? Side effects that you should report to your doctor or health care professional as soon as possible:  allergic reactions like skin rash, itching or hives, swelling of the face, lips, or tongue  breathing problems  nausea, vomiting  signs and symptoms of bleeding such as bloody or black, tarry stools; red or dark brown urine; spitting up blood or brown material that looks like coffee grounds; red spots on the skin; unusual bruising or bleeding from the eyes, gums, or nose  signs and symptoms of heart failure like fast, irregular heartbeat, sudden weight gain; swelling of the ankles, feet, hands  signs and symptoms of infection like fever; chills; cough; sore throat; pain or trouble passing urine  signs and symptoms of kidney injury like trouble passing urine or change in the amount of urine  signs and symptoms of liver injury like dark yellow or brown urine; general ill feeling or flu-like symptoms; light-colored stools; loss of appetite; nausea; right upper belly pain; unusually weak or tired; yellowing of the eyes or skin Side effects that usually do not require medical attention (report to your doctor or health care professional if they continue or are bothersome):  confusion  decreased hearing  diarrhea  facial flushing  hair loss  headache  loss of appetite  missed menstrual periods  signs and symptoms of low red blood cells or anemia such as unusually weak or tired; feeling faint or lightheaded; falls  skin discoloration This list may not describe all possible side effects. Call your doctor for medical advice about side effects. You may report side effects to FDA at 1-800-FDA-1088. Where should I keep my medicine? This drug is given in a hospital or clinic and will not be stored at  home. NOTE: This sheet is a summary. It may not cover all possible information. If you have questions about this medicine, talk to your doctor, pharmacist, or health care provider.  2021 Elsevier/Gold Standard (2018-12-26 09:53:29) Doxorubicin injection What is this medicine? DOXORUBICIN (dox oh ROO bi sin) is a chemotherapy drug. It is used to treat many kinds of cancer like leukemia, lymphoma, neuroblastoma, sarcoma, and Wilms' tumor. It is also used to treat bladder cancer, breast cancer, lung cancer, ovarian cancer, stomach cancer, and thyroid cancer. This medicine may be used for other purposes; ask your health care provider or pharmacist if you have questions. COMMON BRAND NAME(S): Adriamycin, Adriamycin PFS, Adriamycin RDF, Rubex What should I tell my health care provider before I take this medicine? They need to know if you have any of these conditions:  heart disease  history of low blood counts caused by a medicine  liver disease  recent or ongoing radiation therapy  an unusual or allergic reaction to doxorubicin, other chemotherapy agents, other medicines, foods, dyes, or preservatives  pregnant or trying to get pregnant  breast-feeding How should I use this medicine? This drug is given as an infusion into a vein. It is administered in a hospital or clinic by a specially trained health care professional. If you have pain, swelling, burning or any unusual feeling around the site of your injection, tell your health care professional right away. Talk to your pediatrician regarding the use of this medicine in children. Special care may be needed. Overdosage: If you think you have taken too much of this medicine contact a poison control center or emergency room at once. NOTE: This medicine is only for you. Do not share this medicine with others. What if I miss a dose? It is important not to miss your dose. Call your doctor or health care professional if you are unable to keep an  appointment. What may interact with this medicine? This medicine may interact with the following medications:  6-mercaptopurine  paclitaxel  phenytoin  St. John's Wort  trastuzumab  verapamil This list may not describe all possible interactions. Give your health care provider a list of all the medicines, herbs, non-prescription drugs, or dietary supplements you use. Also tell them if you smoke, drink alcohol, or use illegal drugs. Some items may interact with your medicine. What should I watch for while using this medicine? This drug may make you feel generally unwell. This is not uncommon, as chemotherapy can affect healthy cells as well as cancer cells. Report any side effects. Continue your course of treatment even though you feel ill unless your doctor tells you to stop. There is a maximum amount of this medicine you should receive throughout your life. The amount depends on the medical condition being treated and your overall health. Your doctor will watch how much of this medicine you receive in your lifetime. Tell your doctor if you have taken this medicine before. You may need  blood work done while you are taking this medicine. Your urine may turn red for a few days after your dose. This is not blood. If your urine is dark or brown, call your doctor. In some cases, you may be given additional medicines to help with side effects. Follow all directions for their use. Call your doctor or health care professional for advice if you get a fever, chills or sore throat, or other symptoms of a cold or flu. Do not treat yourself. This drug decreases your body's ability to fight infections. Try to avoid being around people who are sick. This medicine may increase your risk to bruise or bleed. Call your doctor or health care professional if you notice any unusual bleeding. Talk to your doctor about your risk of cancer. You may be more at risk for certain types of cancers if you take this  medicine. Do not become pregnant while taking this medicine or for 6 months after stopping it. Women should inform their doctor if they wish to become pregnant or think they might be pregnant. Men should not father a child while taking this medicine and for 6 months after stopping it. There is a potential for serious side effects to an unborn child. Talk to your health care professional or pharmacist for more information. Do not breast-feed an infant while taking this medicine. This medicine has caused ovarian failure in some women and reduced sperm counts in some men This medicine may interfere with the ability to have a child. Talk with your doctor or health care professional if you are concerned about your fertility. This medicine may cause a decrease in Co-Enzyme Q-10. You should make sure that you get enough Co-Enzyme Q-10 while you are taking this medicine. Discuss the foods you eat and the vitamins you take with your health care professional. What side effects may I notice from receiving this medicine? Side effects that you should report to your doctor or health care professional as soon as possible:  allergic reactions like skin rash, itching or hives, swelling of the face, lips, or tongue  breathing problems  chest pain  fast or irregular heartbeat  low blood counts - this medicine may decrease the number of white blood cells, red blood cells and platelets. You may be at increased risk for infections and bleeding.  pain, redness, or irritation at site where injected  signs of infection - fever or chills, cough, sore throat, pain or difficulty passing urine  signs of decreased platelets or bleeding - bruising, pinpoint red spots on the skin, black, tarry stools, blood in the urine  swelling of the ankles, feet, hands  tiredness  weakness Side effects that usually do not require medical attention (report to your doctor or health care professional if they continue or are  bothersome):  diarrhea  hair loss  mouth sores  nail discoloration or damage  nausea  red colored urine  vomiting This list may not describe all possible side effects. Call your doctor for medical advice about side effects. You may report side effects to FDA at 1-800-FDA-1088. Where should I keep my medicine? This drug is given in a hospital or clinic and will not be stored at home. NOTE: This sheet is a summary. It may not cover all possible information. If you have questions about this medicine, talk to your doctor, pharmacist, or health care provider.  2021 Elsevier/Gold Standard (2016-11-04 11:01:26)       To help prevent nausea and vomiting after your  treatment, we encourage you to take your nausea medication as directed.  BELOW ARE SYMPTOMS THAT SHOULD BE REPORTED IMMEDIATELY: . *FEVER GREATER THAN 100.4 F (38 C) OR HIGHER . *CHILLS OR SWEATING . *NAUSEA AND VOMITING THAT IS NOT CONTROLLED WITH YOUR NAUSEA MEDICATION . *UNUSUAL SHORTNESS OF BREATH . *UNUSUAL BRUISING OR BLEEDING . *URINARY PROBLEMS (pain or burning when urinating, or frequent urination) . *BOWEL PROBLEMS (unusual diarrhea, constipation, pain near the anus) . TENDERNESS IN MOUTH AND THROAT WITH OR WITHOUT PRESENCE OF ULCERS (sore throat, sores in mouth, or a toothache) . UNUSUAL RASH, SWELLING OR PAIN  . UNUSUAL VAGINAL DISCHARGE OR ITCHING   Items with * indicate a potential emergency and should be followed up as soon as possible or go to the Emergency Department if any problems should occur.  Please show the CHEMOTHERAPY ALERT CARD or IMMUNOTHERAPY ALERT CARD at check-in to the Emergency Department and triage nurse.  Should you have questions after your visit or need to cancel or reschedule your appointment, please contact [facility]  [phone] and follow the prompts.  Office hours are 8:00 a.m. to 4:30 p.m. Monday - Friday. Please note that voicemails left  after 4:00 p.m. may not be returned until the following business day.  We are closed weekends and major holidays. You have access to a nurse at all times for urgent questions. Please call the main number to the clinic [phone] and follow the prompts.  For any non-urgent questions, you may also contact your provider using MyChart. We now offer e-Visits for anyone 72 and older to request care online for non-urgent symptoms. For details visit [email or web address].   Also download the MyChart app! Go to the app store, search "MyChart", open the app, select [facility], and log in with your MyChart username and password.  Due to Covid, a mask is required upon entering the hospital/clinic. If you do not have a mask, one will be given to you upon arrival. For doctor visits, patients may have 1 support person aged 79 or older with them. For treatment visits, patients cannot have anyone with them due to current Covid guidelines and our immunocompromised population. Pegfilgrastim injection What is this medicine? PEGFILGRASTIM (PEG fil gra stim) is a long-acting granulocyte colony-stimulating factor that stimulates the growth of neutrophils, a type of white blood cell important in the body's fight against infection. It is used to reduce the incidence of fever and infection in patients with certain types of cancer who are receiving chemotherapy that affects the bone marrow, and to increase survival after being exposed to high doses of radiation. This medicine may be used for other purposes; ask your health care provider or pharmacist if you have questions. COMMON BRAND NAME(S): Eugenie Filler, Ziextenzo What should I tell my health care provider before I take this medicine? They need to know if you have any of these conditions:  kidney disease  latex allergy  ongoing radiation therapy  sickle cell disease  skin reactions to acrylic adhesives (On-Body Injector only)  an  unusual or allergic reaction to pegfilgrastim, filgrastim, other medicines, foods, dyes, or preservatives  pregnant or trying to get pregnant  breast-feeding How should I use this medicine? This medicine is for injection under the skin. If you get this medicine at home, you will be taught how to prepare and give the pre-filled syringe or how to use the On-body Injector. Refer to the patient Instructions for Use for detailed instructions. Use exactly  as directed. Tell your healthcare provider immediately if you suspect that the On-body Injector may not have performed as intended or if you suspect the use of the On-body Injector resulted in a missed or partial dose. It is important that you put your used needles and syringes in a special sharps container. Do not put them in a trash can. If you do not have a sharps container, call your pharmacist or healthcare provider to get one. Talk to your pediatrician regarding the use of this medicine in children. While this drug may be prescribed for selected conditions, precautions do apply. Overdosage: If you think you have taken too much of this medicine contact a poison control center or emergency room at once. NOTE: This medicine is only for you. Do not share this medicine with others. What if I miss a dose? It is important not to miss your dose. Call your doctor or health care professional if you miss your dose. If you miss a dose due to an On-body Injector failure or leakage, a new dose should be administered as soon as possible using a single prefilled syringe for manual use. What may interact with this medicine? Interactions have not been studied. This list may not describe all possible interactions. Give your health care provider a list of all the medicines, herbs, non-prescription drugs, or dietary supplements you use. Also tell them if you smoke, drink alcohol, or use illegal drugs. Some items may interact with your medicine. What should I watch for  while using this medicine? Your condition will be monitored carefully while you are receiving this medicine. You may need blood work done while you are taking this medicine. Talk to your health care provider about your risk of cancer. You may be more at risk for certain types of cancer if you take this medicine. If you are going to need a MRI, CT scan, or other procedure, tell your doctor that you are using this medicine (On-Body Injector only). What side effects may I notice from receiving this medicine? Side effects that you should report to your doctor or health care professional as soon as possible:  allergic reactions (skin rash, itching or hives, swelling of the face, lips, or tongue)  back pain  dizziness  fever  pain, redness, or irritation at site where injected  pinpoint red spots on the skin  red or dark-brown urine  shortness of breath or breathing problems  stomach or side pain, or pain at the shoulder  swelling  tiredness  trouble passing urine or change in the amount of urine  unusual bruising or bleeding Side effects that usually do not require medical attention (report to your doctor or health care professional if they continue or are bothersome):  bone pain  muscle pain This list may not describe all possible side effects. Call your doctor for medical advice about side effects. You may report side effects to FDA at 1-800-FDA-1088. Where should I keep my medicine? Keep out of the reach of children. If you are using this medicine at home, you will be instructed on how to store it. Throw away any unused medicine after the expiration date on the label. NOTE: This sheet is a summary. It may not cover all possible information. If you have questions about this medicine, talk to your doctor, pharmacist, or health care provider.  2021 Elsevier/Gold Standard (2019-04-14 13:20:51)

## 2020-08-27 ENCOUNTER — Encounter: Payer: Self-pay | Admitting: Oncology

## 2020-08-29 ENCOUNTER — Encounter: Payer: Self-pay | Admitting: Oncology

## 2020-09-03 ENCOUNTER — Encounter: Payer: Self-pay | Admitting: Oncology

## 2020-09-08 NOTE — Progress Notes (Signed)
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   Heme/Onc history: Patient is a 65 year old female who underwent a routine screening bilateral mammogram on 04/25/2020 which suggested a possible mass in the left breast. This was followed by diagnostic mammogram and ultrasound which showed a 7 x 7 x 5 mm hypoechoic mass in the 7 o'clock position of the left breast 8 cm from the nipple. Ultrasound of the left axilla demonstrates 1 normal-appearing left axillary lymph nodes including 1 with borderline diffuse cortical thickening measuring 3.8 mm in maximum thickness. NoLymph nodes with focal or eccentric cortical thickening were seen. The suspicious breast mass was biopsied and was consistent with triple negative grade 3 invasive mammary carcinoma.  1 cm invasive mammary carcinoma that was grade 2 with negative margins. 1 sentinel lymph node was negative for malignancy. Patient underwent lumpectomy with sentinel lymph node biopsy. Final pathologyshowed 1.1 cm grade 2 invasive mammary carcinoma with negative margins. 1 sentinel lymph node was negative for malignancy. PT1c pN 0.  Plan is for adjuvant dose dense AC chemotherapy along with 12 weekly cycles of Taxol  Interval history-patient presents with her husband today.  Reports stable fatigue and feeling poorly after Neulasta injection.  Reports some constipation.  She is taking OTC MiraLAX.  She is very glad to be done with Adriamycin and  Cytoxan.  ECOG PS- 1 Pain scale- 0  Review of systems- Review of Systems  Constitutional: Positive for malaise/fatigue. Negative for chills, fever and weight loss.  HENT: Negative for congestion, ear discharge and nosebleeds.   Eyes: Negative for blurred vision.  Respiratory: Negative for cough, hemoptysis, sputum production, shortness of breath and wheezing.   Cardiovascular: Negative for chest pain, palpitations, orthopnea and claudication.  Gastrointestinal: Negative for abdominal pain, blood in stool, constipation, diarrhea, heartburn, melena, nausea and vomiting.  Genitourinary: Negative for dysuria, flank pain, frequency, hematuria and urgency.  Musculoskeletal: Negative for back pain, joint pain and myalgias.  Skin: Negative for rash.  Neurological: Positive for sensory change. Negative for dizziness, tingling, focal weakness, seizures, weakness and headaches.  Endo/Heme/Allergies: Does not bruise/bleed easily.  Psychiatric/Behavioral: Negative for depression and suicidal ideas. The patient does not have insomnia.       Allergies  Allergen Reactions  . Keppra [Levetiracetam]   . Tegretol [Carbamazepine]      Past Medical History:  Diagnosis Date  . Allergy   . AR (allergic rhinitis)   . Arthritis   . Family history of brain cancer   . Family history of lung cancer   . Hair loss   . Hypertension    x 5years  . Migraine   . Psoriasis   . Seizure ([place])    x 17years  . Seizure disorder ([place])   . Vitamin D deficiency      Past Surgical History:  Procedure Laterality Date  . BREAST BIOPSY Right    benign ? date-before 2006  . BREAST BIOPSY Left 05/30/2020   [place] bx, coil marker, path pending  . BREAST LUMPECTOMY WITH RADIOACTIVE SEED AND SENTINEL LYMPH  NODE BIOPSY Left 06/24/2020   Procedure: LEFT BREAST LUMPECTOMY WITH RADIOACTIVE SEED AND LEFT AXILLARY SENTINEL LYMPH NODE BIOPSY;  Surgeon: Rolm Bookbinder, MD;  Location: [facility];  Service:  General;  Laterality: Left;  . CESAREAN SECTION    . CHOLECYSTECTOMY    . KNEE SURGERY    . PORTACATH PLACEMENT Right 06/24/2020   Procedure: INSERTION PORT-A-CATH;  Surgeon: Rolm Bookbinder, MD;  Location: [facility];  Service: General;  Laterality: Right;  . TONSILLECTOMY    . TUBAL LIGATION      Social History   Socioeconomic History  . Marital status: Married    Spouse name: Event organiser  . Number of children: 3  . Years of education: College  . Highest education level: Not on file  Occupational History  . Occupation:      Employer: OTHER    Comment: Publishing copy  Tobacco Use  . Smoking status: Never Smoker  . Smokeless tobacco: Never Used  Vaping Use  . Vaping Use: Never used  Substance and Sexual Activity  . Alcohol use: Yes    Comment: Rarely- wine  . Drug use: No  . Sexual activity: Yes    Birth control/protection: Post-menopausal  Other Topics Concern  . Not on file  Social History Narrative   Patient lives at home with her family.   Caffeine Use: 1 cups of coffee daily   Social Determinants of Health   Financial Resource Strain: Not on file  Food Insecurity: Not on file  Transportation Needs: Not on file  Physical Activity: Not on file  Stress: Not on file  Social Connections: Not on file  Intimate Partner Violence: Not on file    Family History  Problem Relation Age of Onset  . Brain cancer Mother 79       tumor  . Diabetes Father   . Heart disease Father   . Lung cancer Father   . Cancer Paternal Aunt   . Diabetes Maternal Grandmother   . Stroke Maternal Grandfather   . Breast cancer Neg Hx      Current Outpatient Medications:  .  cetirizine (ZYRTEC) 10 MG tablet, Take 10 mg by mouth daily. (Patient not taking: No sig reported), Disp: , Rfl:  .  dexamethasone (DECADRON) 4 MG tablet, Take 2 tablets (8 mg total) by mouth daily. Take daily for 3 days after chemo. Take with food., Disp: 30 tablet, Rfl: 1 .   fluticasone (FLONASE) 50 MCG/ACT nasal spray, Place 2 sprays into both nostrils daily., Disp: , Rfl:  .  LAMICTAL XR 200 MG TB24, Take 1 tablet (200 mg total) by mouth 2 (two) times daily. Brand Medically Necessary, Disp: 180 tablet, Rfl: 3 .  lidocaine-prilocaine (EMLA) cream, Apply to affected area once, Disp: 30 g, Rfl: 3 .  lisinopril-hydrochlorothiazide (ZESTORETIC) 10-12.5 MG tablet, Take 1 tablet by mouth daily. Pt states 1/2 tablet a day, Disp: , Rfl:  .  loratadine (CLARITIN) 10 MG tablet, Take 10 mg by mouth daily., Disp: , Rfl:  .  LORazepam (ATIVAN) 0.5 MG tablet, Take 1 tablet (0.5 mg total) by mouth every 6 (six) hours as needed (Nausea or vomiting)., Disp: 30 tablet, Rfl: 0 .  Multiple Vitamin (MULTI-VITAMINS) TABS, Take 1 tablet by mouth daily.  (Patient not taking: No sig reported), Disp: , Rfl:  .  ondansetron (ZOFRAN) 8 MG tablet, Take 1 tablet (8 mg total) by mouth 2 (two) times daily as needed. Start on the third day after  chemotherapy. (Patient not taking: No sig reported), Disp: 30 tablet, Rfl: 1 .  oxyCODONE (OXY IR/ROXICODONE) 5 MG immediate release tablet, Take 1 tablet (5 mg total) by mouth every 6 (six) hours as needed., Disp: 30 tablet, Rfl: 0 .  pantoprazole (PROTONIX) 20 MG tablet, Take 20 mg by mouth daily., Disp: , Rfl:  .  prochlorperazine (COMPAZINE) 10 MG tablet, TAKE 1 TABLET(10 MG) BY MOUTH EVERY 6 HOURS AS NEEDED FOR NAUSEA OR VOMITING, Disp: 30 tablet, Rfl: 1 .  traMADol (ULTRAM) 50 MG tablet, Take 1 tablet (50 mg total) by mouth every 6 (six) hours as needed., Disp: 30 tablet, Rfl: 0 .  traZODone (DESYREL) 50 MG tablet, Take 1 tablet (50 mg total) by mouth at bedtime., Disp: 30 tablet, Rfl: 1  Physical exam:  Vitals:   09/09/20 0848  BP: 115/84  Pulse: (!) 106  Temp: (!) 97.4 F (36.3 C)  TempSrc: Tympanic  SpO2: 97%  Weight: 185 lb 8 oz (84.1 kg)   Physical Exam Cardiovascular:     Rate and Rhythm: Normal rate.     Heart sounds: Normal heart  sounds.  Pulmonary:     Effort: Pulmonary effort is normal.  Skin:    General: Skin is warm and dry.  Neurological:     Mental Status: She is alert and oriented to person, place, and time.      CMP Latest Ref Rng & Units 09/09/2020  Glucose 70 - 99 mg/dL 124(H)  BUN 8 - 23 mg/dL 17  Creatinine 0.44 - 1.00 mg/dL 0.70  Sodium 135 - 145 mmol/L 137  Potassium 3.5 - 5.1 mmol/L 3.7  Chloride 98 - 111 mmol/L 101  CO2 22 - 32 mmol/L 22  Calcium 8.9 - 10.3 mg/dL 9.4  Total Protein 6.5 - 8.1 g/dL 6.9  Total Bilirubin 0.3 - 1.2 mg/dL 0.3  Alkaline Phos 38 - 126 U/L 76  AST 15 - 41 U/L 22  ALT 0 - 44 U/L 26   CBC Latest Ref Rng & Units 09/09/2020  WBC 4.0 - 10.5 K/uL 6.4  Hemoglobin 12.0 - 15.0 g/dL 10.9(L)  Hematocrit 36.0 - 46.0 % 32.3(L)  Platelets 150 - 400 K/uL 226      Assessment and plan- Patient is a 65 y.o. female with pathological prognostic stage Ib invasive mammary carcinoma triple negative pT1 cpN0 cM0 s/p lumpectomy and sentinel lymph node biopsy.  She has completed 4 cycles of dose dense AC chemotherapy.  She is here today prior to cycle 1 of weekly dose reduced Taxol.    Counts okay to proceed with cycle 1 Taxol.  She will return to clinic in 2 weeks for cycle 2 Taxol.  Cycle 2 will be pushed out 1 week due to a scheduled vacation.  Peripheral neuropathy: Dr. Janese Banks has already reduced her dose of Taxol.  This is stable.  Fatigue: Secondary to treatment and anemia.   Chemo induced anemia: Labs show a hemoglobin of 10.9.  Monitor for now.   Visit Diagnosis 1. Malignant neoplasm of lower-outer quadrant of left breast of female, estrogen receptor negative ([place])    Greater than 50% was spent in counseling and coordination of care with this patient including but not limited to discussion of the relevant topics above (See A&P) including, but not limited to diagnosis and management of acute and chronic medical conditions.   Faythe Casa, NP 09/10/2020 12:54 PM

## 2020-09-09 ENCOUNTER — Inpatient Hospital Stay (HOSPITAL_BASED_OUTPATIENT_CLINIC_OR_DEPARTMENT_OTHER): Payer: 59 | Admitting: Oncology

## 2020-09-09 ENCOUNTER — Inpatient Hospital Stay: Payer: 59 | Attending: Oncology

## 2020-09-09 ENCOUNTER — Other Ambulatory Visit: Payer: Self-pay

## 2020-09-09 ENCOUNTER — Inpatient Hospital Stay: Payer: 59

## 2020-09-09 VITALS — BP 115/84 | HR 106 | Temp 97.4°F | Wt 185.5 lb

## 2020-09-09 VITALS — BP 131/89 | HR 88 | Temp 97.5°F

## 2020-09-09 DIAGNOSIS — Z171 Estrogen receptor negative status [ER-]: Secondary | ICD-10-CM | POA: Insufficient documentation

## 2020-09-09 DIAGNOSIS — I10 Essential (primary) hypertension: Secondary | ICD-10-CM | POA: Diagnosis not present

## 2020-09-09 DIAGNOSIS — Z5111 Encounter for antineoplastic chemotherapy: Secondary | ICD-10-CM | POA: Diagnosis not present

## 2020-09-09 DIAGNOSIS — Z801 Family history of malignant neoplasm of trachea, bronchus and lung: Secondary | ICD-10-CM | POA: Diagnosis not present

## 2020-09-09 DIAGNOSIS — Z79899 Other long term (current) drug therapy: Secondary | ICD-10-CM | POA: Diagnosis not present

## 2020-09-09 DIAGNOSIS — Z809 Family history of malignant neoplasm, unspecified: Secondary | ICD-10-CM | POA: Insufficient documentation

## 2020-09-09 DIAGNOSIS — C50512 Malignant neoplasm of lower-outer quadrant of left female breast: Secondary | ICD-10-CM | POA: Diagnosis not present

## 2020-09-09 DIAGNOSIS — E559 Vitamin D deficiency, unspecified: Secondary | ICD-10-CM | POA: Diagnosis not present

## 2020-09-09 LAB — COMPREHENSIVE METABOLIC PANEL
ALT: 26 U/L (ref 0–44)
AST: 22 U/L (ref 15–41)
Albumin: 4.2 g/dL (ref 3.5–5.0)
Alkaline Phosphatase: 76 U/L (ref 38–126)
Anion gap: 14 (ref 5–15)
BUN: 17 mg/dL (ref 8–23)
CO2: 22 mmol/L (ref 22–32)
Calcium: 9.4 mg/dL (ref 8.9–10.3)
Chloride: 101 mmol/L (ref 98–111)
Creatinine, Ser: 0.7 mg/dL (ref 0.44–1.00)
GFR, Estimated: >60 mL/min (ref >60)
Glucose, Bld: 124 mg/dL — ABNORMAL HIGH (ref 70–99)
Potassium: 3.7 mmol/L (ref 3.5–5.1)
Sodium: 137 mmol/L (ref 135–145)
Total Bilirubin: 0.3 mg/dL (ref 0.3–1.2)
Total Protein: 6.9 g/dL (ref 6.5–8.1)

## 2020-09-09 LAB — CBC WITH DIFFERENTIAL/PLATELET
Abs Immature Granulocytes: 0.16 10*3/uL — ABNORMAL HIGH (ref 0.00–0.07)
Basophils Absolute: 0.1 10*3/uL (ref 0.0–0.1)
Basophils Relative: 1 %
Eosinophils Absolute: 0 10*3/uL (ref 0.0–0.5)
Eosinophils Relative: 0 %
HCT: 32.3 % — ABNORMAL LOW (ref 36.0–46.0)
Hemoglobin: 10.9 g/dL — ABNORMAL LOW (ref 12.0–15.0)
Immature Granulocytes: 3 %
Lymphocytes Relative: 12 %
Lymphs Abs: 0.7 10*3/uL (ref 0.7–4.0)
MCH: 31.4 pg (ref 26.0–34.0)
MCHC: 33.7 g/dL (ref 30.0–36.0)
MCV: 93.1 fL (ref 80.0–100.0)
Monocytes Absolute: 1.1 10*3/uL — ABNORMAL HIGH (ref 0.1–1.0)
Monocytes Relative: 16 %
Neutro Abs: 4.4 10*3/uL (ref 1.7–7.7)
Neutrophils Relative %: 68 %
Platelets: 226 10*3/uL (ref 150–400)
RBC: 3.47 MIL/uL — ABNORMAL LOW (ref 3.87–5.11)
RDW: 18.1 % — ABNORMAL HIGH (ref 11.5–15.5)
WBC: 6.4 10*3/uL (ref 4.0–10.5)
nRBC: 0.3 % — ABNORMAL HIGH (ref 0.0–0.2)

## 2020-09-09 MED ORDER — SODIUM CHLORIDE 0.9% FLUSH
10.0000 mL | Freq: Once | INTRAVENOUS | Status: AC
  Administered 2020-09-09: 10 mL via INTRAVENOUS
  Filled 2020-09-09: qty 10

## 2020-09-09 MED ORDER — SODIUM CHLORIDE 0.9 % IV SOLN
20.0000 mg | Freq: Once | INTRAVENOUS | Status: AC
  Administered 2020-09-09: 20 mg via INTRAVENOUS
  Filled 2020-09-09: qty 20

## 2020-09-09 MED ORDER — HEPARIN SOD (PORK) LOCK FLUSH 100 UNIT/ML IV SOLN
INTRAVENOUS | Status: AC
  Filled 2020-09-09: qty 5

## 2020-09-09 MED ORDER — SODIUM CHLORIDE 0.9 % IV SOLN
Freq: Once | INTRAVENOUS | Status: AC
  Filled 2020-09-09: qty 250

## 2020-09-09 MED ORDER — HEPARIN SOD (PORK) LOCK FLUSH 100 UNIT/ML IV SOLN
500.0000 [IU] | Freq: Once | INTRAVENOUS | Status: AC | PRN
  Administered 2020-09-09: 500 [IU]
  Filled 2020-09-09: qty 5

## 2020-09-09 MED ORDER — FAMOTIDINE 20 MG IN NS 100 ML IVPB
20.0000 mg | Freq: Once | INTRAVENOUS | Status: AC
Start: 2020-09-09 — End: 2020-09-09
  Administered 2020-09-09: 20 mg via INTRAVENOUS
  Filled 2020-09-09: qty 20

## 2020-09-09 MED ORDER — DIPHENHYDRAMINE HCL 50 MG/ML IJ SOLN
50.0000 mg | Freq: Once | INTRAMUSCULAR | Status: AC
Start: 2020-09-09 — End: 2020-09-09
  Administered 2020-09-09: 50 mg via INTRAVENOUS
  Filled 2020-09-09: qty 1

## 2020-09-09 MED ORDER — SODIUM CHLORIDE 0.9 % IV SOLN
65.0000 mg/m2 | Freq: Once | INTRAVENOUS | Status: AC
  Administered 2020-09-09: 132 mg via INTRAVENOUS
  Filled 2020-09-09: qty 22

## 2020-09-09 NOTE — Progress Notes (Signed)
Patient tolerated her first treatment of Taxol today without any complications. Vital signs remained stable.

## 2020-09-09 NOTE — Progress Notes (Signed)
Per NP ok to treat with HR

## 2020-09-09 NOTE — Patient Instructions (Signed)
[facility]  Discharge Instructions: Thank you for choosing [facility] to provide your oncology and hematology care.  If you have a lab appointment with the [facility], please go directly to the [facility] and check in at the registration area.  Wear comfortable clothing and clothing appropriate for easy access to any Portacath or PICC line.   We strive to give you quality time with your provider. You may need to reschedule your appointment if you arrive late (15 or more minutes).  Arriving late affects you and other patients whose appointments are after yours.  Also, if you miss three or more appointments without notifying the office, you may be dismissed from the clinic at the provider's discretion.      For prescription refill requests, have your pharmacy contact our office and allow 72 hours for refills to be completed.    Today you received the following chemotherapy and/or immunotherapy agents: Taxol      To help prevent nausea and vomiting after your treatment, we encourage you to take your nausea medication as directed.  BELOW ARE SYMPTOMS THAT SHOULD BE REPORTED IMMEDIATELY: . *FEVER GREATER THAN 100.4 F (38 C) OR HIGHER . *CHILLS OR SWEATING . *NAUSEA AND VOMITING THAT IS NOT CONTROLLED WITH YOUR NAUSEA MEDICATION . *UNUSUAL SHORTNESS OF BREATH . *UNUSUAL BRUISING OR BLEEDING . *URINARY PROBLEMS (pain or burning when urinating, or frequent urination) . *BOWEL PROBLEMS (unusual diarrhea, constipation, pain near the anus) . TENDERNESS IN MOUTH AND THROAT WITH OR WITHOUT PRESENCE OF ULCERS (sore throat, sores in mouth, or a toothache) . UNUSUAL RASH, SWELLING OR PAIN  . UNUSUAL VAGINAL DISCHARGE OR ITCHING   Items with * indicate a potential emergency and should be followed up as soon as possible or go to the Emergency Department if any problems should occur.  Please show the CHEMOTHERAPY ALERT CARD or IMMUNOTHERAPY ALERT  CARD at check-in to the Emergency Department and triage nurse.  Should you have questions after your visit or need to cancel or reschedule your appointment, please contact [facility]  [phone] and follow the prompts.  Office hours are 8:00 a.m. to 4:30 p.m. Monday - Friday. Please note that voicemails left after 4:00 p.m. may not be returned until the following business day.  We are closed weekends and major holidays. You have access to a nurse at all times for urgent questions. Please call the main number to the clinic [phone] and follow the prompts.  For any non-urgent questions, you may also contact your provider using MyChart. We now offer e-Visits for anyone 79 and older to request care online for non-urgent symptoms. For details visit [email or web address].   Also download the MyChart app! Go to the app store, search "MyChart", open the app, select [facility], and log in with your MyChart username and password.  Due to Covid, a mask is required upon entering the hospital/clinic. If you do not have a mask, one will be given to you upon arrival. For doctor visits, patients may have 1 support person aged 48 or older with them. For treatment visits, patients cannot have anyone with them due to current Covid guidelines and our immunocompromised population.  Paclitaxel injection What is this medicine? PACLITAXEL (PAK li TAX el) is a chemotherapy drug. It targets fast dividing cells, like cancer cells, and causes these cells to die. This medicine is used to treat ovarian cancer, breast cancer, lung cancer, Kaposi's sarcoma, and other cancers.  This medicine may be used for other purposes; ask your health care provider or pharmacist if you have questions. COMMON BRAND NAME(S): Onxol, Taxol What should I tell my health care provider before I take this medicine? They need to know if you have any of these conditions:  history of irregular heartbeat  liver  disease  low blood counts, like low white cell, platelet, or red cell counts  lung or breathing disease, like asthma  tingling of the fingers or toes, or other nerve disorder  an unusual or allergic reaction to paclitaxel, alcohol, polyoxyethylated castor oil, other chemotherapy, other medicines, foods, dyes, or preservatives  pregnant or trying to get pregnant  breast-feeding How should I use this medicine? This drug is given as an infusion into a vein. It is administered in a hospital or clinic by a specially trained health care professional. Talk to your pediatrician regarding the use of this medicine in children. Special care may be needed. Overdosage: If you think you have taken too much of this medicine contact a poison control center or emergency room at once. NOTE: This medicine is only for you. Do not share this medicine with others. What if I miss a dose? It is important not to miss your dose. Call your doctor or health care professional if you are unable to keep an appointment. What may interact with this medicine? Do not take this medicine with any of the following medications:  live virus vaccines This medicine may also interact with the following medications:  antiviral medicines for hepatitis, HIV or AIDS  certain antibiotics like erythromycin and clarithromycin  certain medicines for fungal infections like ketoconazole and itraconazole  certain medicines for seizures like carbamazepine, phenobarbital, phenytoin  gemfibrozil  nefazodone  rifampin  St. John's wort This list may not describe all possible interactions. Give your health care provider a list of all the medicines, herbs, non-prescription drugs, or dietary supplements you use. Also tell them if you smoke, drink alcohol, or use illegal drugs. Some items may interact with your medicine. What should I watch for while using this medicine? Your condition will be monitored carefully while you are  receiving this medicine. You will need important blood work done while you are taking this medicine. This medicine can cause serious allergic reactions. To reduce your risk you will need to take other medicine(s) before treatment with this medicine. If you experience allergic reactions like skin rash, itching or hives, swelling of the face, lips, or tongue, tell your doctor or health care professional right away. In some cases, you may be given additional medicines to help with side effects. Follow all directions for their use. This drug may make you feel generally unwell. This is not uncommon, as chemotherapy can affect healthy cells as well as cancer cells. Report any side effects. Continue your course of treatment even though you feel ill unless your doctor tells you to stop. Call your doctor or health care professional for advice if you get a fever, chills or sore throat, or other symptoms of a cold or flu. Do not treat yourself. This drug decreases your body's ability to fight infections. Try to avoid being around people who are sick. This medicine may increase your risk to bruise or bleed. Call your doctor or health care professional if you notice any unusual bleeding. Be careful brushing and flossing your teeth or using a toothpick because you may get an infection or bleed more easily. If you have any dental work done, tell your  dentist you are receiving this medicine. Avoid taking products that contain aspirin, acetaminophen, ibuprofen, naproxen, or ketoprofen unless instructed by your doctor. These medicines may hide a fever. Do not become pregnant while taking this medicine. Women should inform their doctor if they wish to become pregnant or think they might be pregnant. There is a potential for serious side effects to an unborn child. Talk to your health care professional or pharmacist for more information. Do not breast-feed an infant while taking this medicine. Men are advised not to father a  child while receiving this medicine. This product may contain alcohol. Ask your pharmacist or healthcare provider if this medicine contains alcohol. Be sure to tell all healthcare providers you are taking this medicine. Certain medicines, like metronidazole and disulfiram, can cause an unpleasant reaction when taken with alcohol. The reaction includes flushing, headache, nausea, vomiting, sweating, and increased thirst. The reaction can last from 30 minutes to several hours. What side effects may I notice from receiving this medicine? Side effects that you should report to your doctor or health care professional as soon as possible:  allergic reactions like skin rash, itching or hives, swelling of the face, lips, or tongue  breathing problems  changes in vision  fast, irregular heartbeat  high or low blood pressure  mouth sores  pain, tingling, numbness in the hands or feet  signs of decreased platelets or bleeding - bruising, pinpoint red spots on the skin, black, tarry stools, blood in the urine  signs of decreased red blood cells - unusually weak or tired, feeling faint or lightheaded, falls  signs of infection - fever or chills, cough, sore throat, pain or difficulty passing urine  signs and symptoms of liver injury like dark yellow or brown urine; general ill feeling or flu-like symptoms; light-colored stools; loss of appetite; nausea; right upper belly pain; unusually weak or tired; yellowing of the eyes or skin  swelling of the ankles, feet, hands  unusually slow heartbeat Side effects that usually do not require medical attention (report to your doctor or health care professional if they continue or are bothersome):  diarrhea  hair loss  loss of appetite  muscle or joint pain  nausea, vomiting  pain, redness, or irritation at site where injected  tiredness This list may not describe all possible side effects. Call your doctor for medical advice about side effects.  You may report side effects to FDA at 1-800-FDA-1088. Where should I keep my medicine? This drug is given in a hospital or clinic and will not be stored at home. NOTE: This sheet is a summary. It may not cover all possible information. If you have questions about this medicine, talk to your doctor, pharmacist, or health care provider.  2021 Elsevier/Gold Standard (2019-02-22 13:37:23)

## 2020-09-10 ENCOUNTER — Encounter: Payer: Self-pay | Admitting: Oncology

## 2020-09-16 ENCOUNTER — Encounter: Payer: Self-pay | Admitting: *Deleted

## 2020-09-23 ENCOUNTER — Inpatient Hospital Stay: Payer: 59

## 2020-09-23 ENCOUNTER — Encounter: Payer: Self-pay | Admitting: Oncology

## 2020-09-23 ENCOUNTER — Other Ambulatory Visit: Payer: Self-pay

## 2020-09-23 ENCOUNTER — Inpatient Hospital Stay (HOSPITAL_BASED_OUTPATIENT_CLINIC_OR_DEPARTMENT_OTHER): Payer: 59 | Admitting: Oncology

## 2020-09-23 VITALS — BP 125/85 | HR 104 | Temp 96.5°F | Wt 182.5 lb

## 2020-09-23 VITALS — BP 141/80 | HR 86 | Temp 96.1°F | Resp 18

## 2020-09-23 DIAGNOSIS — Z5111 Encounter for antineoplastic chemotherapy: Secondary | ICD-10-CM

## 2020-09-23 DIAGNOSIS — C50512 Malignant neoplasm of lower-outer quadrant of left female breast: Secondary | ICD-10-CM

## 2020-09-23 DIAGNOSIS — Z171 Estrogen receptor negative status [ER-]: Secondary | ICD-10-CM

## 2020-09-23 LAB — CBC WITH DIFFERENTIAL/PLATELET
Abs Immature Granulocytes: 0.01 10*3/uL (ref 0.00–0.07)
Basophils Absolute: 0.1 10*3/uL (ref 0.0–0.1)
Basophils Relative: 2 %
Eosinophils Absolute: 0.2 10*3/uL (ref 0.0–0.5)
Eosinophils Relative: 5 %
HCT: 35 % — ABNORMAL LOW (ref 36.0–46.0)
Hemoglobin: 11.5 g/dL — ABNORMAL LOW (ref 12.0–15.0)
Immature Granulocytes: 0 %
Lymphocytes Relative: 21 %
Lymphs Abs: 0.8 10*3/uL (ref 0.7–4.0)
MCH: 31.4 pg (ref 26.0–34.0)
MCHC: 32.9 g/dL (ref 30.0–36.0)
MCV: 95.6 fL (ref 80.0–100.0)
Monocytes Absolute: 1 10*3/uL (ref 0.1–1.0)
Monocytes Relative: 24 %
Neutro Abs: 2 10*3/uL (ref 1.7–7.7)
Neutrophils Relative %: 48 %
Platelets: 298 10*3/uL (ref 150–400)
RBC: 3.66 MIL/uL — ABNORMAL LOW (ref 3.87–5.11)
RDW: 17.5 % — ABNORMAL HIGH (ref 11.5–15.5)
WBC: 4.1 10*3/uL (ref 4.0–10.5)
nRBC: 0 % (ref 0.0–0.2)

## 2020-09-23 LAB — COMPREHENSIVE METABOLIC PANEL
ALT: 42 U/L (ref 0–44)
AST: 35 U/L (ref 15–41)
Albumin: 4.2 g/dL (ref 3.5–5.0)
Alkaline Phosphatase: 71 U/L (ref 38–126)
Anion gap: 11 (ref 5–15)
BUN: 13 mg/dL (ref 8–23)
CO2: 25 mmol/L (ref 22–32)
Calcium: 9.3 mg/dL (ref 8.9–10.3)
Chloride: 101 mmol/L (ref 98–111)
Creatinine, Ser: 0.81 mg/dL (ref 0.44–1.00)
GFR, Estimated: >60 mL/min (ref >60)
Glucose, Bld: 113 mg/dL — ABNORMAL HIGH (ref 70–99)
Potassium: 3.9 mmol/L (ref 3.5–5.1)
Sodium: 137 mmol/L (ref 135–145)
Total Bilirubin: 0.5 mg/dL (ref 0.3–1.2)
Total Protein: 7.1 g/dL (ref 6.5–8.1)

## 2020-09-23 MED ORDER — HEPARIN SOD (PORK) LOCK FLUSH 100 UNIT/ML IV SOLN
500.0000 [IU] | Freq: Once | INTRAVENOUS | Status: AC | PRN
  Administered 2020-09-23: 500 [IU]
  Filled 2020-09-23: qty 5

## 2020-09-23 MED ORDER — SODIUM CHLORIDE 0.9% FLUSH
10.0000 mL | Freq: Once | INTRAVENOUS | Status: AC
  Administered 2020-09-23: 10 mL via INTRAVENOUS
  Filled 2020-09-23: qty 10

## 2020-09-23 MED ORDER — SODIUM CHLORIDE 0.9 % IV SOLN
65.0000 mg/m2 | Freq: Once | INTRAVENOUS | Status: AC
  Administered 2020-09-23: 132 mg via INTRAVENOUS
  Filled 2020-09-23: qty 22

## 2020-09-23 MED ORDER — DIPHENHYDRAMINE HCL 50 MG/ML IJ SOLN
50.0000 mg | Freq: Once | INTRAMUSCULAR | Status: AC
  Administered 2020-09-23: 50 mg via INTRAVENOUS
  Filled 2020-09-23: qty 1

## 2020-09-23 MED ORDER — DEXAMETHASONE SODIUM PHOSPHATE 100 MG/10ML IJ SOLN
20.0000 mg | Freq: Once | INTRAMUSCULAR | Status: AC
  Administered 2020-09-23: 20 mg via INTRAVENOUS
  Filled 2020-09-23: qty 20

## 2020-09-23 MED ORDER — FAMOTIDINE 20 MG IN NS 100 ML IVPB
20.0000 mg | Freq: Once | INTRAVENOUS | Status: AC
Start: 2020-09-23 — End: 2020-09-23
  Administered 2020-09-23: 20 mg via INTRAVENOUS
  Filled 2020-09-23: qty 20

## 2020-09-23 MED ORDER — SODIUM CHLORIDE 0.9 % IV SOLN
Freq: Once | INTRAVENOUS | Status: AC
  Filled 2020-09-23: qty 250

## 2020-09-23 NOTE — Progress Notes (Signed)
Patient received her chemotherapy of Taxol today. Patient put on ice slippers and mittens during taxol infusion to try and reduce onset of neuropathy.VSS at completion. Discharged to home.

## 2020-09-23 NOTE — Progress Notes (Signed)
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  This was followed by diagnostic mammogram and ultrasound which showed a 7 x 7 x 5 mm hypoechoic mass in the 7 o'clock position of the left breast 8 cm from the nipple.  Ultrasound of the left axilla demonstrates 1 normal-appearing left axillary lymph nodes including 1 with borderline diffuse cortical thickening measuring 3.8 mm in maximum thickness.  No  Lymph nodes with focal or eccentric cortical thickening were seen.  The suspicious breast mass was biopsied and was consistent with triple negative grade 3 invasive mammary carcinoma.   1 cm invasive mammary carcinoma that was grade 2 with negative margins.  1 sentinel lymph node was negative for malignancy.  Patient underwent lumpectomy with sentinel lymph node biopsy.  Final pathology showed 1.1 cm grade 2 invasive mammary carcinoma with negative margins.  1 sentinel lymph node was negative for malignancy.  PT1c  pN 0.  Plan is for adjuvant dose dense AC chemotherapy along with 12 weekly cycles of Taxol  Interval history-patient recently went for vacation to the beach and had a good time there and did not have any significant side effects from treatment.  Reports that her energy levels are slowly getting better.  She has baseline  neuropathy even prior to starting chemotherapy which is essentially stable  ECOG PS- 1 Pain scale- 0   Review of systems- Review of Systems  Constitutional:  Negative for chills, fever, malaise/fatigue and weight loss.  HENT:  Negative for congestion, ear discharge and nosebleeds.   Eyes:  Negative for blurred vision.  Respiratory:  Negative for cough, hemoptysis, sputum production, shortness of breath and wheezing.   Cardiovascular:  Negative for chest pain, palpitations, orthopnea and claudication.  Gastrointestinal:  Negative for abdominal pain, blood in stool, constipation, diarrhea, heartburn, melena, nausea and vomiting.  Genitourinary:  Negative for dysuria, flank pain, frequency, hematuria and urgency.  Musculoskeletal:  Negative for back pain, joint pain and myalgias.  Skin:  Negative for rash.  Neurological:  Negative for dizziness, tingling, focal weakness, seizures, weakness and headaches.  Endo/Heme/Allergies:  Does not bruise/bleed easily.  Psychiatric/Behavioral:  Negative for depression and suicidal ideas. The patient does not have insomnia.      Allergies  Allergen Reactions   Keppra [Levetiracetam]    Tegretol [Carbamazepine]      Past Medical History:  Diagnosis Date   Allergy    AR (allergic rhinitis)    Arthritis    Family history of brain cancer    Family history of lung cancer    Hair loss    Hypertension    x 5years   Migraine    Psoriasis    Seizure ([place])    x 17years   Seizure disorder ([place])    Vitamin D deficiency      Past Surgical History:  Procedure Laterality Date   BREAST BIOPSY Right  benign ? date-before 2006   BREAST BIOPSY Left 05/30/2020   us bx, coil marker, path pending   BREAST LUMPECTOMY WITH RADIOACTIVE SEED AND SENTINEL LYMPH NODE BIOPSY Left 06/24/2020   Procedure: LEFT BREAST LUMPECTOMY WITH RADIOACTIVE SEED AND LEFT AXILLARY SENTINEL LYMPH NODE BIOPSY;  Surgeon: Wakefield, Matthew, MD;  Location: [facility];  Service: General;  Laterality: Left;   CESAREAN SECTION     CHOLECYSTECTOMY     KNEE SURGERY     PORTACATH PLACEMENT Right 06/24/2020   Procedure: INSERTION PORT-A-CATH;  Surgeon: Wakefield, Matthew, MD;  Location: [facility];  Service: General;  Laterality: Right;   TONSILLECTOMY     TUBAL LIGATION      Social History   Socioeconomic History   Marital status: Married    Spouse name: Scott   Number of children: 3   Years of education: College   Highest education level: Not on file  Occupational History   Occupation:      Employer: OTHER    Comment: [employer]  Tobacco Use   Smoking status: Never   Smokeless tobacco: Never  Vaping Use   Vaping Use: Never used  Substance and Sexual Activity   Alcohol use: Yes    Comment: Rarely- wine   Drug use: No   Sexual activity: Yes    Birth control/protection: Post-menopausal  Other Topics Concern   Not on file  Social History Narrative   Patient lives at home with her family.   Caffeine Use: 1 cups of coffee daily   Social Determinants of Health   Financial Resource Strain: Not on file  Food Insecurity: Not on file  Transportation Needs: Not on file  Physical Activity: Not on file  Stress: Not on file  Social Connections: Not on file  Intimate Partner Violence: Not on file    Family History  Problem Relation Age of Onset   Brain cancer Mother 87       tumor   Diabetes Father    Heart disease Father    Lung cancer Father    Cancer Paternal Aunt    Diabetes Maternal Grandmother    Stroke Maternal Grandfather    Breast cancer Neg Hx      Current Outpatient Medications:    cetirizine (ZYRTEC) 10 MG tablet, Take 10 mg by mouth daily. (Patient not taking: No sig reported), Disp: , Rfl:    dexamethasone (DECADRON) 4 MG tablet, Take 2 tablets (8 mg total) by mouth daily. Take daily for 3 days after chemo. Take with food., Disp: 30 tablet, Rfl: 1   fluticasone (FLONASE) 50 MCG/ACT  nasal spray, Place 2 sprays into both nostrils daily., Disp: , Rfl:    LAMICTAL XR 200 MG TB24, Take 1 tablet (200 mg total) by mouth 2 (two) times daily. Brand Medically Necessary, Disp: 180 tablet, Rfl: 3   lidocaine-prilocaine (EMLA) cream, Apply to affected area once, Disp: 30 g, Rfl: 3   lisinopril-hydrochlorothiazide (ZESTORETIC) 10-12.5 MG tablet, Take 1 tablet by mouth daily. Pt states 1/2 tablet a day, Disp: , Rfl:    loratadine (CLARITIN) 10 MG tablet, Take 10 mg by mouth daily., Disp: , Rfl:    LORazepam (ATIVAN) 0.5 MG tablet, Take 1 tablet (0.5 mg total) by mouth every 6 (six) hours as needed (Nausea or vomiting)., Disp: 30 tablet, Rfl: 0   Multiple Vitamin (MULTI-VITAMINS) TABS, Take 1 tablet by mouth daily.  (Patient not taking: No sig reported), Disp: , Rfl:      ondansetron (ZOFRAN) 8 MG tablet, Take 1 tablet (8 mg total) by mouth 2 (two) times daily as needed. Start on the third day after chemotherapy. (Patient not taking: No sig reported), Disp: 30 tablet, Rfl: 1   oxyCODONE (OXY IR/ROXICODONE) 5 MG immediate release tablet, Take 1 tablet (5 mg total) by mouth every 6 (six) hours as needed., Disp: 30 tablet, Rfl: 0   pantoprazole (PROTONIX) 20 MG tablet, Take 20 mg by mouth daily., Disp: , Rfl:    prochlorperazine (COMPAZINE) 10 MG tablet, TAKE 1 TABLET(10 MG) BY MOUTH EVERY 6 HOURS AS NEEDED FOR NAUSEA OR VOMITING, Disp: 30 tablet, Rfl: 1   traMADol (ULTRAM) 50 MG tablet, Take 1 tablet (50 mg total) by mouth every 6 (six) hours as needed., Disp: 30 tablet, Rfl: 0   traZODone (DESYREL) 50 MG tablet, Take 1 tablet (50 mg total) by mouth at bedtime., Disp: 30 tablet, Rfl: 1  Physical exam:  Vitals:   09/23/20 0854  BP: 125/85  Pulse: (!) 104  Temp: (!) 96.5 F (35.8 C)  SpO2: 98%  Weight: 182 lb 8 oz (82.8 kg)   Physical Exam Cardiovascular:     Rate and Rhythm: Tachycardia present.     Heart sounds: Normal heart sounds.  Pulmonary:     Effort: Pulmonary effort is normal.   Skin:    General: Skin is warm and dry.  Neurological:     Mental Status: She is alert and oriented to person, place, and time.     CMP Latest Ref Rng & Units 09/09/2020  Glucose 70 - 99 mg/dL 124(H)  BUN 8 - 23 mg/dL 17  Creatinine 0.44 - 1.00 mg/dL 0.70  Sodium 135 - 145 mmol/L 137  Potassium 3.5 - 5.1 mmol/L 3.7  Chloride 98 - 111 mmol/L 101  CO2 22 - 32 mmol/L 22  Calcium 8.9 - 10.3 mg/dL 9.4  Total Protein 6.5 - 8.1 g/dL 6.9  Total Bilirubin 0.3 - 1.2 mg/dL 0.3  Alkaline Phos 38 - 126 U/L 76  AST 15 - 41 U/L 22  ALT 0 - 44 U/L 26   CBC Latest Ref Rng & Units 09/09/2020  WBC 4.0 - 10.5 K/uL 6.4  Hemoglobin 12.0 - 15.0 g/dL 10.9(L)  Hematocrit 36.0 - 46.0 % 32.3(L)  Platelets 150 - 400 K/uL 226     Assessment and plan- Patient is a 65 y.o. female with pathological prognostic stage Ib invasive mammary carcinoma triple negative pT1 cpN0 cM0 s/p lumpectomy and sentinel lymph node biopsy.  She is here for on treatment assessment prior to cycle 1 of weekly Taxol chemotherapy.  She has completed 4 cycles of dose dense AC chemotherapy  Patient has baseline neuropathy even prior to starting Taxol and therefore dose has been reduced to 65 mg per metered square.  Counts are otherwise okay to proceed with cycle 2 of weekly Taxol chemotherapy today.  She will directly proceed for cycle 3 next week and I will see her back in 2 weeks for cycle 4  Chemo induced anemia: Stable to improved.  Cpntinue to monitor   Visit Diagnosis 1. Encounter for antineoplastic chemotherapy   2. Malignant neoplasm of lower-outer quadrant of left breast of female, estrogen receptor negative ([place])      Dr. [name], MD, MPH  [facility] [phone] 09/23/2020 1:27 PM

## 2020-09-23 NOTE — Patient Instructions (Addendum)
[facility]  Discharge Instructions: Thank you for choosing [facility] to provide your oncology and hematology care.  If you have a lab appointment with the [facility], please go directly to the [facility] and check in at the registration area.  Wear comfortable clothing and clothing appropriate for easy access to any Portacath or PICC line.   We strive to give you quality time with your provider. You may need to reschedule your appointment if you arrive late (15 or more minutes).  Arriving late affects you and other patients whose appointments are after yours.  Also, if you miss three or more appointments without notifying the office, you may be dismissed from the clinic at the provider's discretion.      For prescription refill requests, have your pharmacy contact our office and allow 72 hours for refills to be completed.    Today you received the following chemotherapy and/or immunotherapy agents Taxol   To help prevent nausea and vomiting after your treatment, we encourage you to take your nausea medication as directed.  BELOW ARE SYMPTOMS THAT SHOULD BE REPORTED IMMEDIATELY: *FEVER GREATER THAN 100.4 F (38 C) OR HIGHER *CHILLS OR SWEATING *NAUSEA AND VOMITING THAT IS NOT CONTROLLED WITH YOUR NAUSEA MEDICATION *UNUSUAL SHORTNESS OF BREATH *UNUSUAL BRUISING OR BLEEDING *URINARY PROBLEMS (pain or burning when urinating, or frequent urination) *BOWEL PROBLEMS (unusual diarrhea, constipation, pain near the anus) TENDERNESS IN MOUTH AND THROAT WITH OR WITHOUT PRESENCE OF ULCERS (sore throat, sores in mouth, or a toothache) UNUSUAL RASH, SWELLING OR PAIN  UNUSUAL VAGINAL DISCHARGE OR ITCHING   Items with * indicate a potential emergency and should be followed up as soon as possible or go to the Emergency Department if any problems should occur.  Please show the CHEMOTHERAPY ALERT CARD or IMMUNOTHERAPY ALERT CARD at check-in to the  Emergency Department and triage nurse.  Should you have questions after your visit or need to cancel or reschedule your appointment, please contact [facility]  [phone] and follow the prompts.  Office hours are 8:00 a.m. to 4:30 p.m. Monday - Friday. Please note that voicemails left after 4:00 p.m. may not be returned until the following business day.  We are closed weekends and major holidays. You have access to a nurse at all times for urgent questions. Please call the main number to the clinic [phone] and follow the prompts.  For any non-urgent questions, you may also contact your provider using MyChart. We now offer e-Visits for anyone 65 and older to request care online for non-urgent symptoms. For details visit [email or web address].   Also download the MyChart app! Go to the app store, search "MyChart", open the app, select [facility], and log in with your MyChart username and password.  Due to Covid, a mask is required upon entering the hospital/clinic. If you do not have a mask, one will be given to you upon arrival. For doctor visits, patients may have 1 support person aged 72 or older with them. For treatment visits, patients cannot have anyone with them due to current Covid guidelines and our immunocompromised population. Paclitaxel injection What is this medication? PACLITAXEL (PAK li TAX el) is a chemotherapy drug. It targets fast dividing cells, like cancer cells, and causes these cells to die. This medicine is used to treat ovarian cancer, breast cancer, lung cancer, Kaposi's sarcoma, andother cancers. This medicine may be used for other purposes; ask your health care provider orpharmacist if  you have questions. COMMON BRAND NAME(S): Onxol, Taxol What should I tell my care team before I take this medication? They need to know if you have any of these conditions: history of irregular heartbeat liver disease low blood counts, like low  white cell, platelet, or red cell counts lung or breathing disease, like asthma tingling of the fingers or toes, or other nerve disorder an unusual or allergic reaction to paclitaxel, alcohol, polyoxyethylated castor oil, other chemotherapy, other medicines, foods, dyes, or preservatives pregnant or trying to get pregnant breast-feeding How should I use this medication? This drug is given as an infusion into a vein. It is administered in a hospitalor clinic by a specially trained health care professional. Talk to your pediatrician regarding the use of this medicine in children.Special care may be needed. Overdosage: If you think you have taken too much of this medicine contact apoison control center or emergency room at once. NOTE: This medicine is only for you. Do not share this medicine with others. What if I miss a dose? It is important not to miss your dose. Call your doctor or health careprofessional if you are unable to keep an appointment. What may interact with this medication? Do not take this medicine with any of the following medications: live virus vaccines This medicine may also interact with the following medications: antiviral medicines for hepatitis, HIV or AIDS certain antibiotics like erythromycin and clarithromycin certain medicines for fungal infections like ketoconazole and itraconazole certain medicines for seizures like carbamazepine, phenobarbital, phenytoin gemfibrozil nefazodone rifampin [facility] John's wort This list may not describe all possible interactions. Give your health care provider a list of all the medicines, herbs, non-prescription drugs, or dietary supplements you use. Also tell them if you smoke, drink alcohol, or use illegaldrugs. Some items may interact with your medicine. What should I watch for while using this medication? Your condition will be monitored carefully while you are receiving this medicine. You will need important blood work done while  you are taking thismedicine. This medicine can cause serious allergic reactions. To reduce your risk you will need to take other medicine(s) before treatment with this medicine. If you experience allergic reactions like skin rash, itching or hives, swelling of theface, lips, or tongue, tell your doctor or health care professional right away. In some cases, you may be given additional medicines to help with side effects.Follow all directions for their use. This drug may make you feel generally unwell. This is not uncommon, as chemotherapy can affect healthy cells as well as cancer cells. Report any side effects. Continue your course of treatment even though you feel ill unless yourdoctor tells you to stop. Call your doctor or health care professional for advice if you get a fever, chills or sore throat, or other symptoms of a cold or flu. Do not treat yourself. This drug decreases your body's ability to fight infections. Try toavoid being around people who are sick. This medicine may increase your risk to bruise or bleed. Call your doctor orhealth care professional if you notice any unusual bleeding. Be careful brushing and flossing your teeth or using a toothpick because you may get an infection or bleed more easily. If you have any dental work done,tell your dentist you are receiving this medicine. Avoid taking products that contain aspirin, acetaminophen, ibuprofen, naproxen, or ketoprofen unless instructed by your doctor. These medicines may hide afever. Do not become pregnant while taking this medicine. Women should inform their doctor if they wish to become pregnant  or think they might be pregnant. There is a potential for serious side effects to an unborn child. Talk to your health care professional or pharmacist for more information. Do not breast-feed aninfant while taking this medicine. Men are advised not to father a child while receiving this medicine. This product may contain alcohol. Ask your  pharmacist or healthcare provider if this medicine contains alcohol. Be sure to tell all healthcare providers you are taking this medicine. Certain medicines, like metronidazole and disulfiram, can cause an unpleasant reaction when taken with alcohol. The reaction includes flushing, headache, nausea, vomiting, sweating, and increased thirst. Thereaction can last from 30 minutes to several hours. What side effects may I notice from receiving this medication? Side effects that you should report to your doctor or health care professionalas soon as possible: allergic reactions like skin rash, itching or hives, swelling of the face, lips, or tongue breathing problems changes in vision fast, irregular heartbeat high or low blood pressure mouth sores pain, tingling, numbness in the hands or feet signs of decreased platelets or bleeding - bruising, pinpoint red spots on the skin, black, tarry stools, blood in the urine signs of decreased red blood cells - unusually weak or tired, feeling faint or lightheaded, falls signs of infection - fever or chills, cough, sore throat, pain or difficulty passing urine signs and symptoms of liver injury like dark yellow or brown urine; general ill feeling or flu-like symptoms; light-colored stools; loss of appetite; nausea; right upper belly pain; unusually weak or tired; yellowing of the eyes or skin swelling of the ankles, feet, hands unusually slow heartbeat Side effects that usually do not require medical attention (report to yourdoctor or health care professional if they continue or are bothersome): diarrhea hair loss loss of appetite muscle or joint pain nausea, vomiting pain, redness, or irritation at site where injected tiredness This list may not describe all possible side effects. Call your doctor for medical advice about side effects. You may report side effects to FDA at1-800-FDA-1088. Where should I keep my medication? This drug is given in a  hospital or clinic and will not be stored at home. NOTE: This sheet is a summary. It may not cover all possible information. If you have questions about this medicine, talk to your doctor, pharmacist, orhealth care provider.  2022 Elsevier/Gold Standard (2019-02-22 13:37:23)

## 2020-09-23 NOTE — Progress Notes (Signed)
HR 104 ok to proceed per MD 

## 2020-09-25 ENCOUNTER — Encounter: Payer: Self-pay | Admitting: Oncology

## 2020-09-30 ENCOUNTER — Inpatient Hospital Stay: Payer: 59

## 2020-09-30 ENCOUNTER — Other Ambulatory Visit: Payer: Self-pay

## 2020-09-30 VITALS — BP 113/68 | HR 92 | Temp 97.1°F | Resp 18 | Wt 183.0 lb

## 2020-09-30 DIAGNOSIS — C50512 Malignant neoplasm of lower-outer quadrant of left female breast: Secondary | ICD-10-CM

## 2020-09-30 DIAGNOSIS — Z5111 Encounter for antineoplastic chemotherapy: Secondary | ICD-10-CM | POA: Diagnosis not present

## 2020-09-30 LAB — CBC WITH DIFFERENTIAL/PLATELET
Abs Immature Granulocytes: 0.05 10*3/uL (ref 0.00–0.07)
Basophils Absolute: 0.1 10*3/uL (ref 0.0–0.1)
Basophils Relative: 2 %
Eosinophils Absolute: 0.5 10*3/uL (ref 0.0–0.5)
Eosinophils Relative: 8 %
HCT: 32.8 % — ABNORMAL LOW (ref 36.0–46.0)
Hemoglobin: 10.7 g/dL — ABNORMAL LOW (ref 12.0–15.0)
Immature Granulocytes: 1 %
Lymphocytes Relative: 16 %
Lymphs Abs: 1 10*3/uL (ref 0.7–4.0)
MCH: 31.5 pg (ref 26.0–34.0)
MCHC: 32.6 g/dL (ref 30.0–36.0)
MCV: 96.5 fL (ref 80.0–100.0)
Monocytes Absolute: 0.8 10*3/uL (ref 0.1–1.0)
Monocytes Relative: 13 %
Neutro Abs: 3.8 10*3/uL (ref 1.7–7.7)
Neutrophils Relative %: 60 %
Platelets: 304 10*3/uL (ref 150–400)
RBC: 3.4 MIL/uL — ABNORMAL LOW (ref 3.87–5.11)
RDW: 16.4 % — ABNORMAL HIGH (ref 11.5–15.5)
WBC: 6.2 10*3/uL (ref 4.0–10.5)
nRBC: 0 % (ref 0.0–0.2)

## 2020-09-30 LAB — COMPREHENSIVE METABOLIC PANEL
ALT: 37 U/L (ref 0–44)
AST: 32 U/L (ref 15–41)
Albumin: 4.1 g/dL (ref 3.5–5.0)
Alkaline Phosphatase: 72 U/L (ref 38–126)
Anion gap: 8 (ref 5–15)
BUN: 12 mg/dL (ref 8–23)
CO2: 26 mmol/L (ref 22–32)
Calcium: 9.1 mg/dL (ref 8.9–10.3)
Chloride: 101 mmol/L (ref 98–111)
Creatinine, Ser: 0.68 mg/dL (ref 0.44–1.00)
GFR, Estimated: >60 mL/min (ref >60)
Glucose, Bld: 97 mg/dL (ref 70–99)
Potassium: 3.6 mmol/L (ref 3.5–5.1)
Sodium: 135 mmol/L (ref 135–145)
Total Bilirubin: 0.6 mg/dL (ref 0.3–1.2)
Total Protein: 6.5 g/dL (ref 6.5–8.1)

## 2020-09-30 MED ORDER — HEPARIN SOD (PORK) LOCK FLUSH 100 UNIT/ML IV SOLN
500.0000 [IU] | Freq: Once | INTRAVENOUS | Status: AC
Start: 2020-09-30 — End: 2020-09-30
  Administered 2020-09-30: 500 [IU] via INTRAVENOUS
  Filled 2020-09-30: qty 5

## 2020-09-30 MED ORDER — SODIUM CHLORIDE 0.9% FLUSH
10.0000 mL | INTRAVENOUS | Status: DC | PRN
  Administered 2020-09-30: 10 mL via INTRAVENOUS
  Filled 2020-09-30: qty 10

## 2020-09-30 MED ORDER — DIPHENHYDRAMINE HCL 50 MG/ML IJ SOLN
50.0000 mg | Freq: Once | INTRAMUSCULAR | Status: AC
Start: 2020-09-30 — End: 2020-09-30
  Administered 2020-09-30: 50 mg via INTRAVENOUS
  Filled 2020-09-30: qty 1

## 2020-09-30 MED ORDER — HEPARIN SOD (PORK) LOCK FLUSH 100 UNIT/ML IV SOLN
INTRAVENOUS | Status: AC
  Filled 2020-09-30: qty 5

## 2020-09-30 MED ORDER — SODIUM CHLORIDE 0.9 % IV SOLN
65.0000 mg/m2 | Freq: Once | INTRAVENOUS | Status: AC
  Administered 2020-09-30: 132 mg via INTRAVENOUS
  Filled 2020-09-30: qty 22

## 2020-09-30 MED ORDER — FAMOTIDINE 20 MG IN NS 100 ML IVPB
20.0000 mg | Freq: Once | INTRAVENOUS | Status: AC
  Administered 2020-09-30: 20 mg via INTRAVENOUS
  Filled 2020-09-30: qty 20

## 2020-09-30 MED ORDER — SODIUM CHLORIDE 0.9 % IV SOLN
20.0000 mg | Freq: Once | INTRAVENOUS | Status: AC
  Administered 2020-09-30: 20 mg via INTRAVENOUS
  Filled 2020-09-30: qty 20

## 2020-09-30 MED ORDER — SODIUM CHLORIDE 0.9 % IV SOLN
Freq: Once | INTRAVENOUS | Status: AC
  Filled 2020-09-30: qty 250

## 2020-09-30 NOTE — Patient Instructions (Signed)
[facility]  Discharge Instructions: Thank you for choosing [facility] to provide your oncology and hematology care.  If you have a lab appointment with the Cancer Center, please go directly to the Cancer Center and check in at the registration area.  Wear comfortable clothing and clothing appropriate for easy access to any Portacath or PICC line.   We strive to give you quality time with your provider. You may need to reschedule your appointment if you arrive late (15 or more minutes).  Arriving late affects you and other patients whose appointments are after yours.  Also, if you miss three or more appointments without notifying the office, you may be dismissed from the clinic at the provider's discretion.      For prescription refill requests, have your pharmacy contact our office and allow 72 hours for refills to be completed.    Today you received the following chemotherapy and/or immunotherapy agents Taxol        To help prevent nausea and vomiting after your treatment, we encourage you to take your nausea medication as directed.  BELOW ARE SYMPTOMS THAT SHOULD BE REPORTED IMMEDIATELY: *FEVER GREATER THAN 100.4 F (38 C) OR HIGHER *CHILLS OR SWEATING *NAUSEA AND VOMITING THAT IS NOT CONTROLLED WITH YOUR NAUSEA MEDICATION *UNUSUAL SHORTNESS OF BREATH *UNUSUAL BRUISING OR BLEEDING *URINARY PROBLEMS (pain or burning when urinating, or frequent urination) *BOWEL PROBLEMS (unusual diarrhea, constipation, pain near the anus) TENDERNESS IN MOUTH AND THROAT WITH OR WITHOUT PRESENCE OF ULCERS (sore throat, sores in mouth, or a toothache) UNUSUAL RASH, SWELLING OR PAIN  UNUSUAL VAGINAL DISCHARGE OR ITCHING   Items with * indicate a potential emergency and should be followed up as soon as possible or go to the Emergency Department if any problems should occur.  Please show the CHEMOTHERAPY ALERT CARD or IMMUNOTHERAPY ALERT CARD at check-in to  the Emergency Department and triage nurse.  Should you have questions after your visit or need to cancel or reschedule your appointment, please contact [facility]  [phone] and follow the prompts.  Office hours are 8:00 a.m. to 4:30 p.m. Monday - Friday. Please note that voicemails left after 4:00 p.m. may not be returned until the following business day.  We are closed weekends and major holidays. You have access to a nurse at all times for urgent questions. Please call the main number to the clinic [phone] and follow the prompts.  For any non-urgent questions, you may also contact your provider using MyChart. We now offer e-Visits for anyone 18 and older to request care online for non-urgent symptoms. For details visit [email or web address].   Also download the MyChart app! Go to the app store, search "MyChart", open the app, select [facility], and log in with your MyChart username and password.  Due to Covid, a mask is required upon entering the hospital/clinic. If you do not have a mask, one will be given to you upon arrival. For doctor visits, patients may have 1 support person aged 18 or older with them. For treatment visits, patients cannot have anyone with them due to current Covid guidelines and our immunocompromised population.  

## 2020-10-02 ENCOUNTER — Other Ambulatory Visit: Payer: Self-pay | Admitting: Oncology

## 2020-10-08 ENCOUNTER — Inpatient Hospital Stay (HOSPITAL_BASED_OUTPATIENT_CLINIC_OR_DEPARTMENT_OTHER): Payer: 59 | Admitting: Oncology

## 2020-10-08 ENCOUNTER — Inpatient Hospital Stay: Payer: 59 | Attending: Oncology

## 2020-10-08 ENCOUNTER — Inpatient Hospital Stay: Payer: 59

## 2020-10-08 ENCOUNTER — Encounter: Payer: Self-pay | Admitting: Oncology

## 2020-10-08 VITALS — BP 123/81 | HR 86 | Temp 97.0°F | Resp 18 | Wt 184.0 lb

## 2020-10-08 DIAGNOSIS — D6481 Anemia due to antineoplastic chemotherapy: Secondary | ICD-10-CM | POA: Insufficient documentation

## 2020-10-08 DIAGNOSIS — Z171 Estrogen receptor negative status [ER-]: Secondary | ICD-10-CM

## 2020-10-08 DIAGNOSIS — C50512 Malignant neoplasm of lower-outer quadrant of left female breast: Secondary | ICD-10-CM

## 2020-10-08 DIAGNOSIS — Z5111 Encounter for antineoplastic chemotherapy: Secondary | ICD-10-CM

## 2020-10-08 DIAGNOSIS — T451X5A Adverse effect of antineoplastic and immunosuppressive drugs, initial encounter: Secondary | ICD-10-CM

## 2020-10-08 DIAGNOSIS — Z79899 Other long term (current) drug therapy: Secondary | ICD-10-CM | POA: Diagnosis not present

## 2020-10-08 LAB — CBC WITH DIFFERENTIAL/PLATELET
Abs Immature Granulocytes: 0.02 10*3/uL (ref 0.00–0.07)
Basophils Absolute: 0.1 10*3/uL (ref 0.0–0.1)
Basophils Relative: 1 %
Eosinophils Absolute: 0.4 10*3/uL (ref 0.0–0.5)
Eosinophils Relative: 9 %
HCT: 31.3 % — ABNORMAL LOW (ref 36.0–46.0)
Hemoglobin: 10.4 g/dL — ABNORMAL LOW (ref 12.0–15.0)
Immature Granulocytes: 1 %
Lymphocytes Relative: 18 %
Lymphs Abs: 0.7 10*3/uL (ref 0.7–4.0)
MCH: 32.4 pg (ref 26.0–34.0)
MCHC: 33.2 g/dL (ref 30.0–36.0)
MCV: 97.5 fL (ref 80.0–100.0)
Monocytes Absolute: 0.6 10*3/uL (ref 0.1–1.0)
Monocytes Relative: 16 %
Neutro Abs: 2.2 10*3/uL (ref 1.7–7.7)
Neutrophils Relative %: 55 %
Platelets: 251 10*3/uL (ref 150–400)
RBC: 3.21 MIL/uL — ABNORMAL LOW (ref 3.87–5.11)
RDW: 16.6 % — ABNORMAL HIGH (ref 11.5–15.5)
WBC: 3.9 10*3/uL — ABNORMAL LOW (ref 4.0–10.5)
nRBC: 0 % (ref 0.0–0.2)

## 2020-10-08 LAB — IRON AND TIBC
Iron: 36 ug/dL (ref 28–170)
Saturation Ratios: 11 % (ref 10.4–31.8)
TIBC: 319 ug/dL (ref 250–450)
UIBC: 283 ug/dL

## 2020-10-08 LAB — COMPREHENSIVE METABOLIC PANEL
ALT: 25 U/L (ref 0–44)
AST: 23 U/L (ref 15–41)
Albumin: 3.9 g/dL (ref 3.5–5.0)
Alkaline Phosphatase: 69 U/L (ref 38–126)
Anion gap: 9 (ref 5–15)
BUN: 13 mg/dL (ref 8–23)
CO2: 25 mmol/L (ref 22–32)
Calcium: 9.1 mg/dL (ref 8.9–10.3)
Chloride: 103 mmol/L (ref 98–111)
Creatinine, Ser: 0.72 mg/dL (ref 0.44–1.00)
GFR, Estimated: >60 mL/min (ref >60)
Glucose, Bld: 108 mg/dL — ABNORMAL HIGH (ref 70–99)
Potassium: 3.8 mmol/L (ref 3.5–5.1)
Sodium: 137 mmol/L (ref 135–145)
Total Bilirubin: 0.5 mg/dL (ref 0.3–1.2)
Total Protein: 6.6 g/dL (ref 6.5–8.1)

## 2020-10-08 LAB — VITAMIN B12: Vitamin B-12: 489 pg/mL (ref 180–914)

## 2020-10-08 LAB — FOLATE: Folate: 14.2 ng/mL (ref >5.9)

## 2020-10-08 LAB — FERRITIN: Ferritin: 242 ng/mL (ref 11–307)

## 2020-10-08 MED ORDER — FAMOTIDINE 20 MG IN NS 100 ML IVPB
20.0000 mg | Freq: Once | INTRAVENOUS | Status: AC
  Administered 2020-10-08: 20 mg via INTRAVENOUS
  Filled 2020-10-08: qty 20

## 2020-10-08 MED ORDER — SODIUM CHLORIDE 0.9 % IV SOLN
Freq: Once | INTRAVENOUS | Status: AC
  Filled 2020-10-08: qty 250

## 2020-10-08 MED ORDER — SODIUM CHLORIDE 0.9 % IV SOLN
65.0000 mg/m2 | Freq: Once | INTRAVENOUS | Status: AC
  Administered 2020-10-08: 132 mg via INTRAVENOUS
  Filled 2020-10-08: qty 22

## 2020-10-08 MED ORDER — HEPARIN SOD (PORK) LOCK FLUSH 100 UNIT/ML IV SOLN
500.0000 [IU] | Freq: Once | INTRAVENOUS | Status: AC | PRN
  Administered 2020-10-08: 500 [IU]
  Filled 2020-10-08: qty 5

## 2020-10-08 MED ORDER — DIPHENHYDRAMINE HCL 50 MG/ML IJ SOLN
50.0000 mg | Freq: Once | INTRAMUSCULAR | Status: AC
  Administered 2020-10-08: 50 mg via INTRAVENOUS
  Filled 2020-10-08: qty 1

## 2020-10-08 MED ORDER — HEPARIN SOD (PORK) LOCK FLUSH 100 UNIT/ML IV SOLN
INTRAVENOUS | Status: AC
  Filled 2020-10-08: qty 5

## 2020-10-08 MED ORDER — SODIUM CHLORIDE 0.9% FLUSH
10.0000 mL | INTRAVENOUS | Status: DC | PRN
  Filled 2020-10-08: qty 10

## 2020-10-08 MED ORDER — SODIUM CHLORIDE 0.9 % IV SOLN
20.0000 mg | Freq: Once | INTRAVENOUS | Status: AC
  Administered 2020-10-08: 20 mg via INTRAVENOUS
  Filled 2020-10-08: qty 20

## 2020-10-08 NOTE — Progress Notes (Signed)
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  This was followed by diagnostic mammogram and ultrasound which showed a 7 x 7 x 5 mm hypoechoic mass in the 7 o'clock position of the left breast 8 cm from the nipple.  Ultrasound of the left axilla demonstrates 1 normal-appearing left axillary lymph nodes including 1 with borderline diffuse cortical thickening measuring 3.8 mm in maximum thickness.  No  Lymph nodes with focal or eccentric cortical thickening were seen.  The suspicious breast mass was biopsied and was consistent with triple negative grade 3 invasive mammary carcinoma.   1 cm invasive mammary carcinoma that was grade 2 with negative margins.  1 sentinel lymph node was negative for malignancy.  Patient underwent lumpectomy with sentinel lymph node biopsy.  Final pathology showed 1.1 cm grade 2 invasive mammary carcinoma with negative margins.  1 sentinel lymph node was negative for malignancy.  PT1c  pN 0.  Plan is for adjuvant dose dense AC chemotherapy along with 12 weekly cycles of Taxol  Interval history-patient is tolerating chemotherapy well.  She has baseline neuropathy even prior to starting chemo which is essentially stable.  Denies any new complaints at this time.  ECOG PS- 1 Pain scale- 0   Review  of systems- Review of Systems  Constitutional:  Positive for malaise/fatigue.  Neurological:  Positive for sensory change (Peripheral neuropathy).     Allergies  Allergen Reactions   Keppra [Levetiracetam]    Tegretol [Carbamazepine]      Past Medical History:  Diagnosis Date   Allergy    AR (allergic rhinitis)    Arthritis    Family history of brain cancer    Family history of lung cancer    Hair loss    Hypertension    x 5years   Migraine    Psoriasis    Seizure ([place])    x 17years   Seizure disorder (HCC)    Vitamin D deficiency      Past Surgical History:  Procedure Laterality Date   BREAST BIOPSY Right    benign ? date-before 2006   BREAST BIOPSY Left 05/30/2020   [place] bx, coil marker, path pending   BREAST LUMPECTOMY WITH RADIOACTIVE SEED AND SENTINEL LYMPH NODE BIOPSY Left 06/24/2020   Procedure: LEFT BREAST LUMPECTOMY WITH RADIOACTIVE SEED AND LEFT AXILLARY SENTINEL LYMPH NODE BIOPSY;  Surgeon: Rolm Bookbinder, MD;  Location: [facility];  Service: General;  Laterality: Left;   CESAREAN SECTION     CHOLECYSTECTOMY     KNEE SURGERY     PORTACATH PLACEMENT Right 06/24/2020   Procedure: INSERTION PORT-A-CATH;  Surgeon: Rolm Bookbinder, MD;  Location: [facility];  Service: General;  Laterality: Right;   TONSILLECTOMY     TUBAL LIGATION      Social History   Socioeconomic History   Marital status: Married    Spouse  name: Nicki Reaper   Number of children: 3   Years of education: College   Highest education level: Not on file  Occupational History   Occupation:      Employer: OTHER    Comment: Publishing copy  Tobacco Use   Smoking status: Never   Smokeless tobacco: Never  Vaping Use   Vaping Use: Never used  Substance and Sexual Activity   Alcohol use: Yes    Comment: Rarely- wine   Drug use: No   Sexual activity: Yes    Birth control/protection: Post-menopausal  Other Topics Concern   Not on file  Social  History Narrative   Patient lives at home with her family.   Caffeine Use: 1 cups of coffee daily   Social Determinants of Health   Financial Resource Strain: Not on file  Food Insecurity: Not on file  Transportation Needs: Not on file  Physical Activity: Not on file  Stress: Not on file  Social Connections: Not on file  Intimate Partner Violence: Not on file    Family History  Problem Relation Age of Onset   Brain cancer Mother 14       tumor   Diabetes Father    Heart disease Father    Lung cancer Father    Cancer Paternal Aunt    Diabetes Maternal Grandmother    Stroke Maternal Grandfather    Breast cancer Neg Hx      Current Outpatient Medications:    fluticasone (FLONASE) 50 MCG/ACT nasal spray, Place 2 sprays into both nostrils daily., Disp: , Rfl:    LAMICTAL XR 200 MG TB24, Take 1 tablet (200 mg total) by mouth 2 (two) times daily. Brand Medically Necessary, Disp: 180 tablet, Rfl: 3   lidocaine-prilocaine (EMLA) cream, Apply to affected area once, Disp: 30 g, Rfl: 3   lisinopril-hydrochlorothiazide (ZESTORETIC) 10-12.5 MG tablet, Take 1 tablet by mouth daily. Pt states 1/2 tablet a day, Disp: , Rfl:    loratadine (CLARITIN) 10 MG tablet, Take 10 mg by mouth daily., Disp: , Rfl:    pantoprazole (PROTONIX) 20 MG tablet, Take 20 mg by mouth daily., Disp: , Rfl:    cetirizine (ZYRTEC) 10 MG tablet, Take 10 mg by mouth daily. (Patient not taking: Reported on 10/08/2020), Disp: , Rfl:    dexamethasone (DECADRON) 4 MG tablet, Take 2 tablets (8 mg total) by mouth daily. Take daily for 3 days after chemo. Take with food. (Patient not taking: Reported on 10/08/2020), Disp: 30 tablet, Rfl: 1   LORazepam (ATIVAN) 0.5 MG tablet, Take 1 tablet (0.5 mg total) by mouth every 6 (six) hours as needed (Nausea or vomiting). (Patient not taking: Reported on 10/08/2020), Disp: 30 tablet, Rfl: 0   Multiple Vitamin (MULTI-VITAMINS) TABS, Take 1 tablet by mouth daily.  (Patient not taking: No sig  reported), Disp: , Rfl:    ondansetron (ZOFRAN) 8 MG tablet, Take 1 tablet (8 mg total) by mouth 2 (two) times daily as needed. Start on the third day after chemotherapy. (Patient not taking: No sig reported), Disp: 30 tablet, Rfl: 1   oxyCODONE (OXY IR/ROXICODONE) 5 MG immediate release tablet, Take 1 tablet (5 mg total) by mouth every 6 (six) hours as needed. (Patient not taking: Reported on 10/08/2020), Disp: 30 tablet, Rfl: 0   prochlorperazine (COMPAZINE) 10 MG tablet, TAKE 1 TABLET(10 MG) BY MOUTH EVERY 6 HOURS AS NEEDED FOR NAUSEA OR VOMITING (Patient not taking: Reported on 10/08/2020), Disp: 30 tablet, Rfl: 1   traMADol (ULTRAM)  50 MG tablet, Take 1 tablet (50 mg total) by mouth every 6 (six) hours as needed. (Patient not taking: Reported on 10/08/2020), Disp: 30 tablet, Rfl: 0   traZODone (DESYREL) 50 MG tablet, TAKE 1 TABLET(50 MG) BY MOUTH AT BEDTIME (Patient not taking: Reported on 10/08/2020), Disp: 30 tablet, Rfl: 1 No current facility-administered medications for this visit.  Facility-Administered Medications Ordered in Other Visits:    sodium chloride flush (NS) 0.9 % injection 10 mL, 10 mL, Intracatheter, PRN, Sindy Guadeloupe, MD  Physical exam:  Vitals:   10/08/20 0942  BP: 123/81  Pulse: 86  Resp: 18  Temp: (!) 97 F (36.1 C)  TempSrc: Tympanic  SpO2: 99%  Weight: 184 lb (83.5 kg)   Physical Exam Cardiovascular:     Rate and Rhythm: Normal rate and regular rhythm.     Heart sounds: Normal heart sounds.  Pulmonary:     Effort: Pulmonary effort is normal.     Breath sounds: Normal breath sounds.  Skin:    General: Skin is warm and dry.  Neurological:     Mental Status: She is alert and oriented to person, place, and time.     CMP Latest Ref Rng & Units 10/08/2020  Glucose 70 - 99 mg/dL 108(H)  BUN 8 - 23 mg/dL 13  Creatinine 0.44 - 1.00 mg/dL 0.72  Sodium 135 - 145 mmol/L 137  Potassium 3.5 - 5.1 mmol/L 3.8  Chloride 98 - 111 mmol/L 103  CO2 22 - 32 mmol/L 25   Calcium 8.9 - 10.3 mg/dL 9.1  Total Protein 6.5 - 8.1 g/dL 6.6  Total Bilirubin 0.3 - 1.2 mg/dL 0.5  Alkaline Phos 38 - 126 U/L 69  AST 15 - 41 U/L 23  ALT 0 - 44 U/L 25   CBC Latest Ref Rng & Units 10/08/2020  WBC 4.0 - 10.5 K/uL 3.9(L)  Hemoglobin 12.0 - 15.0 g/dL 10.4(L)  Hematocrit 36.0 - 46.0 % 31.3(L)  Platelets 150 - 400 K/uL 251      Assessment and plan- Patient is a 65 y.o. female  with pathological prognostic stage Ib invasive mammary carcinoma triple negative pT1 cpN0 cM0 s/p lumpectomy and sentinel lymph node biopsy.  She is here for on treatment assessment prior to cycle 3 of weekly Taxol chemotherapy  Counts okay to proceed with cycle 3 of weekly Taxol chemotherapy which she is receiving at a reduced dose of 65 mg per metered square.  She will proceed with cycle 4 next week and I will see her back in 2 weeks for cycle 5.  Plan is to complete 12 weekly cycles of Taxol chemotherapy  Patient has baseline neuropathy even prior to starting Taxol.  It is presently stable grade 1 continue to monitor  Chemo induced anemia:Hemoglobin is drifted down slowly from a baseline of 12 to over 10.4 at this time.  Ferritin and iron studies are normal B12 pending and folate is normal.  Anemia likely secondary to chemotherapy.  Continue to monitor Visit Diagnosis 1. Antineoplastic chemotherapy induced anemia   2. Encounter for antineoplastic chemotherapy   3. Malignant neoplasm of lower-outer quadrant of left breast of female, estrogen receptor negative ([place])      Dr. [name], MD, MPH [facility] [phone] 10/08/2020 12:58 PM

## 2020-10-08 NOTE — Patient Instructions (Addendum)
[facility] ONCOLOGY  Discharge Instructions: Thank you for choosing [facility] to provide your oncology and hematology care.  If you have a lab appointment with the [facility], please go directly to the [facility] and check in at the registration area.  Wear comfortable clothing and clothing appropriate for easy access to any Portacath or PICC line.   We strive to give you quality time with your provider. You may need to reschedule your appointment if you arrive late (15 or more minutes).  Arriving late affects you and other patients whose appointments are after yours.  Also, if you miss three or more appointments without notifying the office, you may be dismissed from the clinic at the provider's discretion.      For prescription refill requests, have your pharmacy contact our office and allow 72 hours for refills to be completed.    Today you received the following chemotherapy and/or immunotherapy agents - paclitaxel      To help prevent nausea and vomiting after your treatment, we encourage you to take your nausea medication as directed.  BELOW ARE SYMPTOMS THAT SHOULD BE REPORTED IMMEDIATELY: *FEVER GREATER THAN 100.4 F (38 C) OR HIGHER *CHILLS OR SWEATING *NAUSEA AND VOMITING THAT IS NOT CONTROLLED WITH YOUR NAUSEA MEDICATION *UNUSUAL SHORTNESS OF BREATH *UNUSUAL BRUISING OR BLEEDING *URINARY PROBLEMS (pain or burning when urinating, or frequent urination) *BOWEL PROBLEMS (unusual diarrhea, constipation, pain near the anus) TENDERNESS IN MOUTH AND THROAT WITH OR WITHOUT PRESENCE OF ULCERS (sore throat, sores in mouth, or a toothache) UNUSUAL RASH, SWELLING OR PAIN  UNUSUAL VAGINAL DISCHARGE OR ITCHING   Items with * indicate a potential emergency and should be followed up as soon as possible or go to the Emergency Department if any problems should occur.  Please show the CHEMOTHERAPY ALERT CARD or IMMUNOTHERAPY ALERT CARD at  check-in to the Emergency Department and triage nurse.  Should you have questions after your visit or need to cancel or reschedule your appointment, please contact [facility]  [phone] and follow the prompts.  Office hours are 8:00 a.m. to 4:30 p.m. Monday - Friday. Please note that voicemails left after 4:00 p.m. may not be returned until the following business day.  We are closed weekends and major holidays. You have access to a nurse at all times for urgent questions. Please call the main number to the clinic [phone] and follow the prompts.  For any non-urgent questions, you may also contact your provider using MyChart. We now offer e-Visits for anyone 31 and older to request care online for non-urgent symptoms. For details visit [email or web address].   Also download the MyChart app! Go to the app store, search "MyChart", open the app, select [facility], and log in with your MyChart username and password.  Due to Covid, a mask is required upon entering the hospital/clinic. If you do not have a mask, one will be given to you upon arrival. For doctor visits, patients may have 1 support person aged 67 or older with them. For treatment visits, patients cannot have anyone with them due to current Covid guidelines and our immunocompromised population.   Paclitaxel injection What is this medication? PACLITAXEL (PAK li TAX el) is a chemotherapy drug. It targets fast dividing cells, like cancer cells, and causes these cells to die. This medicine is used to treat ovarian cancer, breast cancer, lung cancer, Kaposi's sarcoma, andother cancers. This medicine may be used for other purposes; ask  your health care provider orpharmacist if you have questions. COMMON BRAND NAME(S): Onxol, Taxol What should I tell my care team before I take this medication? They need to know if you have any of these conditions: history of irregular heartbeat liver disease low blood  counts, like low white cell, platelet, or red cell counts lung or breathing disease, like asthma tingling of the fingers or toes, or other nerve disorder an unusual or allergic reaction to paclitaxel, alcohol, polyoxyethylated castor oil, other chemotherapy, other medicines, foods, dyes, or preservatives pregnant or trying to get pregnant breast-feeding How should I use this medication? This drug is given as an infusion into a vein. It is administered in a hospitalor clinic by a specially trained health care professional. Talk to your pediatrician regarding the use of this medicine in children.Special care may be needed. Overdosage: If you think you have taken too much of this medicine contact apoison control center or emergency room at once. NOTE: This medicine is only for you. Do not share this medicine with others. What if I miss a dose? It is important not to miss your dose. Call your doctor or health careprofessional if you are unable to keep an appointment. What may interact with this medication? Do not take this medicine with any of the following medications: live virus vaccines This medicine may also interact with the following medications: antiviral medicines for hepatitis, HIV or AIDS certain antibiotics like erythromycin and clarithromycin certain medicines for fungal infections like ketoconazole and itraconazole certain medicines for seizures like carbamazepine, phenobarbital, phenytoin gemfibrozil nefazodone rifampin St. John's wort This list may not describe all possible interactions. Give your health care provider a list of all the medicines, herbs, non-prescription drugs, or dietary supplements you use. Also tell them if you smoke, drink alcohol, or use illegaldrugs. Some items may interact with your medicine. What should I watch for while using this medication? Your condition will be monitored carefully while you are receiving this medicine. You will need important blood  work done while you are taking thismedicine. This medicine can cause serious allergic reactions. To reduce your risk you will need to take other medicine(s) before treatment with this medicine. If you experience allergic reactions like skin rash, itching or hives, swelling of theface, lips, or tongue, tell your doctor or health care professional right away. In some cases, you may be given additional medicines to help with side effects.Follow all directions for their use. This drug may make you feel generally unwell. This is not uncommon, as chemotherapy can affect healthy cells as well as cancer cells. Report any side effects. Continue your course of treatment even though you feel ill unless yourdoctor tells you to stop. Call your doctor or health care professional for advice if you get a fever, chills or sore throat, or other symptoms of a cold or flu. Do not treat yourself. This drug decreases your body's ability to fight infections. Try toavoid being around people who are sick. This medicine may increase your risk to bruise or bleed. Call your doctor orhealth care professional if you notice any unusual bleeding. Be careful brushing and flossing your teeth or using a toothpick because you may get an infection or bleed more easily. If you have any dental work done,tell your dentist you are receiving this medicine. Avoid taking products that contain aspirin, acetaminophen, ibuprofen, naproxen, or ketoprofen unless instructed by your doctor. These medicines may hide afever. Do not become pregnant while taking this medicine. Women should inform their doctor  if they wish to become pregnant or think they might be pregnant. There is a potential for serious side effects to an unborn child. Talk to your health care professional or pharmacist for more information. Do not breast-feed aninfant while taking this medicine. Men are advised not to father a child while receiving this medicine. This product may contain  alcohol. Ask your pharmacist or healthcare provider if this medicine contains alcohol. Be sure to tell all healthcare providers you are taking this medicine. Certain medicines, like metronidazole and disulfiram, can cause an unpleasant reaction when taken with alcohol. The reaction includes flushing, headache, nausea, vomiting, sweating, and increased thirst. Thereaction can last from 30 minutes to several hours. What side effects may I notice from receiving this medication? Side effects that you should report to your doctor or health care professionalas soon as possible: allergic reactions like skin rash, itching or hives, swelling of the face, lips, or tongue breathing problems changes in vision fast, irregular heartbeat high or low blood pressure mouth sores pain, tingling, numbness in the hands or feet signs of decreased platelets or bleeding - bruising, pinpoint red spots on the skin, black, tarry stools, blood in the urine signs of decreased red blood cells - unusually weak or tired, feeling faint or lightheaded, falls signs of infection - fever or chills, cough, sore throat, pain or difficulty passing urine signs and symptoms of liver injury like dark yellow or brown urine; general ill feeling or flu-like symptoms; light-colored stools; loss of appetite; nausea; right upper belly pain; unusually weak or tired; yellowing of the eyes or skin swelling of the ankles, feet, hands unusually slow heartbeat Side effects that usually do not require medical attention (report to yourdoctor or health care professional if they continue or are bothersome): diarrhea hair loss loss of appetite muscle or joint pain nausea, vomiting pain, redness, or irritation at site where injected tiredness This list may not describe all possible side effects. Call your doctor for medical advice about side effects. You may report side effects to FDA at1-800-FDA-1088. Where should I keep my medication? This drug is  given in a hospital or clinic and will not be stored at home. NOTE: This sheet is a summary. It may not cover all possible information. If you have questions about this medicine, talk to your doctor, pharmacist, orhealth care provider.  2022 Elsevier/Gold Standard (2019-02-22 13:37:23)

## 2020-10-14 ENCOUNTER — Other Ambulatory Visit: Payer: Self-pay

## 2020-10-14 ENCOUNTER — Inpatient Hospital Stay: Payer: 59

## 2020-10-14 ENCOUNTER — Ambulatory Visit: Payer: Self-pay

## 2020-10-14 VITALS — BP 123/76 | HR 95 | Temp 97.2°F | Resp 18

## 2020-10-14 DIAGNOSIS — C50512 Malignant neoplasm of lower-outer quadrant of left female breast: Secondary | ICD-10-CM

## 2020-10-14 DIAGNOSIS — Z95828 Presence of other vascular implants and grafts: Secondary | ICD-10-CM

## 2020-10-14 DIAGNOSIS — Z5111 Encounter for antineoplastic chemotherapy: Secondary | ICD-10-CM

## 2020-10-14 LAB — CBC WITH DIFFERENTIAL/PLATELET
Abs Immature Granulocytes: 0.01 10*3/uL (ref 0.00–0.07)
Basophils Absolute: 0.1 10*3/uL (ref 0.0–0.1)
Basophils Relative: 2 %
Eosinophils Absolute: 0.2 10*3/uL (ref 0.0–0.5)
Eosinophils Relative: 6 %
HCT: 33.4 % — ABNORMAL LOW (ref 36.0–46.0)
Hemoglobin: 10.9 g/dL — ABNORMAL LOW (ref 12.0–15.0)
Immature Granulocytes: 0 %
Lymphocytes Relative: 20 %
Lymphs Abs: 0.6 10*3/uL — ABNORMAL LOW (ref 0.7–4.0)
MCH: 31.9 pg (ref 26.0–34.0)
MCHC: 32.6 g/dL (ref 30.0–36.0)
MCV: 97.7 fL (ref 80.0–100.0)
Monocytes Absolute: 0.4 10*3/uL (ref 0.1–1.0)
Monocytes Relative: 14 %
Neutro Abs: 1.7 10*3/uL (ref 1.7–7.7)
Neutrophils Relative %: 58 %
Platelets: 266 10*3/uL (ref 150–400)
RBC: 3.42 MIL/uL — ABNORMAL LOW (ref 3.87–5.11)
RDW: 15.4 % (ref 11.5–15.5)
WBC: 3 10*3/uL — ABNORMAL LOW (ref 4.0–10.5)
nRBC: 0 % (ref 0.0–0.2)

## 2020-10-14 LAB — COMPREHENSIVE METABOLIC PANEL
ALT: 27 U/L (ref 0–44)
AST: 25 U/L (ref 15–41)
Albumin: 4.1 g/dL (ref 3.5–5.0)
Alkaline Phosphatase: 72 U/L (ref 38–126)
Anion gap: 10 (ref 5–15)
BUN: 13 mg/dL (ref 8–23)
CO2: 25 mmol/L (ref 22–32)
Calcium: 9.3 mg/dL (ref 8.9–10.3)
Chloride: 103 mmol/L (ref 98–111)
Creatinine, Ser: 0.72 mg/dL (ref 0.44–1.00)
GFR, Estimated: >60 mL/min (ref >60)
Glucose, Bld: 109 mg/dL — ABNORMAL HIGH (ref 70–99)
Potassium: 3.5 mmol/L (ref 3.5–5.1)
Sodium: 138 mmol/L (ref 135–145)
Total Bilirubin: 0.5 mg/dL (ref 0.3–1.2)
Total Protein: 6.8 g/dL (ref 6.5–8.1)

## 2020-10-14 MED ORDER — SODIUM CHLORIDE 0.9 % IV SOLN
Freq: Once | INTRAVENOUS | Status: AC
  Filled 2020-10-14: qty 250

## 2020-10-14 MED ORDER — SODIUM CHLORIDE 0.9 % IV SOLN
65.0000 mg/m2 | Freq: Once | INTRAVENOUS | Status: AC
  Administered 2020-10-14: 132 mg via INTRAVENOUS
  Filled 2020-10-14: qty 22

## 2020-10-14 MED ORDER — HEPARIN SOD (PORK) LOCK FLUSH 100 UNIT/ML IV SOLN
500.0000 [IU] | Freq: Once | INTRAVENOUS | Status: AC
Start: 2020-10-14 — End: 2020-10-14
  Administered 2020-10-14: 500 [IU] via INTRAVENOUS
  Filled 2020-10-14: qty 5

## 2020-10-14 MED ORDER — SODIUM CHLORIDE 0.9% FLUSH
10.0000 mL | Freq: Once | INTRAVENOUS | Status: AC
  Administered 2020-10-14: 10 mL via INTRAVENOUS
  Filled 2020-10-14: qty 10

## 2020-10-14 MED ORDER — FAMOTIDINE 20 MG IN NS 100 ML IVPB
20.0000 mg | Freq: Once | INTRAVENOUS | Status: AC
  Administered 2020-10-14: 20 mg via INTRAVENOUS
  Filled 2020-10-14: qty 20

## 2020-10-14 MED ORDER — SODIUM CHLORIDE 0.9 % IV SOLN
20.0000 mg | Freq: Once | INTRAVENOUS | Status: AC
  Administered 2020-10-14: 20 mg via INTRAVENOUS
  Filled 2020-10-14: qty 20

## 2020-10-14 MED ORDER — HEPARIN SOD (PORK) LOCK FLUSH 100 UNIT/ML IV SOLN
INTRAVENOUS | Status: AC
  Filled 2020-10-14: qty 5

## 2020-10-14 MED ORDER — HEPARIN SOD (PORK) LOCK FLUSH 100 UNIT/ML IV SOLN
500.0000 [IU] | Freq: Once | INTRAVENOUS | Status: DC
  Filled 2020-10-14: qty 5

## 2020-10-14 MED ORDER — DIPHENHYDRAMINE HCL 50 MG/ML IJ SOLN
50.0000 mg | Freq: Once | INTRAMUSCULAR | Status: AC
  Administered 2020-10-14: 50 mg via INTRAVENOUS
  Filled 2020-10-14: qty 1

## 2020-10-14 NOTE — Patient Instructions (Signed)
[facility] [place] [facility]  Discharge Instructions: Thank you for choosing [facility] to provide your oncology and hematology care.  If you have a lab appointment with the Cancer Center, please go directly to the Cancer Center and check in at the registration area.  Wear comfortable clothing and clothing appropriate for easy access to any Portacath or PICC line.   We strive to give you quality time with your provider. You may need to reschedule your appointment if you arrive late (15 or more minutes).  Arriving late affects you and other patients whose appointments are after yours.  Also, if you miss three or more appointments without notifying the office, you may be dismissed from the clinic at the provider's discretion.      For prescription refill requests, have your pharmacy contact our office and allow 72 hours for refills to be completed.    Today you received the following chemotherapy and/or immunotherapy agents TAXOL      To help prevent nausea and vomiting after your treatment, we encourage you to take your nausea medication as directed.  BELOW ARE SYMPTOMS THAT SHOULD BE REPORTED IMMEDIATELY: *FEVER GREATER THAN 100.4 F (38 C) OR HIGHER *CHILLS OR SWEATING *NAUSEA AND VOMITING THAT IS NOT CONTROLLED WITH YOUR NAUSEA MEDICATION *UNUSUAL SHORTNESS OF BREATH *UNUSUAL BRUISING OR BLEEDING *URINARY PROBLEMS (pain or burning when urinating, or frequent urination) *BOWEL PROBLEMS (unusual diarrhea, constipation, pain near the anus) TENDERNESS IN MOUTH AND THROAT WITH OR WITHOUT PRESENCE OF ULCERS (sore throat, sores in mouth, or a toothache) UNUSUAL RASH, SWELLING OR PAIN  UNUSUAL VAGINAL DISCHARGE OR ITCHING   Items with * indicate a potential emergency and should be followed up as soon as possible or go to the Emergency Department if any problems should occur.  Please show the CHEMOTHERAPY ALERT CARD or IMMUNOTHERAPY ALERT CARD at check-in to  the Emergency Department and triage nurse.  Should you have questions after your visit or need to cancel or reschedule your appointment, please contact [facility] [place] [facility]  [phone] and follow the prompts.  Office hours are 8:00 a.m. to 4:30 p.m. Monday - Friday. Please note that voicemails left after 4:00 p.m. may not be returned until the following business day.  We are closed weekends and major holidays. You have access to a nurse at all times for urgent questions. Please call the main number to the clinic [phone] and follow the prompts.  For any non-urgent questions, you may also contact your provider using MyChart. We now offer e-Visits for anyone 18 and older to request care online for non-urgent symptoms. For details visit [email or web address].   Also download the MyChart app! Go to the app store, search "MyChart", open the app, select [facility], and log in with your MyChart username and password.  Due to Covid, a mask is required upon entering the hospital/clinic. If you do not have a mask, one will be given to you upon arrival. For doctor visits, patients may have 1 support person aged 18 or older with them. For treatment visits, patients cannot have anyone with them due to current Covid guidelines and our immunocompromised population.   Paclitaxel injection What is this medication? PACLITAXEL (PAK li TAX el) is a chemotherapy drug. It targets fast dividing cells, like cancer cells, and causes these cells to die. This medicine is used to treat ovarian cancer, breast cancer, lung cancer, Kaposi's sarcoma, and other cancers. This medicine may be used for other purposes; ask   your health care provider or pharmacist if you have questions. COMMON BRAND NAME(S): Onxol, Taxol What should I tell my care team before I take this medication? They need to know if you have any of these conditions: history of irregular heartbeat liver disease low blood counts,  like low white cell, platelet, or red cell counts lung or breathing disease, like asthma tingling of the fingers or toes, or other nerve disorder an unusual or allergic reaction to paclitaxel, alcohol, polyoxyethylated castor oil, other chemotherapy, other medicines, foods, dyes, or preservatives pregnant or trying to get pregnant breast-feeding How should I use this medication? This drug is given as an infusion into a vein. It is administered in a hospital or clinic by a specially trained health care professional. Talk to your pediatrician regarding the use of this medicine in children. Special care may be needed. Overdosage: If you think you have taken too much of this medicine contact a poison control center or emergency room at once. NOTE: This medicine is only for you. Do not share this medicine with others. What if I miss a dose? It is important not to miss your dose. Call your doctor or health care professional if you are unable to keep an appointment. What may interact with this medication? Do not take this medicine with any of the following medications: live virus vaccines This medicine may also interact with the following medications: antiviral medicines for hepatitis, HIV or AIDS certain antibiotics like erythromycin and clarithromycin certain medicines for fungal infections like ketoconazole and itraconazole certain medicines for seizures like carbamazepine, phenobarbital, phenytoin gemfibrozil nefazodone rifampin St. John's wort This list may not describe all possible interactions. Give your health care provider a list of all the medicines, herbs, non-prescription drugs, or dietary supplements you use. Also tell them if you smoke, drink alcohol, or use illegal drugs. Some items may interact with your medicine. What should I watch for while using this medication? Your condition will be monitored carefully while you are receiving this medicine. You will need important blood work  done while you are taking this medicine. This medicine can cause serious allergic reactions. To reduce your risk you will need to take other medicine(s) before treatment with this medicine. If you experience allergic reactions like skin rash, itching or hives, swelling of the face, lips, or tongue, tell your doctor or health care professional right away. In some cases, you may be given additional medicines to help with side effects. Follow all directions for their use. This drug may make you feel generally unwell. This is not uncommon, as chemotherapy can affect healthy cells as well as cancer cells. Report any side effects. Continue your course of treatment even though you feel ill unless your doctor tells you to stop. Call your doctor or health care professional for advice if you get a fever, chills or sore throat, or other symptoms of a cold or flu. Do not treat yourself. This drug decreases your body's ability to fight infections. Try to avoid being around people who are sick. This medicine may increase your risk to bruise or bleed. Call your doctor or health care professional if you notice any unusual bleeding. Be careful brushing and flossing your teeth or using a toothpick because you may get an infection or bleed more easily. If you have any dental work done, tell your dentist you are receiving this medicine. Avoid taking products that contain aspirin, acetaminophen, ibuprofen, naproxen, or ketoprofen unless instructed by your doctor. These medicines may hide a   fever. Do not become pregnant while taking this medicine. Women should inform their doctor if they wish to become pregnant or think they might be pregnant. There is a potential for serious side effects to an unborn child. Talk to your health care professional or pharmacist for more information. Do not breast-feed an infant while taking this medicine. Men are advised not to father a child while receiving this medicine. This product may  contain alcohol. Ask your pharmacist or healthcare provider if this medicine contains alcohol. Be sure to tell all healthcare providers you are taking this medicine. Certain medicines, like metronidazole and disulfiram, can cause an unpleasant reaction when taken with alcohol. The reaction includes flushing, headache, nausea, vomiting, sweating, and increased thirst. The reaction can last from 30 minutes to several hours. What side effects may I notice from receiving this medication? Side effects that you should report to your doctor or health care professional as soon as possible: allergic reactions like skin rash, itching or hives, swelling of the face, lips, or tongue breathing problems changes in vision fast, irregular heartbeat high or low blood pressure mouth sores pain, tingling, numbness in the hands or feet signs of decreased platelets or bleeding - bruising, pinpoint red spots on the skin, black, tarry stools, blood in the urine signs of decreased red blood cells - unusually weak or tired, feeling faint or lightheaded, falls signs of infection - fever or chills, cough, sore throat, pain or difficulty passing urine signs and symptoms of liver injury like dark yellow or brown urine; general ill feeling or flu-like symptoms; light-colored stools; loss of appetite; nausea; right upper belly pain; unusually weak or tired; yellowing of the eyes or skin swelling of the ankles, feet, hands unusually slow heartbeat Side effects that usually do not require medical attention (report to your doctor or health care professional if they continue or are bothersome): diarrhea hair loss loss of appetite muscle or joint pain nausea, vomiting pain, redness, or irritation at site where injected tiredness This list may not describe all possible side effects. Call your doctor for medical advice about side effects. You may report side effects to FDA at 1-800-FDA-1088. Where should I keep my  medication? This drug is given in a hospital or clinic and will not be stored at home. NOTE: This sheet is a summary. It may not cover all possible information. If you have questions about this medicine, talk to your doctor, pharmacist, or health care provider.  2022 Elsevier/Gold Standard (2019-02-22 13:37:23)  

## 2020-10-15 ENCOUNTER — Ambulatory Visit: Payer: 59 | Attending: General Surgery

## 2020-10-15 DIAGNOSIS — Z483 Aftercare following surgery for neoplasm: Secondary | ICD-10-CM | POA: Insufficient documentation

## 2020-10-15 NOTE — Therapy (Signed)
[place], [place], [address] Phone: [phone]   Fax:  [phone]  Physical Therapy Treatment  Patient Details  Name: Anna Stewart MRN: [id number] Date of Birth: Jun 03, 1955 Referring Provider (PT): Donne Hazel   Encounter Date: 10/15/2020   PT End of Session - 10/15/20 1210     Visit Number 2   # unchanged due to screen only   PT Start Time 1204    PT Stop Time 1210    PT Time Calculation (min) 6 min    Activity Tolerance Patient tolerated treatment well    Behavior During Therapy [facility] for tasks assessed/performed             Past Medical History:  Diagnosis Date   Allergy    AR (allergic rhinitis)    Arthritis    Family history of brain cancer    Family history of lung cancer    Hair loss    Hypertension    x 5years   Migraine    Psoriasis    Seizure ([place])    x 17years   Seizure disorder ([place])    Vitamin D deficiency     Past Surgical History:  Procedure Laterality Date   BREAST BIOPSY Right    benign ? date-before 2006   BREAST BIOPSY Left 05/30/2020   [place] bx, coil marker, path pending   BREAST LUMPECTOMY WITH RADIOACTIVE SEED AND SENTINEL LYMPH NODE BIOPSY Left 06/24/2020   Procedure: LEFT BREAST LUMPECTOMY WITH RADIOACTIVE SEED AND LEFT AXILLARY SENTINEL LYMPH NODE BIOPSY;  Surgeon: Rolm Bookbinder, MD;  Location: [facility];  Service: General;  Laterality: Left;   CESAREAN SECTION     CHOLECYSTECTOMY     KNEE SURGERY     PORTACATH PLACEMENT Right 06/24/2020   Procedure: INSERTION PORT-A-CATH;  Surgeon: Rolm Bookbinder, MD;  Location: [facility];  Service: General;  Laterality: Right;   TONSILLECTOMY     TUBAL LIGATION      There were no vitals filed for this visit.   Subjective Assessment - 10/15/20 1208     Subjective Pt returns for her 3 month L-Dex screen.    Pertinent History L breast cancer triple negative, 06/24/20 L  lumpectomy and SLNB, then chemo, then radiation,                    L-DEX FLOWSHEETS - 10/15/20 1200       L-DEX LYMPHEDEMA SCREENING   Measurement Type Unilateral    L-DEX MEASUREMENT EXTREMITY Upper Extremity    POSITION  Standing    DOMINANT SIDE Right    At Risk Side Left    BASELINE SCORE (UNILATERAL) 4.4    L-DEX SCORE (UNILATERAL) 2.3    VALUE CHANGE (UNILAT) -2.1                                    PT Long Term Goals - 07/08/20 1537       PT LONG TERM GOAL #1   Title Pt will return to baseline ROM measurements and not demonstrate any signs or symptoms of lymphedema.    Time 6    Period Weeks    Status Achieved                   Plan - 10/15/20 1211     Clinical Impression Statement Pt returns for her 3 month  L-Dex screen. Her change from baseline of -2.1 is WNLs so no further treatment is required at this time except to cont every 3 month L-Dex screens which pt is agreeable to.    PT Next Visit Plan Cont every 3 month L-Dex screens for up to 2 years from SLNB.    Consulted and Agree with Plan of Care Patient             Patient will benefit from skilled therapeutic intervention in order to improve the following deficits and impairments:     Visit Diagnosis: Aftercare following surgery for neoplasm     Problem List Patient Active Problem List   Diagnosis Date Noted   Genetic testing 07/31/2020   Family history of brain cancer    Family history of lung cancer    Malignant neoplasm of lower-outer quadrant of left breast of female, estrogen receptor negative ([place]) 06/19/2020   Osteoarthritis 05/16/2020   High risk medication use 05/18/2019   Vitamin D deficiency 05/18/2019   Foot pain, bilateral 03/24/2018   Acute foot pain, left 03/16/2016   History of depression 03/16/2016   Vaginal atrophy 05/14/2015   Menopause 05/14/2015   Headache, migraine 05/14/2015   Benign essential HTN 10/12/2014   B12  deficiency 08/30/2013   Clinical depression 08/30/2013   Seizure ([place]) 07/10/2013   Dupuytren's contracture of foot 07/10/2013   Other and unspecified ovarian cyst 07/10/2013   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

## 2020-10-21 ENCOUNTER — Inpatient Hospital Stay (HOSPITAL_BASED_OUTPATIENT_CLINIC_OR_DEPARTMENT_OTHER): Payer: 59 | Admitting: Oncology

## 2020-10-21 ENCOUNTER — Inpatient Hospital Stay: Payer: 59

## 2020-10-21 ENCOUNTER — Other Ambulatory Visit: Payer: Self-pay

## 2020-10-21 ENCOUNTER — Encounter: Payer: Self-pay | Admitting: Oncology

## 2020-10-21 VITALS — BP 129/85 | HR 101 | Temp 98.2°F | Resp 16 | Ht 67.0 in | Wt 182.7 lb

## 2020-10-21 VITALS — BP 129/83 | HR 93 | Temp 97.2°F | Resp 18

## 2020-10-21 DIAGNOSIS — C50512 Malignant neoplasm of lower-outer quadrant of left female breast: Secondary | ICD-10-CM

## 2020-10-21 DIAGNOSIS — T451X5A Adverse effect of antineoplastic and immunosuppressive drugs, initial encounter: Secondary | ICD-10-CM | POA: Diagnosis not present

## 2020-10-21 DIAGNOSIS — D701 Agranulocytosis secondary to cancer chemotherapy: Secondary | ICD-10-CM

## 2020-10-21 DIAGNOSIS — Z5111 Encounter for antineoplastic chemotherapy: Secondary | ICD-10-CM | POA: Diagnosis not present

## 2020-10-21 DIAGNOSIS — D6481 Anemia due to antineoplastic chemotherapy: Secondary | ICD-10-CM | POA: Diagnosis not present

## 2020-10-21 DIAGNOSIS — Z171 Estrogen receptor negative status [ER-]: Secondary | ICD-10-CM

## 2020-10-21 LAB — CBC WITH DIFFERENTIAL/PLATELET
Abs Immature Granulocytes: 0 10*3/uL (ref 0.00–0.07)
Band Neutrophils: 0 %
Basophils Absolute: 0.1 10*3/uL (ref 0.0–0.1)
Basophils Relative: 2 %
Blasts: 0 %
Eosinophils Absolute: 0.1 10*3/uL (ref 0.0–0.5)
Eosinophils Relative: 4 %
HCT: 33.4 % — ABNORMAL LOW (ref 36.0–46.0)
Hemoglobin: 10.9 g/dL — ABNORMAL LOW (ref 12.0–15.0)
Lymphocytes Relative: 24 %
Lymphs Abs: 0.7 10*3/uL (ref 0.7–4.0)
MCH: 32.2 pg (ref 26.0–34.0)
MCHC: 32.6 g/dL (ref 30.0–36.0)
MCV: 98.8 fL (ref 80.0–100.0)
Metamyelocytes Relative: 0 %
Monocytes Absolute: 0.4 10*3/uL (ref 0.1–1.0)
Monocytes Relative: 15 %
Myelocytes: 0 %
Neutro Abs: 1.5 10*3/uL — ABNORMAL LOW (ref 1.7–7.7)
Neutrophils Relative %: 55 %
Other: 0 %
Platelets: 305 10*3/uL (ref 150–400)
Promyelocytes Relative: 0 %
RBC: 3.38 MIL/uL — ABNORMAL LOW (ref 3.87–5.11)
RDW: 15 % (ref 11.5–15.5)
Smear Review: ADEQUATE
WBC: 2.8 10*3/uL — ABNORMAL LOW (ref 4.0–10.5)
nRBC: 0 % (ref 0.0–0.2)
nRBC: 0 /100 WBC

## 2020-10-21 LAB — COMPREHENSIVE METABOLIC PANEL
ALT: 25 U/L (ref 0–44)
AST: 22 U/L (ref 15–41)
Albumin: 4.1 g/dL (ref 3.5–5.0)
Alkaline Phosphatase: 66 U/L (ref 38–126)
Anion gap: 8 (ref 5–15)
BUN: 16 mg/dL (ref 8–23)
CO2: 25 mmol/L (ref 22–32)
Calcium: 9.1 mg/dL (ref 8.9–10.3)
Chloride: 104 mmol/L (ref 98–111)
Creatinine, Ser: 0.75 mg/dL (ref 0.44–1.00)
GFR, Estimated: >60 mL/min (ref >60)
Glucose, Bld: 100 mg/dL — ABNORMAL HIGH (ref 70–99)
Potassium: 3.7 mmol/L (ref 3.5–5.1)
Sodium: 137 mmol/L (ref 135–145)
Total Bilirubin: 0.4 mg/dL (ref 0.3–1.2)
Total Protein: 6.6 g/dL (ref 6.5–8.1)

## 2020-10-21 MED ORDER — DIPHENHYDRAMINE HCL 50 MG/ML IJ SOLN
50.0000 mg | Freq: Once | INTRAMUSCULAR | Status: AC
  Administered 2020-10-21: 50 mg via INTRAVENOUS
  Filled 2020-10-21: qty 1

## 2020-10-21 MED ORDER — SODIUM CHLORIDE 0.9 % IV SOLN
20.0000 mg | Freq: Once | INTRAVENOUS | Status: AC
  Administered 2020-10-21: 20 mg via INTRAVENOUS
  Filled 2020-10-21: qty 20

## 2020-10-21 MED ORDER — SODIUM CHLORIDE 0.9% FLUSH
10.0000 mL | Freq: Once | INTRAVENOUS | Status: AC
  Administered 2020-10-21: 10 mL via INTRAVENOUS
  Filled 2020-10-21: qty 10

## 2020-10-21 MED ORDER — HEPARIN SOD (PORK) LOCK FLUSH 100 UNIT/ML IV SOLN
500.0000 [IU] | Freq: Once | INTRAVENOUS | Status: AC
  Administered 2020-10-21: 500 [IU] via INTRAVENOUS
  Filled 2020-10-21: qty 5

## 2020-10-21 MED ORDER — FAMOTIDINE 20 MG IN NS 100 ML IVPB
20.0000 mg | Freq: Once | INTRAVENOUS | Status: AC
  Administered 2020-10-21: 20 mg via INTRAVENOUS
  Filled 2020-10-21: qty 20

## 2020-10-21 MED ORDER — SODIUM CHLORIDE 0.9 % IV SOLN
Freq: Once | INTRAVENOUS | Status: AC
  Filled 2020-10-21: qty 250

## 2020-10-21 MED ORDER — SODIUM CHLORIDE 0.9 % IV SOLN
65.0000 mg/m2 | Freq: Once | INTRAVENOUS | Status: AC
  Administered 2020-10-21: 132 mg via INTRAVENOUS
  Filled 2020-10-21: qty 22

## 2020-10-21 NOTE — Patient Instructions (Signed)
[facility] CENTER [place] [facility]  Discharge Instructions: Thank you for choosing [facility] to provide your oncology and hematology care.  If you have a lab appointment with the Cancer Center, please go directly to the Cancer Center and check in at the registration area.  Wear comfortable clothing and clothing appropriate for easy access to any Portacath or PICC line.   We strive to give you quality time with your provider. You may need to reschedule your appointment if you arrive late (15 or more minutes).  Arriving late affects you and other patients whose appointments are after yours.  Also, if you miss three or more appointments without notifying the office, you may be dismissed from the clinic at the provider's discretion.      For prescription refill requests, have your pharmacy contact our office and allow 72 hours for refills to be completed.      To help prevent nausea and vomiting after your treatment, we encourage you to take your nausea medication as directed.  BELOW ARE SYMPTOMS THAT SHOULD BE REPORTED IMMEDIATELY: *FEVER GREATER THAN 100.4 F (38 C) OR HIGHER *CHILLS OR SWEATING *NAUSEA AND VOMITING THAT IS NOT CONTROLLED WITH YOUR NAUSEA MEDICATION *UNUSUAL SHORTNESS OF BREATH *UNUSUAL BRUISING OR BLEEDING *URINARY PROBLEMS (pain or burning when urinating, or frequent urination) *BOWEL PROBLEMS (unusual diarrhea, constipation, pain near the anus) TENDERNESS IN MOUTH AND THROAT WITH OR WITHOUT PRESENCE OF ULCERS (sore throat, sores in mouth, or a toothache) UNUSUAL RASH, SWELLING OR PAIN  UNUSUAL VAGINAL DISCHARGE OR ITCHING   Items with * indicate a potential emergency and should be followed up as soon as possible or go to the Emergency Department if any problems should occur.  Please show the CHEMOTHERAPY ALERT CARD or IMMUNOTHERAPY ALERT CARD at check-in to the Emergency Department and triage nurse.  Should you have questions after your  visit or need to cancel or reschedule your appointment, please contact [facility]  [phone] and follow the prompts.  Office hours are 8:00 a.m. to 4:30 p.m. Monday - Friday. Please note that voicemails left after 4:00 p.m. may not be returned until the following business day.  We are closed weekends and major holidays. You have access to a nurse at all times for urgent questions. Please call the main number to the clinic [phone] and follow the prompts.  For any non-urgent questions, you may also contact your provider using MyChart. We now offer e-Visits for anyone 18 and older to request care online for non-urgent symptoms. For details visit [email or web address].   Also download the MyChart app! Go to the app store, search "MyChart", open the app, select [facility], and log in with your MyChart username and password.  Due to Covid, a mask is required upon entering the hospital/clinic. If you do not have a mask, one will be given to you upon arrival. For doctor visits, patients may have 1 support person aged 18 or older with them. For treatment visits, patients cannot have anyone with them due to current Covid guidelines and our immunocompromised population.  

## 2020-10-21 NOTE — Progress Notes (Signed)
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  This was followed by diagnostic mammogram and ultrasound which showed a 7 x 7 x 5 mm hypoechoic mass in the 7 o'clock position of the left breast 8 cm from the nipple.  Ultrasound of the left axilla demonstrates 1 normal-appearing left axillary lymph nodes including 1 with borderline diffuse cortical thickening measuring 3.8 mm in maximum thickness.  No  Lymph nodes with focal or eccentric cortical thickening were seen.  The suspicious breast mass was biopsied and was consistent with triple negative grade 3 invasive mammary carcinoma.   1 cm invasive mammary carcinoma that was grade 2 with negative margins.  1 sentinel lymph node was negative for malignancy.  Patient underwent lumpectomy with sentinel lymph node biopsy.  Final pathology showed 1.1 cm grade 2 invasive mammary carcinoma with negative margins.  1 sentinel lymph node was negative for malignancy.  PT1c  pN 0.  Plan is for adjuvant dose dense AC chemotherapy along with 12 weekly cycles of Taxol    Interval history-patient is tolerating chemotherapy well without any significant side effects.  She has baseline neuropathy even prior to starting chemotherapy which is essentially stable.  She uses cold mittens and socks  during chemotherapy denies any new complaints at this time.  She does have chronic joint pains from arthritis.  ECOG PS- 1 Pain scale- 0  Review of systems- Review of Systems  Constitutional:  Positive for malaise/fatigue. Negative for chills, fever and weight loss.  HENT:  Negative for congestion, ear discharge and nosebleeds.   Eyes:  Negative for blurred vision.  Respiratory:  Negative for cough, hemoptysis, sputum production, shortness of breath and wheezing.   Cardiovascular:  Negative for chest pain, palpitations, orthopnea and claudication.  Gastrointestinal:  Negative for abdominal pain, blood in stool, constipation, diarrhea, heartburn, melena, nausea and vomiting.  Genitourinary:  Negative for dysuria, flank pain, frequency, hematuria and urgency.  Musculoskeletal:  Positive for joint pain. Negative for back pain and myalgias.  Skin:  Negative for rash.  Neurological:  Positive for sensory change (Peripheral neuropathy). Negative for dizziness, tingling, focal weakness, seizures, weakness and headaches.  Endo/Heme/Allergies:  Does not bruise/bleed easily.  Psychiatric/Behavioral:  Negative for depression and suicidal ideas. The patient does not have insomnia.       Allergies  Allergen Reactions   Keppra [Levetiracetam]    Tegretol [Carbamazepine]      Past Medical History:  Diagnosis Date   Allergy    AR (allergic rhinitis)    Arthritis    Family history of brain cancer    Family history of lung cancer    Hair loss    Hypertension    x 5years   Migraine    Psoriasis    Seizure ([place])    x 17years   Seizure disorder (HCC)    Vitamin D deficiency  Past Surgical History:  Procedure Laterality Date   BREAST BIOPSY Right    benign ? date-before 2006   BREAST BIOPSY Left 05/30/2020   [place] bx, coil marker, path pending   BREAST LUMPECTOMY WITH RADIOACTIVE SEED AND SENTINEL LYMPH NODE BIOPSY Left 06/24/2020   Procedure: LEFT BREAST LUMPECTOMY WITH RADIOACTIVE  SEED AND LEFT AXILLARY SENTINEL LYMPH NODE BIOPSY;  Surgeon: Rolm Bookbinder, MD;  Location: [place];  Service: General;  Laterality: Left;   CESAREAN SECTION     CHOLECYSTECTOMY     KNEE SURGERY     PORTACATH PLACEMENT Right 06/24/2020   Procedure: INSERTION PORT-A-CATH;  Surgeon: Rolm Bookbinder, MD;  Location: [place];  Service: General;  Laterality: Right;   TONSILLECTOMY     TUBAL LIGATION      Social History   Socioeconomic History   Marital status: Married    Spouse name: Event organiser   Number of children: 3   Years of education: College   Highest education level: Not on file  Occupational History   Occupation:      Employer: OTHER    Comment: [employer]  Tobacco Use   Smoking status: Never   Smokeless tobacco: Never  Vaping Use   Vaping Use: Never used  Substance and Sexual Activity   Alcohol use: Yes    Comment: Rarely- wine   Drug use: No   Sexual activity: Yes    Birth control/protection: Post-menopausal  Other Topics Concern   Not on file  Social History Narrative   Patient lives at home with her family.   Caffeine Use: 1 cups of coffee daily   Social Determinants of Health   Financial Resource Strain: Not on file  Food Insecurity: Not on file  Transportation Needs: Not on file  Physical Activity: Not on file  Stress: Not on file  Social Connections: Not on file  Intimate Partner Violence: Not on file    Family History  Problem Relation Age of Onset   Brain cancer Mother 1       tumor   Diabetes Father    Heart disease Father    Lung cancer Father    Cancer Paternal Aunt    Diabetes Maternal Grandmother    Stroke Maternal Grandfather    Breast cancer Neg Hx      Current Outpatient Medications:    fluticasone (FLONASE) 50 MCG/ACT nasal spray, Place 2 sprays into both nostrils daily., Disp: , Rfl:    LAMICTAL XR 200 MG TB24, Take 1 tablet (200 mg total) by mouth 2 (two) times daily. Brand  Medically Necessary, Disp: 180 tablet, Rfl: 3   lidocaine-prilocaine (EMLA) cream, Apply to affected area once, Disp: 30 g, Rfl: 3   lisinopril-hydrochlorothiazide (ZESTORETIC) 10-12.5 MG tablet, Take 1 tablet by mouth daily. Pt states 1/2 tablet a day, Disp: , Rfl:    loratadine (CLARITIN) 10 MG tablet, Take 10 mg by mouth daily., Disp: , Rfl:    pantoprazole (PROTONIX) 20 MG tablet, Take 20 mg by mouth daily., Disp: , Rfl:    traMADol (ULTRAM) 50 MG tablet, Take 1 tablet (50 mg total) by mouth every 6 (six) hours as needed., Disp: 30 tablet, Rfl: 0   cetirizine (ZYRTEC) 10 MG tablet, Take 10 mg by mouth daily. (Patient not taking: Reported on 10/21/2020), Disp: , Rfl:    LORazepam (ATIVAN) 0.5 MG tablet, Take 1 tablet (0.5 mg total) by mouth every 6 (six) hours as needed (Nausea or vomiting). (Patient not  taking: No sig reported), Disp: 30 tablet, Rfl: 0   Multiple Vitamin (MULTI-VITAMINS) TABS, Take 1 tablet by mouth daily.  (Patient not taking: No sig reported), Disp: , Rfl:    ondansetron (ZOFRAN) 8 MG tablet, Take 1 tablet (8 mg total) by mouth 2 (two) times daily as needed. Start on the third day after chemotherapy. (Patient not taking: No sig reported), Disp: 30 tablet, Rfl: 1   oxyCODONE (OXY IR/ROXICODONE) 5 MG immediate release tablet, Take 1 tablet (5 mg total) by mouth every 6 (six) hours as needed. (Patient not taking: No sig reported), Disp: 30 tablet, Rfl: 0   prochlorperazine (COMPAZINE) 10 MG tablet, TAKE 1 TABLET(10 MG) BY MOUTH EVERY 6 HOURS AS NEEDED FOR NAUSEA OR VOMITING (Patient not taking: No sig reported), Disp: 30 tablet, Rfl: 1   traZODone (DESYREL) 50 MG tablet, TAKE 1 TABLET(50 MG) BY MOUTH AT BEDTIME (Patient not taking: No sig reported), Disp: 30 tablet, Rfl: 1 No current facility-administered medications for this visit.  Facility-Administered Medications Ordered in Other Visits:    heparin lock flush 100 unit/mL, 500 Units, Intravenous, Once, Sindy Guadeloupe, MD    PACLitaxel (TAXOL) 132 mg in sodium chloride 0.9 % 250 mL chemo infusion (</= 71m/m2), 65 mg/m2 (Treatment Plan Recorded), Intravenous, Once, RSindy Guadeloupe MD, Last Rate: 272 mL/hr at 10/21/20 1222, 132 mg at 10/21/20 1222  Physical exam:  Vitals:   10/21/20 1103  BP: 129/85  Pulse: (!) 101  Resp: 16  Temp: 98.2 F (36.8 C)  TempSrc: Oral  Weight: 182 lb 11.2 oz (82.9 kg)  Height: '5\' 7"'  (1.702 m)   Physical Exam Cardiovascular:     Rate and Rhythm: Normal rate and regular rhythm.     Heart sounds: Normal heart sounds.  Pulmonary:     Effort: Pulmonary effort is normal.  Skin:    General: Skin is warm and dry.  Neurological:     Mental Status: She is alert and oriented to person, place, and time.     CMP Latest Ref Rng & Units 10/21/2020  Glucose 70 - 99 mg/dL 100(H)  BUN 8 - 23 mg/dL 16  Creatinine 0.44 - 1.00 mg/dL 0.75  Sodium 135 - 145 mmol/L 137  Potassium 3.5 - 5.1 mmol/L 3.7  Chloride 98 - 111 mmol/L 104  CO2 22 - 32 mmol/L 25  Calcium 8.9 - 10.3 mg/dL 9.1  Total Protein 6.5 - 8.1 g/dL 6.6  Total Bilirubin 0.3 - 1.2 mg/dL 0.4  Alkaline Phos 38 - 126 U/L 66  AST 15 - 41 U/L 22  ALT 0 - 44 U/L 25   CBC Latest Ref Rng & Units 10/21/2020  WBC 4.0 - 10.5 K/uL 2.8(L)  Hemoglobin 12.0 - 15.0 g/dL 10.9(L)  Hematocrit 36.0 - 46.0 % 33.4(L)  Platelets 150 - 400 K/uL 305     Assessment and plan- Patient is a 65y.o. female with pathological prognostic stage Ib invasive mammary carcinoma triple negative pT1 cpN0 cM0 s/p lumpectomy and sentinel lymph node biopsy.  She is here for on treatment assessment prior to cycle 6 of weekly Taxol chemotherapy  Patient's white cell count and neutrophil count have been gradually trending down on her weekly Taxol.Her ANC is down to 1.5 today as compared to 1.7 prior.  She will continue to receive chemotherapy this week but I will consider adding Neupogen to her next cycle plan is to complete 12 weekly cycles of Taxol  Chemo induced  anemia: Anemia work-up was essentially  unremarkable.  Hemoglobin is stable around 10-11.Continue to monitor  Taxol in 1 week in 2 weeks and I will see her back in 2 weeks  Peripheral neuropathy: She has had this even prior to chemotherapy and is overall stable.  Continue to monitor   Visit Diagnosis 1. Chemotherapy induced neutropenia (HCC)   2. Malignant neoplasm of lower-outer quadrant of left breast of female, estrogen receptor negative ([place])   3. Antineoplastic chemotherapy induced anemia      Dr. [name], MD, MPH [facility] at [facility] [phone] 10/21/2020 1:15 PM

## 2020-10-21 NOTE — Progress Notes (Signed)
Pt has no concerns from chemo. Husband has a question about her wbc counts and should be having onpro neulasta. I told him that we generally do not need that for taxol. Sometimes if needed pt could have short acting inj. To help and sometimes a week off and it varies with each pt.

## 2020-10-28 ENCOUNTER — Inpatient Hospital Stay: Payer: 59

## 2020-10-28 ENCOUNTER — Other Ambulatory Visit: Payer: Self-pay

## 2020-10-28 VITALS — BP 128/77 | HR 96 | Temp 96.1°F | Resp 18 | Wt 184.4 lb

## 2020-10-28 DIAGNOSIS — Z5111 Encounter for antineoplastic chemotherapy: Secondary | ICD-10-CM | POA: Diagnosis not present

## 2020-10-28 DIAGNOSIS — Z171 Estrogen receptor negative status [ER-]: Secondary | ICD-10-CM

## 2020-10-28 DIAGNOSIS — C50512 Malignant neoplasm of lower-outer quadrant of left female breast: Secondary | ICD-10-CM

## 2020-10-28 LAB — COMPREHENSIVE METABOLIC PANEL
ALT: 26 U/L (ref 0–44)
AST: 24 U/L (ref 15–41)
Albumin: 4.1 g/dL (ref 3.5–5.0)
Alkaline Phosphatase: 65 U/L (ref 38–126)
Anion gap: 9 (ref 5–15)
BUN: 10 mg/dL (ref 8–23)
CO2: 25 mmol/L (ref 22–32)
Calcium: 9.1 mg/dL (ref 8.9–10.3)
Chloride: 103 mmol/L (ref 98–111)
Creatinine, Ser: 0.73 mg/dL (ref 0.44–1.00)
GFR, Estimated: >60 mL/min (ref >60)
Glucose, Bld: 107 mg/dL — ABNORMAL HIGH (ref 70–99)
Potassium: 3.6 mmol/L (ref 3.5–5.1)
Sodium: 137 mmol/L (ref 135–145)
Total Bilirubin: 0.2 mg/dL — ABNORMAL LOW (ref 0.3–1.2)
Total Protein: 6.5 g/dL (ref 6.5–8.1)

## 2020-10-28 LAB — CBC WITH DIFFERENTIAL/PLATELET
Abs Immature Granulocytes: 0.02 10*3/uL (ref 0.00–0.07)
Basophils Absolute: 0.1 10*3/uL (ref 0.0–0.1)
Basophils Relative: 2 %
Eosinophils Absolute: 0.1 10*3/uL (ref 0.0–0.5)
Eosinophils Relative: 4 %
HCT: 32.5 % — ABNORMAL LOW (ref 36.0–46.0)
Hemoglobin: 10.8 g/dL — ABNORMAL LOW (ref 12.0–15.0)
Immature Granulocytes: 1 %
Lymphocytes Relative: 24 %
Lymphs Abs: 0.7 10*3/uL (ref 0.7–4.0)
MCH: 33 pg (ref 26.0–34.0)
MCHC: 33.2 g/dL (ref 30.0–36.0)
MCV: 99.4 fL (ref 80.0–100.0)
Monocytes Absolute: 0.5 10*3/uL (ref 0.1–1.0)
Monocytes Relative: 18 %
Neutro Abs: 1.5 10*3/uL — ABNORMAL LOW (ref 1.7–7.7)
Neutrophils Relative %: 51 %
Platelets: 300 10*3/uL (ref 150–400)
RBC: 3.27 MIL/uL — ABNORMAL LOW (ref 3.87–5.11)
RDW: 14.5 % (ref 11.5–15.5)
WBC: 3 10*3/uL — ABNORMAL LOW (ref 4.0–10.5)
nRBC: 0.7 % — ABNORMAL HIGH (ref 0.0–0.2)

## 2020-10-28 MED ORDER — SODIUM CHLORIDE 0.9% FLUSH
10.0000 mL | INTRAVENOUS | Status: DC | PRN
  Administered 2020-10-28: 10 mL via INTRAVENOUS
  Filled 2020-10-28: qty 10

## 2020-10-28 MED ORDER — HEPARIN SOD (PORK) LOCK FLUSH 100 UNIT/ML IV SOLN
INTRAVENOUS | Status: AC
  Filled 2020-10-28: qty 5

## 2020-10-28 MED ORDER — HEPARIN SOD (PORK) LOCK FLUSH 100 UNIT/ML IV SOLN
500.0000 [IU] | Freq: Once | INTRAVENOUS | Status: AC
  Administered 2020-10-28: 500 [IU] via INTRAVENOUS
  Filled 2020-10-28: qty 5

## 2020-10-28 MED ORDER — SODIUM CHLORIDE 0.9 % IV SOLN
Freq: Once | INTRAVENOUS | Status: AC
  Filled 2020-10-28: qty 250

## 2020-10-28 MED ORDER — SODIUM CHLORIDE 0.9 % IV SOLN
65.0000 mg/m2 | Freq: Once | INTRAVENOUS | Status: AC
  Administered 2020-10-28: 132 mg via INTRAVENOUS
  Filled 2020-10-28: qty 22

## 2020-10-28 MED ORDER — FAMOTIDINE 20 MG IN NS 100 ML IVPB
20.0000 mg | Freq: Once | INTRAVENOUS | Status: AC
  Administered 2020-10-28: 20 mg via INTRAVENOUS
  Filled 2020-10-28: qty 20

## 2020-10-28 MED ORDER — SODIUM CHLORIDE 0.9 % IV SOLN
20.0000 mg | Freq: Once | INTRAVENOUS | Status: AC
  Administered 2020-10-28: 20 mg via INTRAVENOUS
  Filled 2020-10-28: qty 20

## 2020-10-28 MED ORDER — DIPHENHYDRAMINE HCL 50 MG/ML IJ SOLN
50.0000 mg | Freq: Once | INTRAMUSCULAR | Status: AC
  Administered 2020-10-28: 50 mg via INTRAVENOUS
  Filled 2020-10-28: qty 1

## 2020-10-28 NOTE — Patient Instructions (Signed)
[facility] [place] [facility]  Discharge Instructions: Thank you for choosing [facility] to provide your oncology and hematology care.  If you have a lab appointment with the Cancer Center, please go directly to the Cancer Center and check in at the registration area.  Wear comfortable clothing and clothing appropriate for easy access to any Portacath or PICC line.   We strive to give you quality time with your provider. You may need to reschedule your appointment if you arrive late (15 or more minutes).  Arriving late affects you and other patients whose appointments are after yours.  Also, if you miss three or more appointments without notifying the office, you may be dismissed from the clinic at the provider's discretion.      For prescription refill requests, have your pharmacy contact our office and allow 72 hours for refills to be completed.    Today you received the following chemotherapy and/or immunotherapy agents Taxol        To help prevent nausea and vomiting after your treatment, we encourage you to take your nausea medication as directed.  BELOW ARE SYMPTOMS THAT SHOULD BE REPORTED IMMEDIATELY: *FEVER GREATER THAN 100.4 F (38 C) OR HIGHER *CHILLS OR SWEATING *NAUSEA AND VOMITING THAT IS NOT CONTROLLED WITH YOUR NAUSEA MEDICATION *UNUSUAL SHORTNESS OF BREATH *UNUSUAL BRUISING OR BLEEDING *URINARY PROBLEMS (pain or burning when urinating, or frequent urination) *BOWEL PROBLEMS (unusual diarrhea, constipation, pain near the anus) TENDERNESS IN MOUTH AND THROAT WITH OR WITHOUT PRESENCE OF ULCERS (sore throat, sores in mouth, or a toothache) UNUSUAL RASH, SWELLING OR PAIN  UNUSUAL VAGINAL DISCHARGE OR ITCHING   Items with * indicate a potential emergency and should be followed up as soon as possible or go to the Emergency Department if any problems should occur.  Please show the CHEMOTHERAPY ALERT CARD or IMMUNOTHERAPY ALERT CARD at check-in to  the Emergency Department and triage nurse.  Should you have questions after your visit or need to cancel or reschedule your appointment, please contact [facility] [place] [facility]  [phone] and follow the prompts.  Office hours are 8:00 a.m. to 4:30 p.m. Monday - Friday. Please note that voicemails left after 4:00 p.m. may not be returned until the following business day.  We are closed weekends and major holidays. You have access to a nurse at all times for urgent questions. Please call the main number to the clinic [phone] and follow the prompts.  For any non-urgent questions, you may also contact your provider using MyChart. We now offer e-Visits for anyone 18 and older to request care online for non-urgent symptoms. For details visit [email or web address].   Also download the MyChart app! Go to the app store, search "MyChart", open the app, select [place], and log in with your MyChart username and password.  Due to Covid, a mask is required upon entering the hospital/clinic. If you do not have a mask, one will be given to you upon arrival. For doctor visits, patients may have 1 support person aged 18 or older with them. For treatment visits, patients cannot have anyone with them due to current Covid guidelines and our immunocompromised population.  

## 2020-10-29 ENCOUNTER — Inpatient Hospital Stay: Payer: 59

## 2020-10-29 ENCOUNTER — Telehealth: Payer: Self-pay | Admitting: *Deleted

## 2020-10-29 NOTE — Telephone Encounter (Signed)
Pt notified that since her neutrophils has been 1.5  2 times in a row so she would like to hold the neupogen today or the other days this week We will put it on for next cycle for possible inj. . Pt understands and agreeable for this

## 2020-10-30 ENCOUNTER — Inpatient Hospital Stay: Payer: 59

## 2020-10-31 ENCOUNTER — Inpatient Hospital Stay: Payer: 59

## 2020-11-04 ENCOUNTER — Other Ambulatory Visit: Payer: Self-pay

## 2020-11-04 ENCOUNTER — Inpatient Hospital Stay (HOSPITAL_BASED_OUTPATIENT_CLINIC_OR_DEPARTMENT_OTHER): Payer: 59 | Admitting: Oncology

## 2020-11-04 ENCOUNTER — Encounter: Payer: Self-pay | Admitting: Oncology

## 2020-11-04 ENCOUNTER — Inpatient Hospital Stay: Payer: 59

## 2020-11-04 ENCOUNTER — Inpatient Hospital Stay: Payer: 59 | Attending: Oncology

## 2020-11-04 VITALS — HR 100

## 2020-11-04 VITALS — BP 121/79 | HR 102 | Temp 98.2°F | Resp 16 | Ht 67.0 in | Wt 185.6 lb

## 2020-11-04 DIAGNOSIS — Z171 Estrogen receptor negative status [ER-]: Secondary | ICD-10-CM

## 2020-11-04 DIAGNOSIS — I10 Essential (primary) hypertension: Secondary | ICD-10-CM | POA: Diagnosis not present

## 2020-11-04 DIAGNOSIS — C50512 Malignant neoplasm of lower-outer quadrant of left female breast: Secondary | ICD-10-CM | POA: Insufficient documentation

## 2020-11-04 DIAGNOSIS — Z7189 Other specified counseling: Secondary | ICD-10-CM

## 2020-11-04 DIAGNOSIS — T451X5A Adverse effect of antineoplastic and immunosuppressive drugs, initial encounter: Secondary | ICD-10-CM

## 2020-11-04 DIAGNOSIS — D6481 Anemia due to antineoplastic chemotherapy: Secondary | ICD-10-CM

## 2020-11-04 DIAGNOSIS — Z808 Family history of malignant neoplasm of other organs or systems: Secondary | ICD-10-CM | POA: Insufficient documentation

## 2020-11-04 DIAGNOSIS — Z801 Family history of malignant neoplasm of trachea, bronchus and lung: Secondary | ICD-10-CM | POA: Insufficient documentation

## 2020-11-04 DIAGNOSIS — Z5111 Encounter for antineoplastic chemotherapy: Secondary | ICD-10-CM

## 2020-11-04 LAB — CBC WITH DIFFERENTIAL/PLATELET
Abs Immature Granulocytes: 0.05 10*3/uL (ref 0.00–0.07)
Basophils Absolute: 0.1 10*3/uL (ref 0.0–0.1)
Basophils Relative: 2 %
Eosinophils Absolute: 0.1 10*3/uL (ref 0.0–0.5)
Eosinophils Relative: 3 %
HCT: 33.3 % — ABNORMAL LOW (ref 36.0–46.0)
Hemoglobin: 10.9 g/dL — ABNORMAL LOW (ref 12.0–15.0)
Immature Granulocytes: 1 %
Lymphocytes Relative: 18 %
Lymphs Abs: 0.7 10*3/uL (ref 0.7–4.0)
MCH: 32.5 pg (ref 26.0–34.0)
MCHC: 32.7 g/dL (ref 30.0–36.0)
MCV: 99.4 fL (ref 80.0–100.0)
Monocytes Absolute: 0.6 10*3/uL (ref 0.1–1.0)
Monocytes Relative: 16 %
Neutro Abs: 2.3 10*3/uL (ref 1.7–7.7)
Neutrophils Relative %: 60 %
Platelets: 309 10*3/uL (ref 150–400)
RBC: 3.35 MIL/uL — ABNORMAL LOW (ref 3.87–5.11)
RDW: 14.5 % (ref 11.5–15.5)
WBC: 3.9 10*3/uL — ABNORMAL LOW (ref 4.0–10.5)
nRBC: 0.5 % — ABNORMAL HIGH (ref 0.0–0.2)

## 2020-11-04 LAB — COMPREHENSIVE METABOLIC PANEL
ALT: 23 U/L (ref 0–44)
AST: 21 U/L (ref 15–41)
Albumin: 4 g/dL (ref 3.5–5.0)
Alkaline Phosphatase: 66 U/L (ref 38–126)
Anion gap: 9 (ref 5–15)
BUN: 15 mg/dL (ref 8–23)
CO2: 23 mmol/L (ref 22–32)
Calcium: 9 mg/dL (ref 8.9–10.3)
Chloride: 105 mmol/L (ref 98–111)
Creatinine, Ser: 0.64 mg/dL (ref 0.44–1.00)
GFR, Estimated: >60 mL/min (ref >60)
Glucose, Bld: 106 mg/dL — ABNORMAL HIGH (ref 70–99)
Potassium: 3.9 mmol/L (ref 3.5–5.1)
Sodium: 137 mmol/L (ref 135–145)
Total Bilirubin: 0.3 mg/dL (ref 0.3–1.2)
Total Protein: 6.6 g/dL (ref 6.5–8.1)

## 2020-11-04 MED ORDER — SODIUM CHLORIDE 0.9 % IV SOLN
20.0000 mg | Freq: Once | INTRAVENOUS | Status: AC
  Administered 2020-11-04: 20 mg via INTRAVENOUS
  Filled 2020-11-04: qty 2

## 2020-11-04 MED ORDER — SODIUM CHLORIDE 0.9% FLUSH
10.0000 mL | Freq: Once | INTRAVENOUS | Status: AC
  Administered 2020-11-04: 10 mL via INTRAVENOUS
  Filled 2020-11-04: qty 10

## 2020-11-04 MED ORDER — SODIUM CHLORIDE 0.9 % IV SOLN
65.0000 mg/m2 | Freq: Once | INTRAVENOUS | Status: AC
  Administered 2020-11-04: 132 mg via INTRAVENOUS
  Filled 2020-11-04: qty 22

## 2020-11-04 MED ORDER — HEPARIN SOD (PORK) LOCK FLUSH 100 UNIT/ML IV SOLN
500.0000 [IU] | Freq: Once | INTRAVENOUS | Status: AC
  Administered 2020-11-04: 500 [IU] via INTRAVENOUS
  Filled 2020-11-04: qty 5

## 2020-11-04 MED ORDER — DIPHENHYDRAMINE HCL 50 MG/ML IJ SOLN
50.0000 mg | Freq: Once | INTRAMUSCULAR | Status: AC
  Administered 2020-11-04: 50 mg via INTRAVENOUS
  Filled 2020-11-04: qty 1

## 2020-11-04 MED ORDER — HEPARIN SOD (PORK) LOCK FLUSH 100 UNIT/ML IV SOLN
INTRAVENOUS | Status: AC
  Filled 2020-11-04: qty 5

## 2020-11-04 MED ORDER — SODIUM CHLORIDE 0.9 % IV SOLN
Freq: Once | INTRAVENOUS | Status: AC
  Filled 2020-11-04: qty 250

## 2020-11-04 MED ORDER — FAMOTIDINE 20 MG IN NS 100 ML IVPB
20.0000 mg | Freq: Once | INTRAVENOUS | Status: AC
  Administered 2020-11-04: 20 mg via INTRAVENOUS
  Filled 2020-11-04: qty 20

## 2020-11-04 NOTE — Progress Notes (Signed)
Pt states that she feels hair coming back. She is wondering if it is going to fall out again. She has questions about flying and if she needs neupogen for the flight for 11/16/2020

## 2020-11-04 NOTE — Progress Notes (Signed)
Pt received taxol infusion in clinic today. Tolerated well. No complaints at d/c. °

## 2020-11-04 NOTE — Patient Instructions (Signed)
[facility]   Discharge Instructions: Thank you for choosing [facility] to provide your oncology and hematology care.  If you have a lab appointment with the [facility], please go directly to the [facility] and check in at the registration area.  Wear comfortable clothing and clothing appropriate for easy access to any Portacath or PICC line.   We strive to give you quality time with your provider. You may need to reschedule your appointment if you arrive late (15 or more minutes).  Arriving late affects you and other patients whose appointments are after yours.  Also, if you miss three or more appointments without notifying the office, you may be dismissed from the clinic at the provider's discretion.      For prescription refill requests, have your pharmacy contact our office and allow 72 hours for refills to be completed.    Today you received the following chemotherapy and/or immunotherapy agents - Taxol      To help prevent nausea and vomiting after your treatment, we encourage you to take your nausea medication as directed.  BELOW ARE SYMPTOMS THAT SHOULD BE REPORTED IMMEDIATELY: *FEVER GREATER THAN 100.4 F (38 C) OR HIGHER *CHILLS OR SWEATING *NAUSEA AND VOMITING THAT IS NOT CONTROLLED WITH YOUR NAUSEA MEDICATION *UNUSUAL SHORTNESS OF BREATH *UNUSUAL BRUISING OR BLEEDING *URINARY PROBLEMS (pain or burning when urinating, or frequent urination) *BOWEL PROBLEMS (unusual diarrhea, constipation, pain near the anus) TENDERNESS IN MOUTH AND THROAT WITH OR WITHOUT PRESENCE OF ULCERS (sore throat, sores in mouth, or a toothache) UNUSUAL RASH, SWELLING OR PAIN  UNUSUAL VAGINAL DISCHARGE OR ITCHING   Items with * indicate a potential emergency and should be followed up as soon as possible or go to the Emergency Department if any problems should occur.  Please show the CHEMOTHERAPY ALERT CARD or IMMUNOTHERAPY ALERT CARD at check-in  to the Emergency Department and triage nurse.  Should you have questions after your visit or need to cancel or reschedule your appointment, please contact [facility]  [phone] and follow the prompts.  Office hours are 8:00 a.m. to 4:30 p.m. Monday - Friday. Please note that voicemails left after 4:00 p.m. may not be returned until the following business day.  We are closed weekends and major holidays. You have access to a nurse at all times for urgent questions. Please call the main number to the clinic [phone] and follow the prompts.  For any non-urgent questions, you may also contact your provider using MyChart. We now offer e-Visits for anyone 66 and older to request care online for non-urgent symptoms. For details visit [email or web address].   Also download the MyChart app! Go to the app store, search "MyChart", open the app, select [facility], and log in with your MyChart username and password.  Due to Covid, a mask is required upon entering the hospital/clinic. If you do not have a mask, one will be given to you upon arrival. For doctor visits, patients may have 1 support person aged 35 or older with them. For treatment visits, patients cannot have anyone with them due to current Covid guidelines and our immunocompromised population.   Famotidine Solution for Injection What is this medication? FAMOTIDINE (fa MOE ti deen) treats stomach ulcers, reflux disease, or other conditions that cause too much stomach acid. It works by reducing the amount ofacid in the stomach. This medicine may be used for other purposes; ask your health care provider orpharmacist if you  have questions. COMMON BRAND NAME(S): Pepcid What should I tell my care team before I take this medication? They need to know if you have any of these conditions: Kidney or liver disease An unusual or allergic reaction to famotidine, other medications, foods, dyes, or  preservatives Pregnant or trying to get pregnant Breast-feeding How should I use this medication? This medication is for infusion into a vein. It is given in a hospital orclinic setting. Talk to your care team regarding the use of this medication in children.Special care may be needed. Overdosage: If you think you have taken too much of this medicine contact apoison control center or emergency room at once. NOTE: This medicine is only for you. Do not share this medicine with others. What if I miss a dose? This does not apply. What may interact with this medication? Delavirdine Itraconazole Ketoconazole This list may not describe all possible interactions. Give your health care provider a list of all the medicines, herbs, non-prescription drugs, or dietary supplements you use. Also tell them if you smoke, drink alcohol, or use illegaldrugs. Some items may interact with your medicine. What should I watch for while using this medication? Tell your doctor or health care professional if your condition does not startto get better or gets worse. Do not take with aspirin, ibuprofen, or other antiinflammatory medications.These can aggravate your condition. Do not smoke cigarettes or drink alcohol. These increase irritation in your stomach and can increase the time it will take for ulcers to heal. Cigarettesand alcohol can also worsen acid reflux or heartburn. If you get black, tarry stools or vomit up what looks like coffee grounds, callyour doctor or health care professional at once. You may have a bleeding ulcer. This medication may cause a decrease in vitamin B12. Make sure that you get enough vitamin B12 while you are taking this medication. Discuss the foods youeat and the vitamins you take with your care team. What side effects may I notice from receiving this medication? Side effects that you should report to your care team as soon as possible: Allergic reactions-skin rash, itching, hives,  swelling of the face, lips, tongue, or throat Confusion Hallucinations Side effects that usually do not require medical attention (report to your careteam if they continue or are bothersome): Constipation Diarrhea Dizziness Headache This list may not describe all possible side effects. Call your doctor for medical advice about side effects. You may report side effects to FDA at1-800-FDA-1088. Where should I keep my medication? This medication is given in a hospital or clinic. You will not be given thismedication to store at home. NOTE: This sheet is a summary. It may not cover all possible information. If you have questions about this medicine, talk to your doctor, pharmacist, orhealth care provider.  2022 Elsevier/Gold Standard (2020-02-07 10:05:26)  Diphenhydramine Injection What is this medication? DIPHENHYDRAMINE (dye fen HYE dra meen) treats the symptoms of allergic reactions. It may also be used to prevent and treat motion sickness or the symptoms of Parkinson disease. It works by blocking histamine, a substance released by the body during an allergic reaction. It belongs to a group ofmedications called antihistamines. This medicine may be used for other purposes; ask your health care provider orpharmacist if you have questions. COMMON BRAND NAME(S): Benadryl What should I tell my care team before I take this medication? They need to know if you have any of these conditions: Asthma or lung disease Glaucoma High blood pressure or heart disease Liver disease Pain or difficulty passing  urine Prostate trouble Ulcers or other stomach problems An unusual or allergic reaction to diphenhydramine, antihistamines, other medications foods, dyes, or preservatives Pregnant or trying to get pregnant Breast-feeding How should I use this medication? This medication is for injection into a vein or a muscle. It is usually givenin a hospital or clinic. If you get this medication at home, you  will be taught how to prepare and give this medication. Use exactly as directed. Take your medication at regularintervals. Do not take your medication more often than directed. It is important that you put your used needles and syringes in a special sharps container. Do not put them in a trash can. If you do not have a sharpscontainer, call your pharmacist or care team to get one. Talk to your care team regarding the use of this medication in children. While this medication may be prescribed for selected conditions, precautions doapply. This medication is not approved for use in newborns and premature babies. Patients over 8 years old may have a stronger reaction and need a smaller dose. Overdosage: If you think you have taken too much of this medicine contact apoison control center or emergency room at once. NOTE: This medicine is only for you. Do not share this medicine with others. What if I miss a dose? If you miss a dose, take it as soon as you can. If it is almost time for yournext dose, take only that dose. Do not take double or extra doses. What may interact with this medication? Do not take this medication with any of the following: MAOIs like Carbex, Eldepryl, Marplan, Nardil, and Parnate This medication may also interact with the following: Alcohol Barbiturates, like phenobarbital Medications for bladder spasm like oxybutynin, tolterodine Medications for blood pressure Medications for depression, anxiety, or psychotic disturbances Medications for movement abnormalities or Parkinson disease Medications for sleep Other medications for cold, cough or allergy Some medications for the stomach like chlordiazepoxide, dicyclomine This list may not describe all possible interactions. Give your health care provider a list of all the medicines, herbs, non-prescription drugs, or dietary supplements you use. Also tell them if you smoke, drink alcohol, or use illegaldrugs. Some items may interact  with your medicine. What should I watch for while using this medication? Your condition will be monitored carefully while you are receiving this medication. Tell your care team if your symptoms do not start to get better orif they get worse. You may get drowsy or dizzy. Do not drive, use machinery, or do anything that needs mental alertness until you know how this medication affects you. Do not stand or sit up quickly, especially if you are an older patient. This reduces the risk of dizzy or fainting spells. Alcohol may interfere with the effect ofthis medication. Avoid alcoholic drinks. Your mouth may get dry. Chewing sugarless gum or sucking hard candy, and drinking plenty of water may help. Contact your care team if the problem doesnot go away or is severe. What side effects may I notice from receiving this medication? Side effects that you should report to your care team as soon as possible: Allergic reactions-skin rash, itching, hives, swelling of the face, lips, tongue, or throat Sudden eye pain or change in vision such as blurry vision, seeing halos around lights, vision loss Trouble passing urine Side effects that usually do not require medical attention (report to your careteam if they continue or are bothersome): Constipation Drowsiness Dry mouth Headache Upset stomach This list may not describe all possible side  effects. Call your doctor for medical advice about side effects. You may report side effects to FDA at1-800-FDA-1088. Where should I keep my medication? Keep out of the reach of children and pets. If you are using this medication at home, you will be instructed on how to store this medication. Throw away any unused medication after the expirationdate on the label. NOTE: This sheet is a summary. It may not cover all possible information. If you have questions about this medicine, talk to your doctor, pharmacist, orhealth care provider.  2022 Elsevier/Gold Standard (2020-04-19  11:52:56)  Dexamethasone injection What is this medication? DEXAMETHASONE (dex a METH a sone) is a corticosteroid. It is used to treat inflammation of the skin, joints, lungs, and other organs. Common conditions treated include asthma, allergies, and arthritis. It is also used for otherconditions, like blood disorders and diseases of the adrenal glands. This medicine may be used for other purposes; ask your health care provider orpharmacist if you have questions. COMMON BRAND NAME(S): Decadron, DoubleDex, ReadySharp Dexamethasone, SimplistDexamethasone, Solurex What should I tell my care team before I take this medication? They need to know if you have any of these conditions: Cushing's syndrome diabetes glaucoma heart disease high blood pressure infection like herpes, measles, tuberculosis, or chickenpox kidney disease liver disease mental illness myasthenia gravis osteoporosis previous heart attack seizures stomach or intestine problems thyroid disease an unusual or allergic reaction to dexamethasone, corticosteroids, other medicines, lactose, foods, dyes, or preservatives pregnant or trying to get pregnant breast-feeding How should I use this medication? This medicine is for injection into a muscle, joint, lesion, soft tissue, orvein. It is given by a health care professional in a hospital or clinic setting. Talk to your pediatrician regarding the use of this medicine in children.Special care may be needed. Overdosage: If you think you have taken too much of this medicine contact apoison control center or emergency room at once. NOTE: This medicine is only for you. Do not share this medicine with others. What if I miss a dose? This may not apply. If you are having a series of injections over a prolonged period, try not to miss an appointment. Call your doctor or health careprofessional to reschedule if you are unable to keep an appointment. What may interact with this  medication? Do not take this medicine with any of the following medications: live virus vaccines This medicine may also interact with the following medications: aminoglutethimide amphotericin B aspirin and aspirin-like medicines certain antibiotics like erythromycin, clarithromycin, and troleandomycin certain antivirals for HIV or hepatitis certain medicines for seizures like carbamazepine, phenobarbital, phenytoin certain medicines to treat myasthenia gravis cholestyramine cyclosporine digoxin diuretics ephedrine female hormones, like estrogen or progestins and birth control pills insulin or other medicines for diabetes isoniazid ketoconazole medicines that relax muscles for surgery mifepristone NSAIDs, medicines for pain and inflammation, like ibuprofen or naproxen rifampin skin tests for allergies thalidomide vaccines warfarin This list may not describe all possible interactions. Give your health care provider a list of all the medicines, herbs, non-prescription drugs, or dietary supplements you use. Also tell them if you smoke, drink alcohol, or use illegaldrugs. Some items may interact with your medicine. What should I watch for while using this medication? Visit your health care professional for regular checks on your progress. Tell your health care professional if your symptoms do not start to get better or if they get worse. Your condition will be monitored carefully while you arereceiving this medicine. Wear a medical ID bracelet or chain. Carry  a card that describes your diseaseand details of your medicine and dosage times. This medicine may increase your risk of getting an infection. Call your health care professional for advice if you get a fever, chills, or sore throat, or other symptoms of a cold or flu. Do not treat yourself. Try to avoid being around people who are sick. Call your health care professional if you are around anyone with measles, chickenpox, or if you  develop sores or blistersthat do not heal properly. If you are going to need surgery or other procedures, tell your doctor or health care professional that you have taken this medicine within the last 75month. Ask your doctor or health care professional about your diet. You may need tolower the amount of salt you eat. This medicine may increase blood sugar. Ask your healthcare provider if changesin diet or medicines are needed if you have diabetes. What side effects may I notice from receiving this medication? Side effects that you should report to your doctor or health care professionalas soon as possible: allergic reactions like skin rash, itching or hives, swelling of the face, lips, or tongue bloody or black, tarry stools changes in emotions or moods changes in vision confusion, excitement, restlessness depressed mood eye pain hallucinations muscle weakness severe or sudden stomach or belly pain signs and symptoms of high blood sugar such as being more thirsty or hungry or having to urinate more than normal. You may also feel very tired or have blurry vision. signs and symptoms of infection like fever; chills; cough; sore throat; pain or trouble passing urine swelling of ankles, feet unusual bruising or bleeding wounds that do not heal Side effects that usually do not require medical attention (report to yourdoctor or health care professional if they continue or are bothersome): increased appetite increased growth of face or body hair headache nausea, vomiting pain, redness, or irritation at site where injected skin problems, acne, thin and shiny skin trouble sleeping weight gain This list may not describe all possible side effects. Call your doctor for medical advice about side effects. You may report side effects to FDA at1-800-FDA-1088. Where should I keep my medication? This medicine is given in a hospital or clinic and will not be stored at home. NOTE: This sheet is a  summary. It may not cover all possible information. If you have questions about this medicine, talk to your doctor, pharmacist, orhealth care provider.  2022 Elsevier/Gold Standard (2018-10-04 13:51:58)  Paclitaxel injection What is this medication? PACLITAXEL (PAK li TAX el) is a chemotherapy drug. It targets fast dividing cells, like cancer cells, and causes these cells to die. This medicine is used to treat ovarian cancer, breast cancer, lung cancer, Kaposi's sarcoma, andother cancers. This medicine may be used for other purposes; ask your health care provider orpharmacist if you have questions. COMMON BRAND NAME(S): Onxol, Taxol What should I tell my care team before I take this medication? They need to know if you have any of these conditions: history of irregular heartbeat liver disease low blood counts, like low Susumu Hackler cell, platelet, or red cell counts lung or breathing disease, like asthma tingling of the fingers or toes, or other nerve disorder an unusual or allergic reaction to paclitaxel, alcohol, polyoxyethylated castor oil, other chemotherapy, other medicines, foods, dyes, or preservatives pregnant or trying to get pregnant breast-feeding How should I use this medication? This drug is given as an infusion into a vein. It is administered in a hospitalor clinic by a specially trained health  care professional. Talk to your pediatrician regarding the use of this medicine in children.Special care may be needed. Overdosage: If you think you have taken too much of this medicine contact apoison control center or emergency room at once. NOTE: This medicine is only for you. Do not share this medicine with others. What if I miss a dose? It is important not to miss your dose. Call your doctor or health careprofessional if you are unable to keep an appointment. What may interact with this medication? Do not take this medicine with any of the following medications: live virus  vaccines This medicine may also interact with the following medications: antiviral medicines for hepatitis, HIV or AIDS certain antibiotics like erythromycin and clarithromycin certain medicines for fungal infections like ketoconazole and itraconazole certain medicines for seizures like carbamazepine, phenobarbital, phenytoin gemfibrozil nefazodone rifampin St. John's wort This list may not describe all possible interactions. Give your health care provider a list of all the medicines, herbs, non-prescription drugs, or dietary supplements you use. Also tell them if you smoke, drink alcohol, or use illegaldrugs. Some items may interact with your medicine. What should I watch for while using this medication? Your condition will be monitored carefully while you are receiving this medicine. You will need important blood work done while you are taking thismedicine. This medicine can cause serious allergic reactions. To reduce your risk you will need to take other medicine(s) before treatment with this medicine. If you experience allergic reactions like skin rash, itching or hives, swelling of theface, lips, or tongue, tell your doctor or health care professional right away. In some cases, you may be given additional medicines to help with side effects.Follow all directions for their use. This drug may make you feel generally unwell. This is not uncommon, as chemotherapy can affect healthy cells as well as cancer cells. Report any side effects. Continue your course of treatment even though you feel ill unless yourdoctor tells you to stop. Call your doctor or health care professional for advice if you get a fever, chills or sore throat, or other symptoms of a cold or flu. Do not treat yourself. This drug decreases your body's ability to fight infections. Try toavoid being around people who are sick. This medicine may increase your risk to bruise or bleed. Call your doctor orhealth care professional if you  notice any unusual bleeding. Be careful brushing and flossing your teeth or using a toothpick because you may get an infection or bleed more easily. If you have any dental work done,tell your dentist you are receiving this medicine. Avoid taking products that contain aspirin, acetaminophen, ibuprofen, naproxen, or ketoprofen unless instructed by your doctor. These medicines may hide afever. Do not become pregnant while taking this medicine. Women should inform their doctor if they wish to become pregnant or think they might be pregnant. There is a potential for serious side effects to an unborn child. Talk to your health care professional or pharmacist for more information. Do not breast-feed aninfant while taking this medicine. Men are advised not to father a child while receiving this medicine. This product may contain alcohol. Ask your pharmacist or healthcare provider if this medicine contains alcohol. Be sure to tell all healthcare providers you are taking this medicine. Certain medicines, like metronidazole and disulfiram, can cause an unpleasant reaction when taken with alcohol. The reaction includes flushing, headache, nausea, vomiting, sweating, and increased thirst. Thereaction can last from 30 minutes to several hours. What side effects may I notice from  receiving this medication? Side effects that you should report to your doctor or health care professionalas soon as possible: allergic reactions like skin rash, itching or hives, swelling of the face, lips, or tongue breathing problems changes in vision fast, irregular heartbeat high or low blood pressure mouth sores pain, tingling, numbness in the hands or feet signs of decreased platelets or bleeding - bruising, pinpoint red spots on the skin, black, tarry stools, blood in the urine signs of decreased red blood cells - unusually weak or tired, feeling faint or lightheaded, falls signs of infection - fever or chills, cough, sore throat,  pain or difficulty passing urine signs and symptoms of liver injury like dark yellow or brown urine; general ill feeling or flu-like symptoms; light-colored stools; loss of appetite; nausea; right upper belly pain; unusually weak or tired; yellowing of the eyes or skin swelling of the ankles, feet, hands unusually slow heartbeat Side effects that usually do not require medical attention (report to yourdoctor or health care professional if they continue or are bothersome): diarrhea hair loss loss of appetite muscle or joint pain nausea, vomiting pain, redness, or irritation at site where injected tiredness This list may not describe all possible side effects. Call your doctor for medical advice about side effects. You may report side effects to FDA at1-800-FDA-1088. Where should I keep my medication? This drug is given in a hospital or clinic and will not be stored at home. NOTE: This sheet is a summary. It may not cover all possible information. If you have questions about this medicine, talk to your doctor, pharmacist, orhealth care provider.  2022 Elsevier/Gold Standard (2019-02-22 13:37:23)

## 2020-11-04 NOTE — Progress Notes (Signed)
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  This was followed by diagnostic mammogram and ultrasound which showed a 7 x 7 x 5 mm hypoechoic mass in the 7 o'clock position of the left breast 8 cm from the nipple.  Ultrasound of the left axilla demonstrates 1 normal-appearing left axillary lymph nodes including 1 with borderline diffuse cortical thickening measuring 3.8 mm in maximum thickness.  No  Lymph nodes with focal or eccentric cortical thickening were seen.  The suspicious breast mass was biopsied and was consistent with triple negative grade 3 invasive mammary carcinoma.   1 cm invasive mammary carcinoma that was grade 2 with negative margins.  1 sentinel lymph node was negative for malignancy.  Patient underwent lumpectomy with sentinel lymph node biopsy.  Final pathology showed 1.1 cm grade 2 invasive mammary carcinoma with negative margins.  1 sentinel lymph node was negative for malignancy.  PT1c  pN 0.  Plan is for adjuvant dose dense AC chemotherapy along with 12 weekly cycles of Taxol     Interval history-neuropathy which patient had even prior to starting chemotherapy is presently stable.  She is tolerating chemotherapy well otherwise without any significant complaints.  She has chronic joint pain from  arthritis which is essentially unchanged.  Does report some more fatigue as well as exertional shortness of breath but denies any cough, sputum production or fever  ECOG PS- 1 Pain scale- 0  Review of systems- Review of Systems  Constitutional:  Positive for malaise/fatigue. Negative for chills, fever and weight loss.  HENT:  Negative for congestion, ear discharge and nosebleeds.   Eyes:  Negative for blurred vision.  Respiratory:  Positive for shortness of breath. Negative for cough, hemoptysis, sputum production and wheezing.   Cardiovascular:  Negative for chest pain, palpitations, orthopnea and claudication.  Gastrointestinal:  Negative for abdominal pain, blood in stool, constipation, diarrhea, heartburn, melena, nausea and vomiting.  Genitourinary:  Negative for dysuria, flank pain, frequency, hematuria and urgency.  Musculoskeletal:  Positive for joint pain. Negative for back pain and myalgias.  Skin:  Negative for rash.  Neurological:  Negative for dizziness, tingling, focal weakness, seizures, weakness and headaches.  Endo/Heme/Allergies:  Does not bruise/bleed easily.  Psychiatric/Behavioral:  Negative for depression and suicidal ideas. The patient does not have insomnia.      Allergies  Allergen Reactions   Keppra [Levetiracetam]    Tegretol [Carbamazepine]      Past Medical History:  Diagnosis Date   Allergy    AR (allergic rhinitis)    Arthritis    Family history of brain cancer    Family history of lung cancer    Hair loss    Hypertension    x 5years   Migraine    Psoriasis    Seizure ([place])    x 17years   Seizure disorder (H[facility])    Vitamin  D deficiency      Past Surgical History:  Procedure Laterality Date   BREAST BIOPSY Right    benign ? date-before 2006   BREAST BIOPSY Left 05/30/2020   [place] bx, coil marker, path pending   BREAST LUMPECTOMY WITH RADIOACTIVE SEED AND SENTINEL LYMPH NODE BIOPSY Left 06/24/2020   Procedure: LEFT BREAST LUMPECTOMY WITH  RADIOACTIVE SEED AND LEFT AXILLARY SENTINEL LYMPH NODE BIOPSY;  Surgeon: Rolm Bookbinder, MD;  Location: [facility];  Service: General;  Laterality: Left;   CESAREAN SECTION     CHOLECYSTECTOMY     KNEE SURGERY     PORTACATH PLACEMENT Right 06/24/2020   Procedure: INSERTION PORT-A-CATH;  Surgeon: Rolm Bookbinder, MD;  Location: [facility];  Service: General;  Laterality: Right;   TONSILLECTOMY     TUBAL LIGATION      Social History   Socioeconomic History   Marital status: Married    Spouse name: Event organiser   Number of children: 3   Years of education: College   Highest education level: Not on file  Occupational History   Occupation:      Employer: OTHER    Comment: [employer]  Tobacco Use   Smoking status: Never   Smokeless tobacco: Never  Vaping Use   Vaping Use: Never used  Substance and Sexual Activity   Alcohol use: Yes    Comment: Rarely- wine   Drug use: No   Sexual activity: Yes    Birth control/protection: Post-menopausal  Other Topics Concern   Not on file  Social History Narrative   Patient lives at home with her family.   Caffeine Use: 1 cups of coffee daily   Social Determinants of Health   Financial Resource Strain: Not on file  Food Insecurity: Not on file  Transportation Needs: Not on file  Physical Activity: Not on file  Stress: Not on file  Social Connections: Not on file  Intimate Partner Violence: Not on file    Family History  Problem Relation Age of Onset   Brain cancer Mother 7       tumor   Diabetes Father    Heart disease Father    Lung cancer Father    Cancer Paternal Aunt    Diabetes Maternal Grandmother    Stroke Maternal Grandfather    Breast cancer Neg Hx      Current Outpatient Medications:    fluticasone (FLONASE) 50 MCG/ACT nasal spray, Place 2 sprays into both nostrils daily., Disp: , Rfl:    LAMICTAL XR 200 MG TB24, Take 1 tablet (200 mg total) by mouth 2 (two) times  daily. Brand Medically Necessary, Disp: 180 tablet, Rfl: 3   lidocaine-prilocaine (EMLA) cream, Apply to affected area once, Disp: 30 g, Rfl: 3   lisinopril-hydrochlorothiazide (ZESTORETIC) 10-12.5 MG tablet, Take 1 tablet by mouth daily. Pt states 1/2 tablet a day, Disp: , Rfl:    loratadine (CLARITIN) 10 MG tablet, Take 10 mg by mouth daily., Disp: , Rfl:    loratadine (CLARITIN) 10 MG tablet, Take 10 mg by mouth daily., Disp: , Rfl:    pantoprazole (PROTONIX) 20 MG tablet, Take 20 mg by mouth daily., Disp: , Rfl:    traMADol (ULTRAM) 50 MG tablet, Take 1 tablet (50 mg total) by mouth every 6 (six) hours as needed., Disp: 30 tablet, Rfl: 0   LORazepam (ATIVAN) 0.5 MG tablet, Take 1 tablet (0.5 mg total) by mouth every 6 (six) hours as needed (Nausea or vomiting). (Patient  not taking: No sig reported), Disp: 30 tablet, Rfl: 0   Multiple Vitamin (MULTI-VITAMINS) TABS, Take 1 tablet by mouth daily.  (Patient not taking: No sig reported), Disp: , Rfl:    ondansetron (ZOFRAN) 8 MG tablet, Take 1 tablet (8 mg total) by mouth 2 (two) times daily as needed. Start on the third day after chemotherapy. (Patient not taking: No sig reported), Disp: 30 tablet, Rfl: 1   oxyCODONE (OXY IR/ROXICODONE) 5 MG immediate release tablet, Take 1 tablet (5 mg total) by mouth every 6 (six) hours as needed. (Patient not taking: Reported on 11/04/2020), Disp: 30 tablet, Rfl: 0   prochlorperazine (COMPAZINE) 10 MG tablet, TAKE 1 TABLET(10 MG) BY MOUTH EVERY 6 HOURS AS NEEDED FOR NAUSEA OR VOMITING (Patient not taking: No sig reported), Disp: 30 tablet, Rfl: 1   traZODone (DESYREL) 50 MG tablet, TAKE 1 TABLET(50 MG) BY MOUTH AT BEDTIME (Patient not taking: No sig reported), Disp: 30 tablet, Rfl: 1  Physical exam:  Vitals:   11/04/20 1104  BP: 121/79  Pulse: (!) 102  Resp: 16  Temp: 98.2 F (36.8 C)  TempSrc: Oral  Weight: 185 lb 9.6 oz (84.2 kg)  Height: '5\' 7"'  (1.702 m)   Physical Exam Cardiovascular:     Rate and  Rhythm: Normal rate and regular rhythm.     Heart sounds: Normal heart sounds.  Pulmonary:     Effort: Pulmonary effort is normal.     Breath sounds: Normal breath sounds.  Abdominal:     General: Bowel sounds are normal.     Palpations: Abdomen is soft.  Skin:    General: Skin is warm and dry.  Neurological:     Mental Status: She is alert and oriented to person, place, and time.     CMP Latest Ref Rng & Units 11/04/2020  Glucose 70 - 99 mg/dL 106(H)  BUN 8 - 23 mg/dL 15  Creatinine 0.44 - 1.00 mg/dL 0.64  Sodium 135 - 145 mmol/L 137  Potassium 3.5 - 5.1 mmol/L 3.9  Chloride 98 - 111 mmol/L 105  CO2 22 - 32 mmol/L 23  Calcium 8.9 - 10.3 mg/dL 9.0  Total Protein 6.5 - 8.1 g/dL 6.6  Total Bilirubin 0.3 - 1.2 mg/dL 0.3  Alkaline Phos 38 - 126 U/L 66  AST 15 - 41 U/L 21  ALT 0 - 44 U/L 23   CBC Latest Ref Rng & Units 11/04/2020  WBC 4.0 - 10.5 K/uL 3.9(L)  Hemoglobin 12.0 - 15.0 g/dL 10.9(L)  Hematocrit 36.0 - 46.0 % 33.3(L)  Platelets 150 - 400 K/uL 309    Assessment and plan- Patient is a 65 y.o. female  with pathological prognostic stage Ib invasive mammary carcinoma triple negative pT1 cpN0 cM0 s/p lumpectomy and sentinel lymph node biopsy.  She is here for on treatment assessment prior to cycle 8 of weekly Taxol chemotherapy  Counts okay to proceed with cycle 8 of weekly Taxol chemotherapy today.  She WILL proceed for cycle 9 next week and I will see her back in 3 weeks for cycle 10 as she is going out of town to [place] for a family trip.   Chemo induced neutropenia: Has improved and she does not require Neupogen today  Chemo induced anemia: Stable overall continue to monitor.  Suspect her exertional shortness of breath secondary to ongoing chemotherapy and anemia   Visit Diagnosis 1. Encounter for antineoplastic chemotherapy   2. Malignant neoplasm of lower-outer quadrant of left breast of female, estrogen  receptor negative ([place])   3. Antineoplastic chemotherapy induced  anemia      Dr. [name], MD, MPH [facility] [phone] 11/04/2020 4:30 PM

## 2020-11-05 ENCOUNTER — Encounter: Payer: Self-pay | Admitting: Oncology

## 2020-11-11 ENCOUNTER — Other Ambulatory Visit: Payer: Self-pay

## 2020-11-11 ENCOUNTER — Inpatient Hospital Stay: Payer: 59

## 2020-11-11 VITALS — BP 124/82 | HR 99 | Temp 97.3°F | Resp 18 | Wt 185.6 lb

## 2020-11-11 DIAGNOSIS — C50512 Malignant neoplasm of lower-outer quadrant of left female breast: Secondary | ICD-10-CM

## 2020-11-11 LAB — COMPREHENSIVE METABOLIC PANEL
ALT: 22 U/L (ref 0–44)
AST: 21 U/L (ref 15–41)
Albumin: 4 g/dL (ref 3.5–5.0)
Alkaline Phosphatase: 71 U/L (ref 38–126)
Anion gap: 8 (ref 5–15)
BUN: 15 mg/dL (ref 8–23)
CO2: 27 mmol/L (ref 22–32)
Calcium: 9 mg/dL (ref 8.9–10.3)
Chloride: 100 mmol/L (ref 98–111)
Creatinine, Ser: 0.64 mg/dL (ref 0.44–1.00)
GFR, Estimated: >60 mL/min (ref >60)
Glucose, Bld: 97 mg/dL (ref 70–99)
Potassium: 3.8 mmol/L (ref 3.5–5.1)
Sodium: 135 mmol/L (ref 135–145)
Total Bilirubin: 0.5 mg/dL (ref 0.3–1.2)
Total Protein: 6.8 g/dL (ref 6.5–8.1)

## 2020-11-11 LAB — CBC WITH DIFFERENTIAL/PLATELET
Abs Immature Granulocytes: 0.03 10*3/uL (ref 0.00–0.07)
Basophils Absolute: 0.1 10*3/uL (ref 0.0–0.1)
Basophils Relative: 1 %
Eosinophils Absolute: 0.1 10*3/uL (ref 0.0–0.5)
Eosinophils Relative: 3 %
HCT: 33.5 % — ABNORMAL LOW (ref 36.0–46.0)
Hemoglobin: 11 g/dL — ABNORMAL LOW (ref 12.0–15.0)
Immature Granulocytes: 1 %
Lymphocytes Relative: 20 %
Lymphs Abs: 0.7 10*3/uL (ref 0.7–4.0)
MCH: 32.5 pg (ref 26.0–34.0)
MCHC: 32.8 g/dL (ref 30.0–36.0)
MCV: 99.1 fL (ref 80.0–100.0)
Monocytes Absolute: 0.6 10*3/uL (ref 0.1–1.0)
Monocytes Relative: 17 %
Neutro Abs: 2.2 10*3/uL (ref 1.7–7.7)
Neutrophils Relative %: 58 %
Platelets: 320 10*3/uL (ref 150–400)
RBC: 3.38 MIL/uL — ABNORMAL LOW (ref 3.87–5.11)
RDW: 14.4 % (ref 11.5–15.5)
WBC: 3.7 10*3/uL — ABNORMAL LOW (ref 4.0–10.5)
nRBC: 0 % (ref 0.0–0.2)

## 2020-11-11 MED ORDER — DIPHENHYDRAMINE HCL 50 MG/ML IJ SOLN
50.0000 mg | Freq: Once | INTRAMUSCULAR | Status: AC
  Administered 2020-11-11: 50 mg via INTRAVENOUS
  Filled 2020-11-11: qty 1

## 2020-11-11 MED ORDER — HEPARIN SOD (PORK) LOCK FLUSH 100 UNIT/ML IV SOLN
500.0000 [IU] | Freq: Once | INTRAVENOUS | Status: DC | PRN
  Filled 2020-11-11: qty 5

## 2020-11-11 MED ORDER — SODIUM CHLORIDE 0.9 % IV SOLN
Freq: Once | INTRAVENOUS | Status: AC
  Filled 2020-11-11: qty 250

## 2020-11-11 MED ORDER — FAMOTIDINE 20 MG IN NS 100 ML IVPB
20.0000 mg | Freq: Once | INTRAVENOUS | Status: AC
  Administered 2020-11-11: 20 mg via INTRAVENOUS
  Filled 2020-11-11: qty 20

## 2020-11-11 MED ORDER — SODIUM CHLORIDE 0.9% FLUSH
10.0000 mL | INTRAVENOUS | Status: DC | PRN
  Administered 2020-11-11: 10 mL via INTRAVENOUS
  Filled 2020-11-11: qty 10

## 2020-11-11 MED ORDER — HEPARIN SOD (PORK) LOCK FLUSH 100 UNIT/ML IV SOLN
500.0000 [IU] | Freq: Once | INTRAVENOUS | Status: AC
  Administered 2020-11-11: 500 [IU] via INTRAVENOUS
  Filled 2020-11-11: qty 5

## 2020-11-11 MED ORDER — SODIUM CHLORIDE 0.9 % IV SOLN
20.0000 mg | Freq: Once | INTRAVENOUS | Status: AC
  Administered 2020-11-11: 20 mg via INTRAVENOUS
  Filled 2020-11-11: qty 20

## 2020-11-11 MED ORDER — SODIUM CHLORIDE 0.9 % IV SOLN
65.0000 mg/m2 | Freq: Once | INTRAVENOUS | Status: AC
  Administered 2020-11-11: 132 mg via INTRAVENOUS
  Filled 2020-11-11: qty 22

## 2020-11-18 ENCOUNTER — Inpatient Hospital Stay: Payer: 59

## 2020-11-18 ENCOUNTER — Inpatient Hospital Stay: Payer: 59 | Admitting: Oncology

## 2020-11-25 ENCOUNTER — Inpatient Hospital Stay: Payer: 59

## 2020-11-25 ENCOUNTER — Encounter: Payer: Self-pay | Admitting: Oncology

## 2020-11-25 ENCOUNTER — Inpatient Hospital Stay (HOSPITAL_BASED_OUTPATIENT_CLINIC_OR_DEPARTMENT_OTHER): Payer: 59 | Admitting: Oncology

## 2020-11-25 VITALS — BP 135/85 | HR 95

## 2020-11-25 VITALS — BP 132/89 | HR 97 | Temp 96.8°F | Wt 187.0 lb

## 2020-11-25 DIAGNOSIS — Z171 Estrogen receptor negative status [ER-]: Secondary | ICD-10-CM

## 2020-11-25 DIAGNOSIS — T451X5A Adverse effect of antineoplastic and immunosuppressive drugs, initial encounter: Secondary | ICD-10-CM | POA: Diagnosis not present

## 2020-11-25 DIAGNOSIS — C50512 Malignant neoplasm of lower-outer quadrant of left female breast: Secondary | ICD-10-CM | POA: Diagnosis not present

## 2020-11-25 DIAGNOSIS — D6481 Anemia due to antineoplastic chemotherapy: Secondary | ICD-10-CM

## 2020-11-25 DIAGNOSIS — Z5111 Encounter for antineoplastic chemotherapy: Secondary | ICD-10-CM

## 2020-11-25 LAB — COMPREHENSIVE METABOLIC PANEL
ALT: 28 U/L (ref 0–44)
AST: 22 U/L (ref 15–41)
Albumin: 3.9 g/dL (ref 3.5–5.0)
Alkaline Phosphatase: 77 U/L (ref 38–126)
Anion gap: 7 (ref 5–15)
BUN: 13 mg/dL (ref 8–23)
CO2: 25 mmol/L (ref 22–32)
Calcium: 9 mg/dL (ref 8.9–10.3)
Chloride: 107 mmol/L (ref 98–111)
Creatinine, Ser: 0.69 mg/dL (ref 0.44–1.00)
GFR, Estimated: >60 mL/min (ref >60)
Glucose, Bld: 105 mg/dL — ABNORMAL HIGH (ref 70–99)
Potassium: 3.7 mmol/L (ref 3.5–5.1)
Sodium: 139 mmol/L (ref 135–145)
Total Bilirubin: 0.5 mg/dL (ref 0.3–1.2)
Total Protein: 6.9 g/dL (ref 6.5–8.1)

## 2020-11-25 LAB — CBC WITH DIFFERENTIAL/PLATELET
Abs Immature Granulocytes: 0.04 10*3/uL (ref 0.00–0.07)
Basophils Absolute: 0.1 10*3/uL (ref 0.0–0.1)
Basophils Relative: 1 %
Eosinophils Absolute: 0.2 10*3/uL (ref 0.0–0.5)
Eosinophils Relative: 3 %
HCT: 36 % (ref 36.0–46.0)
Hemoglobin: 11.7 g/dL — ABNORMAL LOW (ref 12.0–15.0)
Immature Granulocytes: 1 %
Lymphocytes Relative: 20 %
Lymphs Abs: 1.1 10*3/uL (ref 0.7–4.0)
MCH: 31.6 pg (ref 26.0–34.0)
MCHC: 32.5 g/dL (ref 30.0–36.0)
MCV: 97.3 fL (ref 80.0–100.0)
Monocytes Absolute: 0.8 10*3/uL (ref 0.1–1.0)
Monocytes Relative: 16 %
Neutro Abs: 3 10*3/uL (ref 1.7–7.7)
Neutrophils Relative %: 59 %
Platelets: 314 10*3/uL (ref 150–400)
RBC: 3.7 MIL/uL — ABNORMAL LOW (ref 3.87–5.11)
RDW: 13.8 % (ref 11.5–15.5)
WBC: 5.1 10*3/uL (ref 4.0–10.5)
nRBC: 0 % (ref 0.0–0.2)

## 2020-11-25 MED ORDER — DIPHENHYDRAMINE HCL 50 MG/ML IJ SOLN
50.0000 mg | Freq: Once | INTRAMUSCULAR | Status: AC
  Administered 2020-11-25: 50 mg via INTRAVENOUS
  Filled 2020-11-25: qty 1

## 2020-11-25 MED ORDER — HEPARIN SOD (PORK) LOCK FLUSH 100 UNIT/ML IV SOLN
500.0000 [IU] | Freq: Once | INTRAVENOUS | Status: DC
  Filled 2020-11-25: qty 5

## 2020-11-25 MED ORDER — SODIUM CHLORIDE 0.9 % IV SOLN
65.0000 mg/m2 | Freq: Once | INTRAVENOUS | Status: AC
  Administered 2020-11-25: 132 mg via INTRAVENOUS
  Filled 2020-11-25: qty 22

## 2020-11-25 MED ORDER — SODIUM CHLORIDE 0.9 % IV SOLN
20.0000 mg | Freq: Once | INTRAVENOUS | Status: AC
  Administered 2020-11-25: 20 mg via INTRAVENOUS
  Filled 2020-11-25: qty 20

## 2020-11-25 MED ORDER — SODIUM CHLORIDE 0.9% FLUSH
10.0000 mL | INTRAVENOUS | Status: DC | PRN
  Administered 2020-11-25: 10 mL via INTRAVENOUS
  Filled 2020-11-25: qty 10

## 2020-11-25 MED ORDER — HEPARIN SOD (PORK) LOCK FLUSH 100 UNIT/ML IV SOLN
500.0000 [IU] | Freq: Once | INTRAVENOUS | Status: AC | PRN
  Administered 2020-11-25: 500 [IU]
  Filled 2020-11-25: qty 5

## 2020-11-25 MED ORDER — FAMOTIDINE 20 MG IN NS 100 ML IVPB
20.0000 mg | Freq: Once | INTRAVENOUS | Status: AC
  Administered 2020-11-25: 20 mg via INTRAVENOUS
  Filled 2020-11-25: qty 20

## 2020-11-25 MED ORDER — SODIUM CHLORIDE 0.9 % IV SOLN
Freq: Once | INTRAVENOUS | Status: AC
  Filled 2020-11-25: qty 250

## 2020-11-25 NOTE — Progress Notes (Signed)
Patient here for oncology follow-up appointment,  concerns of needing ok for dentist apt, neutrophils labs, port removal, question about ibuprofen

## 2020-11-25 NOTE — Progress Notes (Signed)
I connected with Anna Stewart on 11/25/20 at  9:30 AM EDT by video enabled telemedicine visit and verified that I am speaking with the correct person using two identifiers.   I discussed the limitations, risks, security and privacy concerns of performing an evaluation and management service by telemedicine and the availability of in-person appointments. I also discussed with the patient that there may be a patient responsible charge related to this service. The patient expressed understanding and agreed to proceed.  Other persons participating in the visit and their role in the encounter:  patients husband  Patient's location:  cancer center Provider's location:  home  Chief Complaint: On treatment assessment prior to cycle 10 of weekly Taxol chemotherapy  History of present illness: Patient is a 65 year old female who underwent a routine screening bilateral mammogram on 04/25/2020 which suggested a possible mass in the left breast.  This was followed by diagnostic mammogram and ultrasound which showed a 7 x 7 x 5 mm hypoechoic mass in the 7 o'clock position of the left breast 8 cm from the nipple.  Ultrasound of the left axilla demonstrates 1 normal-appearing left axillary lymph nodes including 1 with borderline diffuse cortical thickening measuring 3.8 mm in maximum thickness.  No  Lymph nodes with focal or eccentric cortical thickening were seen.  The suspicious breast mass was biopsied and was consistent with triple negative grade 3 invasive mammary carcinoma.   1 cm invasive mammary carcinoma that was grade 2 with negative margins.  1 sentinel lymph node was negative for malignancy.  Patient underwent lumpectomy with sentinel lymph node biopsy.  Final pathology showed 1.1 cm grade 2 invasive mammary carcinoma with negative margins.  1 sentinel lymph node was negative for malignancy.  PT1c  pN 0.  Plan is for adjuvant dose dense AC chemotherapy along with 12 weekly cycles of Taxol        Interval history neuropathy is at her baseline and she has had that even prior to chemo. No worse today. Doing well overall   Review of Systems  Constitutional:  Positive for malaise/fatigue. Negative for chills, fever and weight loss.  HENT:  Negative for congestion, ear discharge and nosebleeds.   Eyes:  Negative for blurred vision.  Respiratory:  Negative for cough, hemoptysis, sputum production, shortness of breath and wheezing.   Cardiovascular:  Negative for chest pain, palpitations, orthopnea and claudication.  Gastrointestinal:  Negative for abdominal pain, blood in stool, constipation, diarrhea, heartburn, melena, nausea and vomiting.  Genitourinary:  Negative for dysuria, flank pain, frequency, hematuria and urgency.  Musculoskeletal:  Negative for back pain, joint pain and myalgias.  Skin:  Negative for rash.  Neurological:  Positive for sensory change (peripheral neuropaathy). Negative for dizziness, tingling, focal weakness, seizures, weakness and headaches.  Endo/Heme/Allergies:  Does not bruise/bleed easily.  Psychiatric/Behavioral:  Negative for depression and suicidal ideas. The patient does not have insomnia.    Allergies  Allergen Reactions   Keppra [Levetiracetam]    Tegretol [Carbamazepine]     Past Medical History:  Diagnosis Date   Allergy    AR (allergic rhinitis)    Arthritis    Family history of brain cancer    Family history of lung cancer    Hair loss    Hypertension    x 5years   Migraine    Psoriasis    Seizure ([place])    x 17years   Seizure disorder ([place])    Vitamin D deficiency     Past Surgical History:  Procedure Laterality Date   BREAST BIOPSY Right    benign ? date-before 2006   BREAST BIOPSY Left 05/30/2020   [place] bx, coil marker, path pending   BREAST LUMPECTOMY WITH RADIOACTIVE SEED AND SENTINEL LYMPH NODE BIOPSY Left 06/24/2020   Procedure: LEFT BREAST LUMPECTOMY WITH RADIOACTIVE SEED AND LEFT AXILLARY SENTINEL LYMPH NODE BIOPSY;   Surgeon: Rolm Bookbinder, MD;  Location: [facility];  Service: General;  Laterality: Left;   CESAREAN SECTION     CHOLECYSTECTOMY     KNEE SURGERY     PORTACATH PLACEMENT Right 06/24/2020   Procedure: INSERTION PORT-A-CATH;  Surgeon: Rolm Bookbinder, MD;  Location: [facility];  Service: General;  Laterality: Right;   TONSILLECTOMY     TUBAL LIGATION      Social History   Socioeconomic History   Marital status: Married    Spouse name: Event organiser   Number of children: 3   Years of education: College   Highest education level: Not on file  Occupational History   Occupation:      Employer: OTHER    Comment: [employer]  Tobacco Use   Smoking status: Never   Smokeless tobacco: Never  Vaping Use   Vaping Use: Never used  Substance and Sexual Activity   Alcohol use: Yes    Comment: Rarely- wine   Drug use: No   Sexual activity: Yes    Birth control/protection: Post-menopausal  Other Topics Concern   Not on file  Social History Narrative   Patient lives at home with her family.   Caffeine Use: 1 cups of coffee daily   Social Determinants of Health   Financial Resource Strain: Not on file  Food Insecurity: Not on file  Transportation Needs: Not on file  Physical Activity: Not on file  Stress: Not on file  Social Connections: Not on file  Intimate Partner Violence: Not on file    Family History  Problem Relation Age of Onset   Brain cancer Mother 21       tumor   Diabetes Father    Heart disease Father    Lung cancer Father    Cancer Paternal Aunt    Diabetes Maternal Grandmother    Stroke Maternal Grandfather    Breast cancer Neg Hx      Current Outpatient Medications:    fluticasone (FLONASE) 50 MCG/ACT nasal spray, Place 2 sprays into both nostrils daily., Disp: , Rfl:    LAMICTAL XR 200 MG TB24, Take 1 tablet (200 mg total) by mouth 2 (two) times daily. Brand Medically Necessary, Disp: 180 tablet, Rfl: 3    lidocaine-prilocaine (EMLA) cream, Apply to affected area once, Disp: 30 g, Rfl: 3   lisinopril-hydrochlorothiazide (ZESTORETIC) 10-12.5 MG tablet, Take 1 tablet by mouth daily. Pt states 1/2 tablet a day, Disp: , Rfl:    loratadine (CLARITIN) 10 MG tablet, Take 10 mg by mouth daily., Disp: , Rfl:    loratadine (CLARITIN) 10 MG tablet, Take 10 mg by mouth daily., Disp: , Rfl:    LORazepam (ATIVAN) 0.5 MG tablet, Take 1 tablet (0.5 mg total) by mouth every 6 (six) hours as needed (Nausea or vomiting). (Patient not taking: No sig reported), Disp: 30 tablet, Rfl: 0   Multiple Vitamin (MULTI-VITAMINS) TABS, Take 1 tablet by mouth daily.  (Patient not taking: No sig reported), Disp: , Rfl:    ondansetron (ZOFRAN) 8 MG tablet, Take 1 tablet (8 mg total) by mouth 2 (two) times daily as needed.  Start on the third day after chemotherapy. (Patient not taking: No sig reported), Disp: 30 tablet, Rfl: 1   oxyCODONE (OXY IR/ROXICODONE) 5 MG immediate release tablet, Take 1 tablet (5 mg total) by mouth every 6 (six) hours as needed. (Patient not taking: Reported on 11/04/2020), Disp: 30 tablet, Rfl: 0   pantoprazole (PROTONIX) 20 MG tablet, Take 20 mg by mouth daily., Disp: , Rfl:    prochlorperazine (COMPAZINE) 10 MG tablet, TAKE 1 TABLET(10 MG) BY MOUTH EVERY 6 HOURS AS NEEDED FOR NAUSEA OR VOMITING (Patient not taking: No sig reported), Disp: 30 tablet, Rfl: 1   traMADol (ULTRAM) 50 MG tablet, Take 1 tablet (50 mg total) by mouth every 6 (six) hours as needed., Disp: 30 tablet, Rfl: 0   traZODone (DESYREL) 50 MG tablet, TAKE 1 TABLET(50 MG) BY MOUTH AT BEDTIME (Patient not taking: No sig reported), Disp: 30 tablet, Rfl: 1  No results found.  No images are attached to the encounter.   CMP Latest Ref Rng & Units 11/11/2020  Glucose 70 - 99 mg/dL 97  BUN 8 - 23 mg/dL 15  Creatinine 0.44 - 1.00 mg/dL 0.64  Sodium 135 - 145 mmol/L 135  Potassium 3.5 - 5.1 mmol/L 3.8  Chloride 98 - 111 mmol/L 100  CO2 22 - 32  mmol/L 27  Calcium 8.9 - 10.3 mg/dL 9.0  Total Protein 6.5 - 8.1 g/dL 6.8  Total Bilirubin 0.3 - 1.2 mg/dL 0.5  Alkaline Phos 38 - 126 U/L 71  AST 15 - 41 U/L 21  ALT 0 - 44 U/L 22   CBC Latest Ref Rng & Units 11/11/2020  WBC 4.0 - 10.5 K/uL 3.7(L)  Hemoglobin 12.0 - 15.0 g/dL 11.0(L)  Hematocrit 36.0 - 46.0 % 33.5(L)  Platelets 150 - 400 K/uL 320     Observation/objective: Appears in no acute distress over video visit today.  Breathing is nonlabored  Assessment and plan: Patient is a 65 year old female with pathological prognostic stage Ib invasive mammary carcinoma triple negative pT1 cN0 M0 s/p lumpectomy and sentinel lymph node biopsy currently on adjuvant chemotherapy and she is here for on treatment assessment prior to cycle 10 of weekly Taxol chemotherapy  Counts okay to proceed with cycle 10 of weekly Taxol chemotherapy today.  She will directly proceed for cycle 11 next week and I will see her back in 2 weeks for cycle 12 which will be her last cycle.  I am also making a referral to radiation oncology at this time.  Chemo induced anemia:stable continue to monitor  Follow-up instructions:as above  I discussed the assessment and treatment plan with the patient. The patient was provided an opportunity to ask questions and all were answered. The patient agreed with the plan and demonstrated an understanding of the instructions.   The patient was advised to call back or seek an in-person evaluation if the symptoms worsen or if the condition fails to improve as anticipated.   Visit Diagnosis: 1. Encounter for antineoplastic chemotherapy   2. Malignant neoplasm of lower-outer quadrant of left breast of female, estrogen receptor negative ([place])   3. Antineoplastic chemotherapy induced anemia     Dr. [name], MD, MPH [facility] at [facility] Tel- [phone] 11/25/2020 5:59 PM

## 2020-12-02 ENCOUNTER — Other Ambulatory Visit: Payer: Self-pay

## 2020-12-02 ENCOUNTER — Inpatient Hospital Stay: Payer: 59

## 2020-12-02 VITALS — BP 118/80 | HR 104 | Temp 98.8°F | Resp 18

## 2020-12-02 DIAGNOSIS — Z171 Estrogen receptor negative status [ER-]: Secondary | ICD-10-CM

## 2020-12-02 DIAGNOSIS — C50512 Malignant neoplasm of lower-outer quadrant of left female breast: Secondary | ICD-10-CM | POA: Diagnosis not present

## 2020-12-02 LAB — COMPREHENSIVE METABOLIC PANEL
ALT: 28 U/L (ref 0–44)
AST: 25 U/L (ref 15–41)
Albumin: 4 g/dL (ref 3.5–5.0)
Alkaline Phosphatase: 79 U/L (ref 38–126)
Anion gap: 10 (ref 5–15)
BUN: 17 mg/dL (ref 8–23)
CO2: 25 mmol/L (ref 22–32)
Calcium: 9.1 mg/dL (ref 8.9–10.3)
Chloride: 100 mmol/L (ref 98–111)
Creatinine, Ser: 0.76 mg/dL (ref 0.44–1.00)
GFR, Estimated: >60 mL/min (ref >60)
Glucose, Bld: 100 mg/dL — ABNORMAL HIGH (ref 70–99)
Potassium: 3.9 mmol/L (ref 3.5–5.1)
Sodium: 135 mmol/L (ref 135–145)
Total Bilirubin: 0.4 mg/dL (ref 0.3–1.2)
Total Protein: 6.9 g/dL (ref 6.5–8.1)

## 2020-12-02 LAB — CBC WITH DIFFERENTIAL/PLATELET
Abs Immature Granulocytes: 0.04 10*3/uL (ref 0.00–0.07)
Basophils Absolute: 0.1 10*3/uL (ref 0.0–0.1)
Basophils Relative: 1 %
Eosinophils Absolute: 0.2 10*3/uL (ref 0.0–0.5)
Eosinophils Relative: 4 %
HCT: 36.1 % (ref 36.0–46.0)
Hemoglobin: 12 g/dL (ref 12.0–15.0)
Immature Granulocytes: 1 %
Lymphocytes Relative: 19 %
Lymphs Abs: 1.1 10*3/uL (ref 0.7–4.0)
MCH: 31.7 pg (ref 26.0–34.0)
MCHC: 33.2 g/dL (ref 30.0–36.0)
MCV: 95.3 fL (ref 80.0–100.0)
Monocytes Absolute: 0.7 10*3/uL (ref 0.1–1.0)
Monocytes Relative: 12 %
Neutro Abs: 3.8 10*3/uL (ref 1.7–7.7)
Neutrophils Relative %: 63 %
Platelets: 315 10*3/uL (ref 150–400)
RBC: 3.79 MIL/uL — ABNORMAL LOW (ref 3.87–5.11)
RDW: 14 % (ref 11.5–15.5)
WBC: 6 10*3/uL (ref 4.0–10.5)
nRBC: 0 % (ref 0.0–0.2)

## 2020-12-02 MED ORDER — HEPARIN SOD (PORK) LOCK FLUSH 100 UNIT/ML IV SOLN
INTRAVENOUS | Status: AC
  Administered 2020-12-02: 500 [IU]
  Filled 2020-12-02: qty 5

## 2020-12-02 MED ORDER — SODIUM CHLORIDE 0.9 % IV SOLN
Freq: Once | INTRAVENOUS | Status: AC
  Filled 2020-12-02: qty 250

## 2020-12-02 MED ORDER — SODIUM CHLORIDE 0.9% FLUSH
10.0000 mL | Freq: Once | INTRAVENOUS | Status: AC
  Administered 2020-12-02: 10 mL via INTRAVENOUS
  Filled 2020-12-02: qty 10

## 2020-12-02 MED ORDER — SODIUM CHLORIDE 0.9 % IV SOLN
65.0000 mg/m2 | Freq: Once | INTRAVENOUS | Status: AC
  Administered 2020-12-02: 132 mg via INTRAVENOUS
  Filled 2020-12-02: qty 22

## 2020-12-02 MED ORDER — DIPHENHYDRAMINE HCL 50 MG/ML IJ SOLN
50.0000 mg | Freq: Once | INTRAMUSCULAR | Status: AC
Start: 2020-12-02 — End: 2020-12-02
  Administered 2020-12-02: 50 mg via INTRAVENOUS
  Filled 2020-12-02: qty 1

## 2020-12-02 MED ORDER — SODIUM CHLORIDE 0.9 % IV SOLN
20.0000 mg | Freq: Once | INTRAVENOUS | Status: AC
  Administered 2020-12-02: 20 mg via INTRAVENOUS
  Filled 2020-12-02: qty 20

## 2020-12-02 MED ORDER — FAMOTIDINE 20 MG IN NS 100 ML IVPB
20.0000 mg | Freq: Once | INTRAVENOUS | Status: AC
  Administered 2020-12-02: 20 mg via INTRAVENOUS
  Filled 2020-12-02: qty 20

## 2020-12-02 NOTE — Progress Notes (Signed)
Per MD ok to treat with HR of 104

## 2020-12-02 NOTE — Patient Instructions (Addendum)
CANCER CENTER [place] [facility]  Discharge Instructions: Thank you for choosing [facility] to provide your oncology and hematology care.  If you have a lab appointment with the Cancer Center, please go directly to the Cancer Center and check in at the registration area.  Wear comfortable clothing and clothing appropriate for easy access to any Portacath or PICC line.   We strive to give you quality time with your provider. You may need to reschedule your appointment if you arrive late (15 or more minutes).  Arriving late affects you and other patients whose appointments are after yours.  Also, if you miss three or more appointments without notifying the office, you may be dismissed from the clinic at the provider's discretion.      For prescription refill requests, have your pharmacy contact our office and allow 72 hours for refills to be completed.    Today you received the following chemotherapy and/or immunotherapy agents TAXOL      To help prevent nausea and vomiting after your treatment, we encourage you to take your nausea medication as directed.  BELOW ARE SYMPTOMS THAT SHOULD BE REPORTED IMMEDIATELY: *FEVER GREATER THAN 100.4 F (38 C) OR HIGHER *CHILLS OR SWEATING *NAUSEA AND VOMITING THAT IS NOT CONTROLLED WITH YOUR NAUSEA MEDICATION *UNUSUAL SHORTNESS OF BREATH *UNUSUAL BRUISING OR BLEEDING *URINARY PROBLEMS (pain or burning when urinating, or frequent urination) *BOWEL PROBLEMS (unusual diarrhea, constipation, pain near the anus) TENDERNESS IN MOUTH AND THROAT WITH OR WITHOUT PRESENCE OF ULCERS (sore throat, sores in mouth, or a toothache) UNUSUAL RASH, SWELLING OR PAIN  UNUSUAL VAGINAL DISCHARGE OR ITCHING   Items with * indicate a potential emergency and should be followed up as soon as possible or go to the Emergency Department if any problems should occur.  Please show the CHEMOTHERAPY ALERT CARD or IMMUNOTHERAPY ALERT CARD at check-in to  the Emergency Department and triage nurse.  Should you have questions after your visit or need to cancel or reschedule your appointment, please contact [facility] C[facility] [place] [facility]  [phone] and follow the prompts.  Office hours are 8:00 a.m. to 4:30 p.m. Monday - Friday. Please note that voicemails left after 4:00 p.m. may not be returned until the following business day.  We are closed weekends and major holidays. You have access to a nurse at all times for urgent questions. Please call the main number to the clinic [phone] and follow the prompts.  For any non-urgent questions, you may also contact your provider using MyChart. We now offer e-Visits for anyone 18 and older to request care online for non-urgent symptoms. For details visit [email or web address].   Also download the MyChart app! Go to the app store, search "MyChart", open the app, select [facility], and log in with your MyChart username and password.  Due to Covid, a mask is required upon entering the hospital/clinic. If you do not have a mask, one will be given to you upon arrival. For doctor visits, patients may have 1 support person aged 18 or older with them. For treatment visits, patients cannot have anyone with them due to current Covid guidelines and our immunocompromised population.   Paclitaxel injection What is this medication? PACLITAXEL (PAK li TAX el) is a chemotherapy drug. It targets fast dividing cells, like cancer cells, and causes these cells to die. This medicine is used to treat ovarian cancer, breast cancer, lung cancer, Kaposi's sarcoma, and other cancers. This medicine may be used for other purposes; ask   your health care provider or pharmacist if you have questions. COMMON BRAND NAME(S): Onxol, Taxol What should I tell my care team before I take this medication? They need to know if you have any of these conditions: history of irregular heartbeat liver disease low blood counts,  like low white cell, platelet, or red cell counts lung or breathing disease, like asthma tingling of the fingers or toes, or other nerve disorder an unusual or allergic reaction to paclitaxel, alcohol, polyoxyethylated castor oil, other chemotherapy, other medicines, foods, dyes, or preservatives pregnant or trying to get pregnant breast-feeding How should I use this medication? This drug is given as an infusion into a vein. It is administered in a hospital or clinic by a specially trained health care professional. Talk to your pediatrician regarding the use of this medicine in children. Special care may be needed. Overdosage: If you think you have taken too much of this medicine contact a poison control center or emergency room at once. NOTE: This medicine is only for you. Do not share this medicine with others. What if I miss a dose? It is important not to miss your dose. Call your doctor or health care professional if you are unable to keep an appointment. What may interact with this medication? Do not take this medicine with any of the following medications: live virus vaccines This medicine may also interact with the following medications: antiviral medicines for hepatitis, HIV or AIDS certain antibiotics like erythromycin and clarithromycin certain medicines for fungal infections like ketoconazole and itraconazole certain medicines for seizures like carbamazepine, phenobarbital, phenytoin gemfibrozil nefazodone rifampin St. John's wort This list may not describe all possible interactions. Give your health care provider a list of all the medicines, herbs, non-prescription drugs, or dietary supplements you use. Also tell them if you smoke, drink alcohol, or use illegal drugs. Some items may interact with your medicine. What should I watch for while using this medication? Your condition will be monitored carefully while you are receiving this medicine. You will need important blood work  done while you are taking this medicine. This medicine can cause serious allergic reactions. To reduce your risk you will need to take other medicine(s) before treatment with this medicine. If you experience allergic reactions like skin rash, itching or hives, swelling of the face, lips, or tongue, tell your doctor or health care professional right away. In some cases, you may be given additional medicines to help with side effects. Follow all directions for their use. This drug may make you feel generally unwell. This is not uncommon, as chemotherapy can affect healthy cells as well as cancer cells. Report any side effects. Continue your course of treatment even though you feel ill unless your doctor tells you to stop. Call your doctor or health care professional for advice if you get a fever, chills or sore throat, or other symptoms of a cold or flu. Do not treat yourself. This drug decreases your body's ability to fight infections. Try to avoid being around people who are sick. This medicine may increase your risk to bruise or bleed. Call your doctor or health care professional if you notice any unusual bleeding. Be careful brushing and flossing your teeth or using a toothpick because you may get an infection or bleed more easily. If you have any dental work done, tell your dentist you are receiving this medicine. Avoid taking products that contain aspirin, acetaminophen, ibuprofen, naproxen, or ketoprofen unless instructed by your doctor. These medicines may hide a   fever. Do not become pregnant while taking this medicine. Women should inform their doctor if they wish to become pregnant or think they might be pregnant. There is a potential for serious side effects to an unborn child. Talk to your health care professional or pharmacist for more information. Do not breast-feed an infant while taking this medicine. Men are advised not to father a child while receiving this medicine. This product may  contain alcohol. Ask your pharmacist or healthcare provider if this medicine contains alcohol. Be sure to tell all healthcare providers you are taking this medicine. Certain medicines, like metronidazole and disulfiram, can cause an unpleasant reaction when taken with alcohol. The reaction includes flushing, headache, nausea, vomiting, sweating, and increased thirst. The reaction can last from 30 minutes to several hours. What side effects may I notice from receiving this medication? Side effects that you should report to your doctor or health care professional as soon as possible: allergic reactions like skin rash, itching or hives, swelling of the face, lips, or tongue breathing problems changes in vision fast, irregular heartbeat high or low blood pressure mouth sores pain, tingling, numbness in the hands or feet signs of decreased platelets or bleeding - bruising, pinpoint red spots on the skin, black, tarry stools, blood in the urine signs of decreased red blood cells - unusually weak or tired, feeling faint or lightheaded, falls signs of infection - fever or chills, cough, sore throat, pain or difficulty passing urine signs and symptoms of liver injury like dark yellow or brown urine; general ill feeling or flu-like symptoms; light-colored stools; loss of appetite; nausea; right upper belly pain; unusually weak or tired; yellowing of the eyes or skin swelling of the ankles, feet, hands unusually slow heartbeat Side effects that usually do not require medical attention (report to your doctor or health care professional if they continue or are bothersome): diarrhea hair loss loss of appetite muscle or joint pain nausea, vomiting pain, redness, or irritation at site where injected tiredness This list may not describe all possible side effects. Call your doctor for medical advice about side effects. You may report side effects to FDA at 1-800-FDA-1088. Where should I keep my  medication? This drug is given in a hospital or clinic and will not be stored at home. NOTE: This sheet is a summary. It may not cover all possible information. If you have questions about this medicine, talk to your doctor, pharmacist, or health care provider.  2022 Elsevier/Gold Standard (2019-02-22 13:37:23)  

## 2020-12-03 ENCOUNTER — Encounter: Payer: Self-pay | Admitting: Oncology

## 2020-12-05 ENCOUNTER — Ambulatory Visit: Payer: 59 | Admitting: Radiation Oncology

## 2020-12-10 ENCOUNTER — Other Ambulatory Visit: Payer: Self-pay | Admitting: *Deleted

## 2020-12-10 ENCOUNTER — Inpatient Hospital Stay: Payer: 59

## 2020-12-10 ENCOUNTER — Telehealth: Payer: Self-pay | Admitting: *Deleted

## 2020-12-10 ENCOUNTER — Other Ambulatory Visit: Payer: Self-pay

## 2020-12-10 ENCOUNTER — Encounter: Payer: Self-pay | Admitting: Oncology

## 2020-12-10 ENCOUNTER — Inpatient Hospital Stay (HOSPITAL_BASED_OUTPATIENT_CLINIC_OR_DEPARTMENT_OTHER): Payer: 59 | Admitting: Oncology

## 2020-12-10 ENCOUNTER — Inpatient Hospital Stay: Payer: 59 | Attending: Oncology

## 2020-12-10 VITALS — BP 146/89 | HR 107 | Resp 16

## 2020-12-10 VITALS — BP 121/89 | HR 112 | Temp 97.0°F | Resp 18 | Wt 187.0 lb

## 2020-12-10 DIAGNOSIS — Z171 Estrogen receptor negative status [ER-]: Secondary | ICD-10-CM

## 2020-12-10 DIAGNOSIS — Z5111 Encounter for antineoplastic chemotherapy: Secondary | ICD-10-CM

## 2020-12-10 DIAGNOSIS — C50512 Malignant neoplasm of lower-outer quadrant of left female breast: Secondary | ICD-10-CM

## 2020-12-10 DIAGNOSIS — Z95828 Presence of other vascular implants and grafts: Secondary | ICD-10-CM

## 2020-12-10 DIAGNOSIS — C50312 Malignant neoplasm of lower-inner quadrant of left female breast: Secondary | ICD-10-CM | POA: Insufficient documentation

## 2020-12-10 LAB — CBC WITH DIFFERENTIAL/PLATELET
Abs Immature Granulocytes: 0.03 10*3/uL (ref 0.00–0.07)
Basophils Absolute: 0.1 10*3/uL (ref 0.0–0.1)
Basophils Relative: 1 %
Eosinophils Absolute: 0.2 10*3/uL (ref 0.0–0.5)
Eosinophils Relative: 4 %
HCT: 36.1 % (ref 36.0–46.0)
Hemoglobin: 11.8 g/dL — ABNORMAL LOW (ref 12.0–15.0)
Immature Granulocytes: 1 %
Lymphocytes Relative: 22 %
Lymphs Abs: 0.9 10*3/uL (ref 0.7–4.0)
MCH: 31.6 pg (ref 26.0–34.0)
MCHC: 32.7 g/dL (ref 30.0–36.0)
MCV: 96.5 fL (ref 80.0–100.0)
Monocytes Absolute: 0.7 10*3/uL (ref 0.1–1.0)
Monocytes Relative: 16 %
Neutro Abs: 2.4 10*3/uL (ref 1.7–7.7)
Neutrophils Relative %: 56 %
Platelets: 337 10*3/uL (ref 150–400)
RBC: 3.74 MIL/uL — ABNORMAL LOW (ref 3.87–5.11)
RDW: 14.4 % (ref 11.5–15.5)
WBC: 4.2 10*3/uL (ref 4.0–10.5)
nRBC: 0 % (ref 0.0–0.2)

## 2020-12-10 LAB — COMPREHENSIVE METABOLIC PANEL
ALT: 22 U/L (ref 0–44)
AST: 21 U/L (ref 15–41)
Albumin: 4.2 g/dL (ref 3.5–5.0)
Alkaline Phosphatase: 84 U/L (ref 38–126)
Anion gap: 9 (ref 5–15)
BUN: 15 mg/dL (ref 8–23)
CO2: 25 mmol/L (ref 22–32)
Calcium: 9.1 mg/dL (ref 8.9–10.3)
Chloride: 104 mmol/L (ref 98–111)
Creatinine, Ser: 0.61 mg/dL (ref 0.44–1.00)
GFR, Estimated: >60 mL/min (ref >60)
Glucose, Bld: 118 mg/dL — ABNORMAL HIGH (ref 70–99)
Potassium: 3.8 mmol/L (ref 3.5–5.1)
Sodium: 138 mmol/L (ref 135–145)
Total Bilirubin: 0.1 mg/dL — ABNORMAL LOW (ref 0.3–1.2)
Total Protein: 7.2 g/dL (ref 6.5–8.1)

## 2020-12-10 MED ORDER — HEPARIN SOD (PORK) LOCK FLUSH 100 UNIT/ML IV SOLN
500.0000 [IU] | Freq: Once | INTRAVENOUS | Status: DC | PRN
  Filled 2020-12-10: qty 5

## 2020-12-10 MED ORDER — SODIUM CHLORIDE 0.9 % IV SOLN
Freq: Once | INTRAVENOUS | Status: AC
  Filled 2020-12-10: qty 250

## 2020-12-10 MED ORDER — SODIUM CHLORIDE 0.9 % IV SOLN
65.0000 mg/m2 | Freq: Once | INTRAVENOUS | Status: AC
  Administered 2020-12-10: 132 mg via INTRAVENOUS
  Filled 2020-12-10: qty 22

## 2020-12-10 MED ORDER — HEPARIN SOD (PORK) LOCK FLUSH 100 UNIT/ML IV SOLN
INTRAVENOUS | Status: AC
  Administered 2020-12-10: 500 [IU]
  Filled 2020-12-10: qty 5

## 2020-12-10 MED ORDER — DEXAMETHASONE SODIUM PHOSPHATE 100 MG/10ML IJ SOLN
20.0000 mg | Freq: Once | INTRAMUSCULAR | Status: AC
  Administered 2020-12-10: 20 mg via INTRAVENOUS
  Filled 2020-12-10: qty 20

## 2020-12-10 MED ORDER — SODIUM CHLORIDE 0.9% FLUSH
10.0000 mL | Freq: Once | INTRAVENOUS | Status: AC
  Administered 2020-12-10: 10 mL via INTRAVENOUS
  Filled 2020-12-10: qty 10

## 2020-12-10 MED ORDER — DIPHENHYDRAMINE HCL 50 MG/ML IJ SOLN
50.0000 mg | Freq: Once | INTRAMUSCULAR | Status: AC
  Administered 2020-12-10: 50 mg via INTRAVENOUS
  Filled 2020-12-10: qty 1

## 2020-12-10 MED ORDER — FAMOTIDINE 20 MG IN NS 100 ML IVPB
20.0000 mg | Freq: Once | INTRAVENOUS | Status: AC
Start: 2020-12-10 — End: 2020-12-10
  Administered 2020-12-10: 20 mg via INTRAVENOUS
  Filled 2020-12-10: qty 20

## 2020-12-10 NOTE — Progress Notes (Signed)
Patient here for oncology follow-up appointment, expresses no complaints or concerns at this time.    

## 2020-12-10 NOTE — Progress Notes (Signed)
HR 112. Per Lanetta Inch., RN, okay to proceed with treatment.

## 2020-12-10 NOTE — Progress Notes (Signed)
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  This was followed by diagnostic mammogram and ultrasound which showed a 7 x 7 x 5 mm hypoechoic mass in the 7 o'clock position of the left breast 8 cm from the nipple.  Ultrasound of the left axilla demonstrates 1 normal-appearing left axillary lymph nodes including 1 with borderline diffuse cortical thickening measuring 3.8 mm in maximum thickness.  No  Lymph nodes with focal or eccentric cortical thickening were seen.  The suspicious breast mass was biopsied and was consistent with triple negative grade 3 invasive mammary carcinoma.   1 cm invasive mammary carcinoma that was grade 2 with negative margins.  1 sentinel lymph node was negative for malignancy.  Patient underwent lumpectomy with sentinel lymph node biopsy.  Final pathology showed 1.1 cm grade 2 invasive mammary carcinoma with negative margins.  1 sentinel lymph node was negative for malignancy.  PT1c  pN 0.  Plan is for adjuvant dose dense AC chemotherapy along with 12 weekly cycles of Taxol     Interval history-neuropathy is overall stable.  She feels well and denies any specific complaints at this time.  ECOG PS- 1 Pain scale- 0  Review of systems- Review of Systems  Constitutional:  Negative for chills,  fever, malaise/fatigue and weight loss.  HENT:  Negative for congestion, ear discharge and nosebleeds.   Eyes:  Negative for blurred vision.  Respiratory:  Negative for cough, hemoptysis, sputum production, shortness of breath and wheezing.   Cardiovascular:  Negative for chest pain, palpitations, orthopnea and claudication.  Gastrointestinal:  Negative for abdominal pain, blood in stool, constipation, diarrhea, heartburn, melena, nausea and vomiting.  Genitourinary:  Negative for dysuria, flank pain, frequency, hematuria and urgency.  Musculoskeletal:  Negative for back pain, joint pain and myalgias.  Skin:  Negative for rash.  Neurological:  Positive for sensory change (Peripheral neuropathy). Negative for dizziness, tingling, focal weakness, seizures, weakness and headaches.  Endo/Heme/Allergies:  Does not bruise/bleed easily.  Psychiatric/Behavioral:  Negative for depression and suicidal ideas. The patient does not have insomnia.       Allergies  Allergen Reactions   Keppra [Levetiracetam]    Tegretol [Carbamazepine]      Past Medical History:  Diagnosis Date   Allergy    AR (allergic rhinitis)    Arthritis    Family history of brain cancer    Family history of lung cancer    Hair loss    Hypertension    x 5years   Migraine    Psoriasis    Seizure ([place])    x 17years   Seizure disorder ([place])    Vitamin D deficiency      Past Surgical History:  Procedure Laterality Date   BREAST BIOPSY Right    benign ? date-before 2006   BREAST BIOPSY Left 05/30/2020   [place] bx, coil marker, path pending  BREAST LUMPECTOMY WITH RADIOACTIVE SEED AND SENTINEL LYMPH NODE BIOPSY Left 06/24/2020   Procedure: LEFT BREAST LUMPECTOMY WITH RADIOACTIVE SEED AND LEFT AXILLARY SENTINEL LYMPH NODE BIOPSY;  Surgeon: Rolm Bookbinder, MD;  Location: [facility];  Service: General;  Laterality: Left;   CESAREAN SECTION     CHOLECYSTECTOMY     KNEE SURGERY     PORTACATH PLACEMENT  Right 06/24/2020   Procedure: INSERTION PORT-A-CATH;  Surgeon: Rolm Bookbinder, MD;  Location: [facility];  Service: General;  Laterality: Right;   TONSILLECTOMY     TUBAL LIGATION      Social History   Socioeconomic History   Marital status: Married    Spouse name: Event organiser   Number of children: 3   Years of education: College   Highest education level: Not on file  Occupational History   Occupation:      Employer: OTHER    Comment: [employer]  Tobacco Use   Smoking status: Never   Smokeless tobacco: Never  Vaping Use   Vaping Use: Never used  Substance and Sexual Activity   Alcohol use: Yes    Comment: Rarely- wine   Drug use: No   Sexual activity: Yes    Birth control/protection: Post-menopausal  Other Topics Concern   Not on file  Social History Narrative   Patient lives at home with her family.   Caffeine Use: 1 cups of coffee daily   Social Determinants of Health   Financial Resource Strain: Not on file  Food Insecurity: Not on file  Transportation Needs: Not on file  Physical Activity: Not on file  Stress: Not on file  Social Connections: Not on file  Intimate Partner Violence: Not on file    Family History  Problem Relation Age of Onset   Brain cancer Mother 34       tumor   Diabetes Father    Heart disease Father    Lung cancer Father    Cancer Paternal Aunt    Diabetes Maternal Grandmother    Stroke Maternal Grandfather    Breast cancer Neg Hx      Current Outpatient Medications:    fluticasone (FLONASE) 50 MCG/ACT nasal spray, Place 2 sprays into both nostrils daily., Disp: , Rfl:    LAMICTAL XR 200 MG TB24, Take 1 tablet (200 mg total) by mouth 2 (two) times daily. Brand Medically Necessary, Disp: 180 tablet, Rfl: 3   lidocaine-prilocaine (EMLA) cream, Apply to affected area once, Disp: 30 g, Rfl: 3   lisinopril-hydrochlorothiazide (ZESTORETIC) 10-12.5 MG tablet, Take 1 tablet by mouth daily. Pt states 1/2  tablet a day, Disp: , Rfl:    loratadine (CLARITIN) 10 MG tablet, Take 10 mg by mouth daily., Disp: , Rfl:    loratadine (CLARITIN) 10 MG tablet, Take 10 mg by mouth daily., Disp: , Rfl:    LORazepam (ATIVAN) 0.5 MG tablet, Take 1 tablet (0.5 mg total) by mouth every 6 (six) hours as needed (Nausea or vomiting). (Patient not taking: No sig reported), Disp: 30 tablet, Rfl: 0   Multiple Vitamin (MULTI-VITAMINS) TABS, Take 1 tablet by mouth daily.  (Patient not taking: No sig reported), Disp: , Rfl:    ondansetron (ZOFRAN) 8 MG tablet, Take 1 tablet (8 mg total) by mouth 2 (two) times daily as needed. Start on the third day after chemotherapy. (Patient not taking: No sig reported), Disp: 30 tablet, Rfl: 1   oxyCODONE (OXY IR/ROXICODONE) 5 MG immediate release tablet, Take 1 tablet (  5 mg total) by mouth every 6 (six) hours as needed. (Patient not taking: No sig reported), Disp: 30 tablet, Rfl: 0   pantoprazole (PROTONIX) 20 MG tablet, Take 20 mg by mouth daily., Disp: , Rfl:    prochlorperazine (COMPAZINE) 10 MG tablet, TAKE 1 TABLET(10 MG) BY MOUTH EVERY 6 HOURS AS NEEDED FOR NAUSEA OR VOMITING (Patient not taking: No sig reported), Disp: 30 tablet, Rfl: 1   traMADol (ULTRAM) 50 MG tablet, Take 1 tablet (50 mg total) by mouth every 6 (six) hours as needed., Disp: 30 tablet, Rfl: 0   traZODone (DESYREL) 50 MG tablet, TAKE 1 TABLET(50 MG) BY MOUTH AT BEDTIME (Patient not taking: No sig reported), Disp: 30 tablet, Rfl: 1 No current facility-administered medications for this visit.  Facility-Administered Medications Ordered in Other Visits:    heparin lock flush 100 unit/mL, 500 Units, Intracatheter, Once PRN, Sindy Guadeloupe, MD  Physical exam:  Vitals:   12/10/20 1040  BP: 121/89  Pulse: (!) 112  Resp: 18  Temp: (!) 97 F (36.1 C)  TempSrc: Tympanic  SpO2: 96%  Weight: 187 lb (84.8 kg)   Physical Exam Cardiovascular:     Rate and Rhythm: Tachycardia present.     Heart sounds: Normal heart  sounds.  Pulmonary:     Effort: Pulmonary effort is normal.  Skin:    General: Skin is warm and dry.  Neurological:     Mental Status: She is alert and oriented to person, place, and time.     CMP Latest Ref Rng & Units 12/10/2020  Glucose 70 - 99 mg/dL 118(H)  BUN 8 - 23 mg/dL 15  Creatinine 0.44 - 1.00 mg/dL 0.61  Sodium 135 - 145 mmol/L 138  Potassium 3.5 - 5.1 mmol/L 3.8  Chloride 98 - 111 mmol/L 104  CO2 22 - 32 mmol/L 25  Calcium 8.9 - 10.3 mg/dL 9.1  Total Protein 6.5 - 8.1 g/dL 7.2  Total Bilirubin 0.3 - 1.2 mg/dL 0.1(L)  Alkaline Phos 38 - 126 U/L 84  AST 15 - 41 U/L 21  ALT 0 - 44 U/L 22   CBC Latest Ref Rng & Units 12/10/2020  WBC 4.0 - 10.5 K/uL 4.2  Hemoglobin 12.0 - 15.0 g/dL 11.8(L)  Hematocrit 36.0 - 46.0 % 36.1  Platelets 150 - 400 K/uL 337     Assessment and plan- Patient is a 65 y.o. female with pathological prognostic stage Ib invasive mammary carcinoma triple negative pT1 cN0 M0 s/p lumpectomy and sentinel lymph node biopsy she is here for on treatment assessment prior to cycle 12 of weekly Taxol chemotherapy  Counts okay to proceed with cycle 12 of weekly Taxol chemotherapy today which will be her last chemotherapy.  She will be seen by radiation oncology in 2 weeks and subsequently go for adjuvant radiation treatment.  Patient has triple negative disease and therefore does not require any hormone therapy following radiation.  In terms of future surveillance she does not require any surveillance scans or routine blood work.  She would require yearly mammograms alone along with yearly breast exams.  We will plan for taking her port out after her radiation treatments are done.  I will see her back in 3 months with port labs CBC with differential and CMP   Visit Diagnosis 1. Encounter for antineoplastic chemotherapy   2. Malignant neoplasm of lower-inner quadrant of left breast in female, estrogen receptor negative ([place])      Dr. [name], MD, MPH [facility]  at  [facility] [phone] 12/10/2020 4:12 PM

## 2020-12-10 NOTE — Telephone Encounter (Signed)
Pt has heart rate 112 and it has been elevated along the way of her treatments. Dr. Janese Banks says yes to the heart rate of 112 and she can get her chemo with that heart rate.

## 2020-12-10 NOTE — Patient Instructions (Signed)
CANCER CENTER [facility]  Discharge Instructions: Thank you for choosing [facility] to provide your oncology and hematology care.  If you have a lab appointment with the Cancer Center, please go directly to the Cancer Center and check in at the registration area.  Wear comfortable clothing and clothing appropriate for easy access to any Portacath or PICC line.   We strive to give you quality time with your provider. You may need to reschedule your appointment if you arrive late (15 or more minutes).  Arriving late affects you and other patients whose appointments are after yours.  Also, if you miss three or more appointments without notifying the office, you may be dismissed from the clinic at the provider's discretion.      For prescription refill requests, have your pharmacy contact our office and allow 72 hours for refills to be completed.    Today you received the following chemotherapy and/or immunotherapy agents Taxol        To help prevent nausea and vomiting after your treatment, we encourage you to take your nausea medication as directed.  BELOW ARE SYMPTOMS THAT SHOULD BE REPORTED IMMEDIATELY: *FEVER GREATER THAN 100.4 F (38 C) OR HIGHER *CHILLS OR SWEATING *NAUSEA AND VOMITING THAT IS NOT CONTROLLED WITH YOUR NAUSEA MEDICATION *UNUSUAL SHORTNESS OF BREATH *UNUSUAL BRUISING OR BLEEDING *URINARY PROBLEMS (pain or burning when urinating, or frequent urination) *BOWEL PROBLEMS (unusual diarrhea, constipation, pain near the anus) TENDERNESS IN MOUTH AND THROAT WITH OR WITHOUT PRESENCE OF ULCERS (sore throat, sores in mouth, or a toothache) UNUSUAL RASH, SWELLING OR PAIN  UNUSUAL VAGINAL DISCHARGE OR ITCHING   Items with * indicate a potential emergency and should be followed up as soon as possible or go to the Emergency Department if any problems should occur.  Please show the CHEMOTHERAPY ALERT CARD or IMMUNOTHERAPY ALERT CARD at check-in to  the Emergency Department and triage nurse.  Should you have questions after your visit or need to cancel or reschedule your appointment, please contact CANCER CENTER [place] [facility]  [phone] and follow the prompts.  Office hours are 8:00 a.m. to 4:30 p.m. Monday - Friday. Please note that voicemails left after 4:00 p.m. may not be returned until the following business day.  We are closed weekends and major holidays. You have access to a nurse at all times for urgent questions. Please call the main number to the clinic [phone] and follow the prompts.  For any non-urgent questions, you may also contact your provider using MyChart. We now offer e-Visits for anyone 18 and older to request care online for non-urgent symptoms. For details visit [email or web address].   Also download the MyChart app! Go to the app store, search "MyChart", open the app, select [facility], and log in with your MyChart username and password.  Due to Covid, a mask is required upon entering the hospital/clinic. If you do not have a mask, one will be given to you upon arrival. For doctor visits, patients may have 1 support person aged 18 or older with them. For treatment visits, patients cannot have anyone with them due to current Covid guidelines and our immunocompromised population.  

## 2020-12-19 ENCOUNTER — Institutional Professional Consult (permissible substitution): Payer: 59 | Admitting: Radiation Oncology

## 2020-12-23 ENCOUNTER — Telehealth: Payer: Self-pay | Admitting: *Deleted

## 2020-12-23 NOTE — Telephone Encounter (Signed)
Called pt to see how she is doing. Her husband was tested positive last week for covid and she tested and was neg. Then she got worse with heart rate elevated, and sat *90. Patient said she went to urgent clinic and she was positive and got monocolonal atb for covid and then cough at night and sputum with little yellow amd mostly white. She is feeling better. Sat 92-98%, heart rate 114 so it is better. She will call us if she needs anything

## 2020-12-24 ENCOUNTER — Encounter: Payer: Self-pay | Admitting: *Deleted

## 2020-12-24 ENCOUNTER — Other Ambulatory Visit: Payer: Self-pay | Admitting: *Deleted

## 2020-12-24 ENCOUNTER — Encounter: Payer: Self-pay | Admitting: Oncology

## 2020-12-24 DIAGNOSIS — Z95828 Presence of other vascular implants and grafts: Secondary | ICD-10-CM

## 2020-12-24 DIAGNOSIS — Z171 Estrogen receptor negative status [ER-]: Secondary | ICD-10-CM

## 2020-12-26 ENCOUNTER — Ambulatory Visit: Payer: 59 | Admitting: Radiation Oncology

## 2020-12-26 DIAGNOSIS — R569 Unspecified convulsions: Secondary | ICD-10-CM | POA: Insufficient documentation

## 2021-01-06 ENCOUNTER — Encounter: Payer: Self-pay | Admitting: Radiation Oncology

## 2021-01-06 ENCOUNTER — Other Ambulatory Visit: Payer: Self-pay

## 2021-01-06 ENCOUNTER — Ambulatory Visit
Admission: RE | Admit: 2021-01-06 | Discharge: 2021-01-06 | Disposition: A | Discharge status: Home / Self Care | Payer: 59 | Source: Ambulatory Visit | Attending: Radiation Oncology | Admitting: Radiation Oncology

## 2021-01-06 VITALS — BP 146/88 | HR 100 | Temp 97.8°F | Wt 191.0 lb

## 2021-01-06 DIAGNOSIS — Z171 Estrogen receptor negative status [ER-]: Secondary | ICD-10-CM

## 2021-01-06 DIAGNOSIS — C50312 Malignant neoplasm of lower-inner quadrant of left female breast: Secondary | ICD-10-CM

## 2021-01-06 NOTE — Consult Note (Signed)
NEW PATIENT EVALUATION  Name: Anna Stewart  MRN: [id number]  Date:   01/06/2021     DOB: 1956/04/06   This 65 y.o. 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  Status post wide local excision and sentinel node biopsy and adjuvant chemotherapy  REFERRING PHYSICIAN: Derinda Late, MD  CHIEF COMPLAINT:  Chief Complaint  Patient presents with   Breast Cancer    Initial consultation    DIAGNOSIS: The encounter diagnosis was Malignant neoplasm of lower-inner quadrant of left breast in female, estrogen receptor negative ([place]).   PREVIOUS INVESTIGATIONS:  Mammogram and ultrasound reviewed Pathology report reviewed Clinical notes reviewed  HPI: Patient is a 65 year old female who presented with abnormal bilateral screening mammograms.  There was a 7 x 7 x 5 mm mass in the left breast 7 o'clock position 8 cm from the nipple.  Ultrasound of the axilla was within normal limits.  Ultrasound-guided biopsy was positive for triple negative invasive mammary carcinoma.  She underwent a wide local excision with again showing invasive mammary carcinoma triple negative grade 2 1.1 cm in greatest dimension with margins negative at 5 mm.  She underwent dose dense AC chemotherapy followed by 12 cycles of Taxol.  She did develop some peripheral neuropathy and is still quite fatigued.  She is now referred to radiation oncology for consideration of treatment.  She specifically denies breast tenderness cough or bone pain.  PLANNED TREATMENT REGIMEN: Hypofractionated left whole breast radiation  PAST MEDICAL HISTORY:  has a past medical history of Allergy, AR (allergic rhinitis), Arthritis, Family history of brain cancer, Family history of lung cancer, Hair loss, Hypertension, Migraine, Psoriasis, Seizure ([place]), Seizure disorder ([place]), and Vitamin D deficiency.    PAST SURGICAL HISTORY:  Past  Surgical History:  Procedure Laterality Date   BREAST BIOPSY Right    benign ? date-before 2006   BREAST BIOPSY Left 05/30/2020   [place] bx, coil marker, path pending   BREAST LUMPECTOMY WITH RADIOACTIVE SEED AND SENTINEL LYMPH NODE BIOPSY Left 06/24/2020   Procedure: LEFT BREAST LUMPECTOMY WITH RADIOACTIVE SEED AND LEFT AXILLARY SENTINEL LYMPH NODE BIOPSY;  Surgeon: Rolm Bookbinder, MD;  Location: [facility];  Service: General;  Laterality: Left;   CESAREAN SECTION     CHOLECYSTECTOMY     KNEE SURGERY     PORTACATH PLACEMENT Right 06/24/2020   Procedure: INSERTION PORT-A-CATH;  Surgeon: Rolm Bookbinder, MD;  Location: [facility];  Service: General;  Laterality: Right;   TONSILLECTOMY     TUBAL LIGATION      FAMILY HISTORY: family history includes Brain cancer (age of onset: 33) in her mother; Cancer in her paternal aunt; Diabetes in her father and maternal grandmother; Heart disease in her father; Lung cancer in her father; Stroke in her maternal grandfather.  SOCIAL HISTORY:  reports that she has never smoked. She has never used smokeless tobacco. She reports current alcohol use. She reports that she does not use drugs.  ALLERGIES: Keppra [levetiracetam] and Tegretol [carbamazepine]  MEDICATIONS:  Current Outpatient Medications  Medication Sig Dispense Refill   fluticasone (FLONASE) 50 MCG/ACT nasal spray Place 2 sprays into both nostrils daily.     LAMICTAL XR 200 MG TB24 Take 1 tablet (200 mg total) by mouth 2 (two) times daily. Brand Medically Necessary 180 tablet 3   lidocaine-prilocaine (EMLA) cream Apply to affected area once 30 g 3   lisinopril-hydrochlorothiazide (  ZESTORETIC) 10-12.5 MG tablet Take 1 tablet by mouth daily. Pt states 1/2 tablet a day     pantoprazole (PROTONIX) 20 MG tablet Take 20 mg by mouth daily.     traMADol (ULTRAM) 50 MG tablet Take 1 tablet (50 mg total) by mouth every 6 (six) hours as needed. 30 tablet 0   LORazepam  (ATIVAN) 0.5 MG tablet Take 1 tablet (0.5 mg total) by mouth every 6 (six) hours as needed (Nausea or vomiting). (Patient not taking: No sig reported) 30 tablet 0   Multiple Vitamin (MULTI-VITAMINS) TABS Take 1 tablet by mouth daily.  (Patient not taking: No sig reported)     ondansetron (ZOFRAN) 8 MG tablet Take 1 tablet (8 mg total) by mouth 2 (two) times daily as needed. Start on the third day after chemotherapy. (Patient not taking: No sig reported) 30 tablet 1   oxyCODONE (OXY IR/ROXICODONE) 5 MG immediate release tablet Take 1 tablet (5 mg total) by mouth every 6 (six) hours as needed. (Patient not taking: No sig reported) 30 tablet 0   prochlorperazine (COMPAZINE) 10 MG tablet TAKE 1 TABLET(10 MG) BY MOUTH EVERY 6 HOURS AS NEEDED FOR NAUSEA OR VOMITING (Patient not taking: No sig reported) 30 tablet 1   traZODone (DESYREL) 50 MG tablet TAKE 1 TABLET(50 MG) BY MOUTH AT BEDTIME (Patient not taking: No sig reported) 30 tablet 1   No current facility-administered medications for this encounter.    ECOG PERFORMANCE STATUS:  0 - Asymptomatic  REVIEW OF SYSTEMS: Patient denies any weight loss, fatigue, weakness, fever, chills or night sweats. Patient denies any loss of vision, blurred vision. Patient denies any ringing  of the ears or hearing loss. No irregular heartbeat. Patient denies heart murmur or history of fainting. Patient denies any chest pain or pain radiating to her upper extremities. Patient denies any shortness of breath, difficulty breathing at night, cough or hemoptysis. Patient denies any swelling in the lower legs. Patient denies any nausea vomiting, vomiting of blood, or coffee ground material in the vomitus. Patient denies any stomach pain. Patient states has had normal bowel movements no significant constipation or diarrhea. Patient denies any dysuria, hematuria or significant nocturia. Patient denies any problems walking, swelling in the joints or loss of balance. Patient denies any  skin changes, loss of hair or loss of weight. Patient denies any excessive worrying or anxiety or significant depression. Patient denies any problems with insomnia. Patient denies excessive thirst, polyuria, polydipsia. Patient denies any swollen glands, patient denies easy bruising or easy bleeding. Patient denies any recent infections, allergies or URI. Patient "s visual fields have not changed significantly in recent time.   PHYSICAL EXAM: BP (!) 146/88   Pulse 100   Temp 97.8 F (36.6 C) (Tympanic)   Wt 191 lb (86.6 kg)   BMI 29.91 kg/m  Left breast is status post wide local excision which is well-healed.  No dominant masses noted in either breast.  No axillary or supraclavicular adenopathy is identified.  Well-developed well-nourished patient in NAD. HEENT reveals PERLA, EOMI, discs not visualized.  Oral cavity is clear. No oral mucosal lesions are identified. Neck is clear without evidence of cervical or supraclavicular adenopathy. Lungs are clear to A&P. Cardiac examination is essentially unremarkable with regular rate and rhythm without murmur rub or thrill. Abdomen is benign with no organomegaly or masses noted. Motor sensory and DTR levels are equal and symmetric in the upper and lower extremities. Cranial nerves II through XII are grossly intact. Proprioception  is intact. No peripheral adenopathy or edema is identified. No motor or sensory levels are noted. Crude visual fields are within normal range.  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  Would also boost her scar another 1000 cGy using electron beam.  Risks and benefits  of treatment occluding skin reaction fatigue alteration of blood counts possible inclusion of superficial lung all were reviewed reviewed in detail with the patient and her husband.  They both seem to comprehend my treatment plan well.  I have personally set up and ordered CT simulation for later this week.  Patient will not be a candidate for antiestrogen therapy based on the triple negative nature of her disease.  They both seem to comprehend my recommendations well.  I would like to take this opportunity to thank you for allowing me to participate in the care of your patient.Noreene Filbert, MD

## 2021-01-09 ENCOUNTER — Ambulatory Visit
Admission: RE | Admit: 2021-01-09 | Discharge: 2021-01-09 | Disposition: A | Discharge status: Home / Self Care | Payer: 59 | Source: Ambulatory Visit | Attending: Radiation Oncology | Admitting: Radiation Oncology

## 2021-01-09 DIAGNOSIS — Z170 Estrogen receptor positive status [ER+]: Secondary | ICD-10-CM | POA: Diagnosis not present

## 2021-01-09 DIAGNOSIS — C50312 Malignant neoplasm of lower-inner quadrant of left female breast: Secondary | ICD-10-CM | POA: Diagnosis present

## 2021-01-09 DIAGNOSIS — Z510 Encounter for antineoplastic radiation therapy: Secondary | ICD-10-CM | POA: Diagnosis present

## 2021-01-13 ENCOUNTER — Other Ambulatory Visit: Payer: Self-pay

## 2021-01-13 ENCOUNTER — Ambulatory Visit: Payer: 59 | Attending: General Surgery

## 2021-01-13 VITALS — Wt 189.1 lb

## 2021-01-13 DIAGNOSIS — Z483 Aftercare following surgery for neoplasm: Secondary | ICD-10-CM | POA: Insufficient documentation

## 2021-01-13 NOTE — Therapy (Signed)
[facility] @ [place], [place], [address] Phone: [phone]   Fax:  [phone]  Physical Therapy Treatment  Patient Details  Name: Anna Stewart MRN: [id number] Date of Birth: 1955-08-27 Referring Provider (PT): Donne Hazel   Encounter Date: 01/13/2021   PT End of Session - 01/13/21 0954     Visit Number 2   # unchanged due to screen only   PT Start Time 0951    PT Stop Time 0956    PT Time Calculation (min) 5 min    Activity Tolerance Patient tolerated treatment well    Behavior During Therapy [facility] for tasks assessed/performed             Past Medical History:  Diagnosis Date   Allergy    AR (allergic rhinitis)    Arthritis    Family history of brain cancer    Family history of lung cancer    Hair loss    Hypertension    x 5years   Migraine    Psoriasis    Seizure ([place])    x 17years   Seizure disorder ([place])    Vitamin D deficiency     Past Surgical History:  Procedure Laterality Date   BREAST BIOPSY Right    benign ? date-before 2006   BREAST BIOPSY Left 05/30/2020   [place] bx, coil marker, path pending   BREAST LUMPECTOMY WITH RADIOACTIVE SEED AND SENTINEL LYMPH NODE BIOPSY Left 06/24/2020   Procedure: LEFT BREAST LUMPECTOMY WITH RADIOACTIVE SEED AND LEFT AXILLARY SENTINEL LYMPH NODE BIOPSY;  Surgeon: Rolm Bookbinder, MD;  Location: [facility];  Service: General;  Laterality: Left;   CESAREAN SECTION     CHOLECYSTECTOMY     KNEE SURGERY     PORTACATH PLACEMENT Right 06/24/2020   Procedure: INSERTION PORT-A-CATH;  Surgeon: Rolm Bookbinder, MD;  Location: [facility];  Service: General;  Laterality: Right;   TONSILLECTOMY     TUBAL LIGATION      Vitals:   01/13/21 0953  Weight: 189 lb 2 oz (85.8 kg)     Subjective Assessment - 01/13/21 0953     Subjective Pt returns for her 3 month L-Dex screen.    Pertinent History L breast cancer triple  negative, 06/24/20 L lumpectomy and SLNB, then chemo, then radiation,                    L-DEX FLOWSHEETS - 01/13/21 0900       L-DEX LYMPHEDEMA SCREENING   Measurement Type Unilateral    L-DEX MEASUREMENT EXTREMITY Upper Extremity    POSITION  Standing    DOMINANT SIDE Right    At Risk Side Left    BASELINE SCORE (UNILATERAL) 4.4    L-DEX SCORE (UNILATERAL) 1.8    VALUE CHANGE (UNILAT) -2.6                                     PT Long Term Goals - 07/08/20 1537       PT LONG TERM GOAL #1   Title Pt will return to baseline ROM measurements and not demonstrate any signs or symptoms of lymphedema.    Time 6    Period Weeks    Status Achieved                   Plan - 01/13/21 1287  Clinical Impression Statement Pt returns for her 3 month L-Dex screen. Her change from baseline of -2.6 is WNLs so no further treatment is required at this time except to cont every 3 month L-Dex screens which pt is agreeable to.    PT Next Visit Plan Cont every 3 month L-Dex screens for up to 2 years from SLNB (~06/16/22)    Consulted and Agree with Plan of Care Patient             Patient will benefit from skilled therapeutic intervention in order to improve the following deficits and impairments:     Visit Diagnosis: Aftercare following surgery for neoplasm     Problem List Patient Active Problem List   Diagnosis Date Noted   Genetic testing 07/31/2020   Family history of brain cancer    Family history of lung cancer    Malignant neoplasm of lower-inner quadrant of left breast in female, estrogen receptor negative ([place]) 06/19/2020   Osteoarthritis 05/16/2020   High risk medication use 05/18/2019   Vitamin D deficiency 05/18/2019   Foot pain, bilateral 03/24/2018   Acute foot pain, left 03/16/2016   History of depression 03/16/2016   Vaginal atrophy 05/14/2015   Menopause 05/14/2015   Migraine headache 05/14/2015   Benign  essential hypertension 10/12/2014   B12 deficiency 08/30/2013   Depression 08/30/2013   Seizure ([place]) 07/10/2013   Plantar fascial fibromatosis 07/10/2013   Other and unspecified ovarian cyst 07/10/2013   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

## 2021-01-14 DIAGNOSIS — Z510 Encounter for antineoplastic radiation therapy: Secondary | ICD-10-CM | POA: Diagnosis not present

## 2021-01-16 ENCOUNTER — Other Ambulatory Visit: Payer: Self-pay | Admitting: *Deleted

## 2021-01-16 DIAGNOSIS — C50312 Malignant neoplasm of lower-inner quadrant of left female breast: Secondary | ICD-10-CM

## 2021-01-20 ENCOUNTER — Ambulatory Visit: Admission: RE | Admit: 2021-01-20 | Payer: 59 | Source: Ambulatory Visit

## 2021-01-20 DIAGNOSIS — Z510 Encounter for antineoplastic radiation therapy: Secondary | ICD-10-CM | POA: Diagnosis not present

## 2021-01-21 ENCOUNTER — Ambulatory Visit
Admission: RE | Admit: 2021-01-21 | Discharge: 2021-01-21 | Disposition: A | Discharge status: Home / Self Care | Payer: 59 | Source: Ambulatory Visit | Attending: Radiation Oncology | Admitting: Radiation Oncology

## 2021-01-21 DIAGNOSIS — Z510 Encounter for antineoplastic radiation therapy: Secondary | ICD-10-CM | POA: Diagnosis not present

## 2021-01-22 ENCOUNTER — Ambulatory Visit
Admission: RE | Admit: 2021-01-22 | Discharge: 2021-01-22 | Disposition: A | Discharge status: Home / Self Care | Payer: 59 | Source: Ambulatory Visit | Attending: Radiation Oncology | Admitting: Radiation Oncology

## 2021-01-22 DIAGNOSIS — Z510 Encounter for antineoplastic radiation therapy: Secondary | ICD-10-CM | POA: Diagnosis not present

## 2021-01-23 ENCOUNTER — Ambulatory Visit
Admission: RE | Admit: 2021-01-23 | Discharge: 2021-01-23 | Disposition: A | Discharge status: Home / Self Care | Payer: 59 | Source: Ambulatory Visit | Attending: Radiation Oncology | Admitting: Radiation Oncology

## 2021-01-23 DIAGNOSIS — Z510 Encounter for antineoplastic radiation therapy: Secondary | ICD-10-CM | POA: Diagnosis not present

## 2021-01-24 ENCOUNTER — Ambulatory Visit
Admission: RE | Admit: 2021-01-24 | Discharge: 2021-01-24 | Disposition: A | Discharge status: Home / Self Care | Payer: 59 | Source: Ambulatory Visit | Attending: Radiation Oncology | Admitting: Radiation Oncology

## 2021-01-24 DIAGNOSIS — Z510 Encounter for antineoplastic radiation therapy: Secondary | ICD-10-CM | POA: Diagnosis not present

## 2021-01-27 ENCOUNTER — Ambulatory Visit
Admission: RE | Admit: 2021-01-27 | Discharge: 2021-01-27 | Disposition: A | Discharge status: Home / Self Care | Payer: 59 | Source: Ambulatory Visit | Attending: Radiation Oncology | Admitting: Radiation Oncology

## 2021-01-27 ENCOUNTER — Encounter: Payer: Self-pay | Admitting: Oncology

## 2021-01-27 DIAGNOSIS — Z510 Encounter for antineoplastic radiation therapy: Secondary | ICD-10-CM | POA: Diagnosis not present

## 2021-01-28 ENCOUNTER — Ambulatory Visit
Admission: RE | Admit: 2021-01-28 | Discharge: 2021-01-28 | Disposition: A | Discharge status: Home / Self Care | Payer: 59 | Source: Ambulatory Visit | Attending: Radiation Oncology | Admitting: Radiation Oncology

## 2021-01-28 DIAGNOSIS — Z510 Encounter for antineoplastic radiation therapy: Secondary | ICD-10-CM | POA: Diagnosis not present

## 2021-01-29 ENCOUNTER — Ambulatory Visit
Admission: RE | Admit: 2021-01-29 | Discharge: 2021-01-29 | Disposition: A | Discharge status: Home / Self Care | Payer: 59 | Source: Ambulatory Visit | Attending: Radiation Oncology | Admitting: Radiation Oncology

## 2021-01-29 DIAGNOSIS — Z510 Encounter for antineoplastic radiation therapy: Secondary | ICD-10-CM | POA: Diagnosis not present

## 2021-01-30 ENCOUNTER — Ambulatory Visit
Admission: RE | Admit: 2021-01-30 | Discharge: 2021-01-30 | Disposition: A | Discharge status: Home / Self Care | Payer: 59 | Source: Ambulatory Visit | Attending: Radiation Oncology | Admitting: Radiation Oncology

## 2021-01-30 DIAGNOSIS — Z510 Encounter for antineoplastic radiation therapy: Secondary | ICD-10-CM | POA: Diagnosis not present

## 2021-01-31 ENCOUNTER — Ambulatory Visit
Admission: RE | Admit: 2021-01-31 | Discharge: 2021-01-31 | Disposition: A | Discharge status: Home / Self Care | Payer: 59 | Source: Ambulatory Visit | Attending: Radiation Oncology | Admitting: Radiation Oncology

## 2021-01-31 DIAGNOSIS — Z510 Encounter for antineoplastic radiation therapy: Secondary | ICD-10-CM | POA: Diagnosis not present

## 2021-02-03 ENCOUNTER — Ambulatory Visit
Admission: RE | Admit: 2021-02-03 | Discharge: 2021-02-03 | Disposition: A | Discharge status: Home / Self Care | Payer: 59 | Source: Ambulatory Visit | Attending: Radiation Oncology | Admitting: Radiation Oncology

## 2021-02-03 DIAGNOSIS — Z510 Encounter for antineoplastic radiation therapy: Secondary | ICD-10-CM | POA: Diagnosis not present

## 2021-02-04 ENCOUNTER — Inpatient Hospital Stay: Payer: 59

## 2021-02-04 ENCOUNTER — Ambulatory Visit
Admission: RE | Admit: 2021-02-04 | Discharge: 2021-02-04 | Disposition: A | Discharge status: Home / Self Care | Payer: 59 | Source: Ambulatory Visit | Attending: Radiation Oncology | Admitting: Radiation Oncology

## 2021-02-04 ENCOUNTER — Other Ambulatory Visit: Payer: Self-pay

## 2021-02-04 DIAGNOSIS — Z853 Personal history of malignant neoplasm of breast: Secondary | ICD-10-CM | POA: Insufficient documentation

## 2021-02-04 DIAGNOSIS — Z923 Personal history of irradiation: Secondary | ICD-10-CM | POA: Insufficient documentation

## 2021-02-04 DIAGNOSIS — Z170 Estrogen receptor positive status [ER+]: Secondary | ICD-10-CM | POA: Insufficient documentation

## 2021-02-04 DIAGNOSIS — Z9221 Personal history of antineoplastic chemotherapy: Secondary | ICD-10-CM | POA: Insufficient documentation

## 2021-02-04 DIAGNOSIS — C50312 Malignant neoplasm of lower-inner quadrant of left female breast: Secondary | ICD-10-CM | POA: Diagnosis present

## 2021-02-04 DIAGNOSIS — Z171 Estrogen receptor negative status [ER-]: Secondary | ICD-10-CM

## 2021-02-04 DIAGNOSIS — Z510 Encounter for antineoplastic radiation therapy: Secondary | ICD-10-CM | POA: Diagnosis present

## 2021-02-04 LAB — CBC
HCT: 38.9 % (ref 36.0–46.0)
Hemoglobin: 13.1 g/dL (ref 12.0–15.0)
MCH: 30.8 pg (ref 26.0–34.0)
MCHC: 33.7 g/dL (ref 30.0–36.0)
MCV: 91.5 fL (ref 80.0–100.0)
Platelets: 246 10*3/uL (ref 150–400)
RBC: 4.25 MIL/uL (ref 3.87–5.11)
RDW: 13.4 % (ref 11.5–15.5)
WBC: 5.1 10*3/uL (ref 4.0–10.5)
nRBC: 0 % (ref 0.0–0.2)

## 2021-02-05 ENCOUNTER — Ambulatory Visit
Admission: RE | Admit: 2021-02-05 | Discharge: 2021-02-05 | Disposition: A | Discharge status: Home / Self Care | Payer: 59 | Source: Ambulatory Visit | Attending: Radiation Oncology | Admitting: Radiation Oncology

## 2021-02-05 DIAGNOSIS — Z510 Encounter for antineoplastic radiation therapy: Secondary | ICD-10-CM | POA: Diagnosis not present

## 2021-02-06 ENCOUNTER — Ambulatory Visit
Admission: RE | Admit: 2021-02-06 | Discharge: 2021-02-06 | Disposition: A | Discharge status: Home / Self Care | Payer: 59 | Source: Ambulatory Visit | Attending: Radiation Oncology | Admitting: Radiation Oncology

## 2021-02-06 DIAGNOSIS — Z510 Encounter for antineoplastic radiation therapy: Secondary | ICD-10-CM | POA: Diagnosis not present

## 2021-02-07 ENCOUNTER — Encounter: Payer: Self-pay | Admitting: Oncology

## 2021-02-07 ENCOUNTER — Ambulatory Visit
Admission: RE | Admit: 2021-02-07 | Discharge: 2021-02-07 | Disposition: A | Discharge status: Home / Self Care | Payer: 59 | Source: Ambulatory Visit | Attending: Radiation Oncology | Admitting: Radiation Oncology

## 2021-02-07 DIAGNOSIS — Z510 Encounter for antineoplastic radiation therapy: Secondary | ICD-10-CM | POA: Diagnosis not present

## 2021-02-07 NOTE — Telephone Encounter (Signed)
Updated from [place] labs to regular labs. Spoke with patient to confirm.

## 2021-02-10 ENCOUNTER — Ambulatory Visit
Admission: RE | Admit: 2021-02-10 | Discharge: 2021-02-10 | Disposition: A | Discharge status: Home / Self Care | Payer: 59 | Source: Ambulatory Visit | Attending: Radiation Oncology | Admitting: Radiation Oncology

## 2021-02-10 DIAGNOSIS — Z510 Encounter for antineoplastic radiation therapy: Secondary | ICD-10-CM | POA: Diagnosis not present

## 2021-02-11 ENCOUNTER — Ambulatory Visit
Admission: RE | Admit: 2021-02-11 | Discharge: 2021-02-11 | Disposition: A | Discharge status: Home / Self Care | Payer: 59 | Source: Ambulatory Visit | Attending: Radiation Oncology | Admitting: Radiation Oncology

## 2021-02-11 DIAGNOSIS — Z510 Encounter for antineoplastic radiation therapy: Secondary | ICD-10-CM | POA: Diagnosis not present

## 2021-02-12 ENCOUNTER — Ambulatory Visit
Admission: RE | Admit: 2021-02-12 | Discharge: 2021-02-12 | Disposition: A | Discharge status: Home / Self Care | Payer: 59 | Source: Ambulatory Visit | Attending: Radiation Oncology | Admitting: Radiation Oncology

## 2021-02-12 DIAGNOSIS — Z510 Encounter for antineoplastic radiation therapy: Secondary | ICD-10-CM | POA: Diagnosis not present

## 2021-02-13 ENCOUNTER — Ambulatory Visit
Admission: RE | Admit: 2021-02-13 | Discharge: 2021-02-13 | Disposition: A | Discharge status: Home / Self Care | Payer: 59 | Source: Ambulatory Visit | Attending: Radiation Oncology | Admitting: Radiation Oncology

## 2021-02-13 DIAGNOSIS — Z510 Encounter for antineoplastic radiation therapy: Secondary | ICD-10-CM | POA: Diagnosis not present

## 2021-02-14 ENCOUNTER — Ambulatory Visit
Admission: RE | Admit: 2021-02-14 | Discharge: 2021-02-14 | Disposition: A | Discharge status: Home / Self Care | Payer: 59 | Source: Ambulatory Visit | Attending: Radiation Oncology | Admitting: Radiation Oncology

## 2021-02-14 DIAGNOSIS — Z510 Encounter for antineoplastic radiation therapy: Secondary | ICD-10-CM | POA: Diagnosis not present

## 2021-02-17 ENCOUNTER — Ambulatory Visit
Admission: RE | Admit: 2021-02-17 | Discharge: 2021-02-17 | Disposition: A | Discharge status: Home / Self Care | Payer: 59 | Source: Ambulatory Visit | Attending: Radiation Oncology | Admitting: Radiation Oncology

## 2021-02-17 DIAGNOSIS — Z510 Encounter for antineoplastic radiation therapy: Secondary | ICD-10-CM | POA: Diagnosis not present

## 2021-02-18 ENCOUNTER — Ambulatory Visit
Admission: RE | Admit: 2021-02-18 | Discharge: 2021-02-18 | Disposition: A | Discharge status: Home / Self Care | Payer: 59 | Source: Ambulatory Visit | Attending: Radiation Oncology | Admitting: Radiation Oncology

## 2021-02-18 DIAGNOSIS — Z510 Encounter for antineoplastic radiation therapy: Secondary | ICD-10-CM | POA: Diagnosis not present

## 2021-03-07 ENCOUNTER — Encounter (HOSPITAL_BASED_OUTPATIENT_CLINIC_OR_DEPARTMENT_OTHER): Payer: Self-pay | Admitting: General Surgery

## 2021-03-07 ENCOUNTER — Other Ambulatory Visit: Payer: Self-pay

## 2021-03-11 ENCOUNTER — Other Ambulatory Visit: Payer: Self-pay | Admitting: General Surgery

## 2021-03-11 ENCOUNTER — Encounter: Payer: Self-pay | Admitting: Oncology

## 2021-03-11 ENCOUNTER — Inpatient Hospital Stay (HOSPITAL_BASED_OUTPATIENT_CLINIC_OR_DEPARTMENT_OTHER): Payer: 59 | Admitting: Oncology

## 2021-03-11 ENCOUNTER — Other Ambulatory Visit: Payer: Self-pay

## 2021-03-11 ENCOUNTER — Other Ambulatory Visit: Payer: Self-pay | Admitting: *Deleted

## 2021-03-11 ENCOUNTER — Inpatient Hospital Stay: Payer: 59 | Attending: Oncology

## 2021-03-11 VITALS — BP 135/96 | HR 91 | Temp 97.8°F | Resp 18 | Wt 192.1 lb

## 2021-03-11 DIAGNOSIS — Z08 Encounter for follow-up examination after completed treatment for malignant neoplasm: Secondary | ICD-10-CM | POA: Diagnosis not present

## 2021-03-11 DIAGNOSIS — Z853 Personal history of malignant neoplasm of breast: Secondary | ICD-10-CM

## 2021-03-11 DIAGNOSIS — Z801 Family history of malignant neoplasm of trachea, bronchus and lung: Secondary | ICD-10-CM | POA: Diagnosis not present

## 2021-03-11 DIAGNOSIS — Z808 Family history of malignant neoplasm of other organs or systems: Secondary | ICD-10-CM | POA: Insufficient documentation

## 2021-03-11 DIAGNOSIS — Z171 Estrogen receptor negative status [ER-]: Secondary | ICD-10-CM | POA: Insufficient documentation

## 2021-03-11 DIAGNOSIS — I10 Essential (primary) hypertension: Secondary | ICD-10-CM | POA: Insufficient documentation

## 2021-03-11 DIAGNOSIS — C50512 Malignant neoplasm of lower-outer quadrant of left female breast: Secondary | ICD-10-CM

## 2021-03-11 DIAGNOSIS — C50312 Malignant neoplasm of lower-inner quadrant of left female breast: Secondary | ICD-10-CM | POA: Diagnosis not present

## 2021-03-11 LAB — COMPREHENSIVE METABOLIC PANEL
ALT: 24 U/L (ref 0–44)
AST: 20 U/L (ref 15–41)
Albumin: 4.2 g/dL (ref 3.5–5.0)
Alkaline Phosphatase: 90 U/L (ref 38–126)
Anion gap: 10 (ref 5–15)
BUN: 15 mg/dL (ref 8–23)
CO2: 28 mmol/L (ref 22–32)
Calcium: 9.6 mg/dL (ref 8.9–10.3)
Chloride: 101 mmol/L (ref 98–111)
Creatinine, Ser: 0.71 mg/dL (ref 0.44–1.00)
GFR, Estimated: >60 mL/min (ref >60)
Glucose, Bld: 110 mg/dL — ABNORMAL HIGH (ref 70–99)
Potassium: 4.5 mmol/L (ref 3.5–5.1)
Sodium: 139 mmol/L (ref 135–145)
Total Bilirubin: 0.3 mg/dL (ref 0.3–1.2)
Total Protein: 7.1 g/dL (ref 6.5–8.1)

## 2021-03-11 LAB — CBC WITH DIFFERENTIAL/PLATELET
Abs Immature Granulocytes: 0 10*3/uL (ref 0.00–0.07)
Basophils Absolute: 0 10*3/uL (ref 0.0–0.1)
Basophils Relative: 1 %
Eosinophils Absolute: 0.2 10*3/uL (ref 0.0–0.5)
Eosinophils Relative: 4 %
HCT: 40.3 % (ref 36.0–46.0)
Hemoglobin: 13.2 g/dL (ref 12.0–15.0)
Immature Granulocytes: 0 %
Lymphocytes Relative: 23 %
Lymphs Abs: 1 10*3/uL (ref 0.7–4.0)
MCH: 29.8 pg (ref 26.0–34.0)
MCHC: 32.8 g/dL (ref 30.0–36.0)
MCV: 91 fL (ref 80.0–100.0)
Monocytes Absolute: 0.5 10*3/uL (ref 0.1–1.0)
Monocytes Relative: 10 %
Neutro Abs: 2.9 10*3/uL (ref 1.7–7.7)
Neutrophils Relative %: 62 %
Platelets: 240 10*3/uL (ref 150–400)
RBC: 4.43 MIL/uL (ref 3.87–5.11)
RDW: 13.5 % (ref 11.5–15.5)
WBC: 4.5 10*3/uL (ref 4.0–10.5)
nRBC: 0 % (ref 0.0–0.2)

## 2021-03-11 NOTE — Progress Notes (Signed)
Pt states she is having a hard time getting up from chairs and her bed. Had some injections i nher knees; does not know if she is just weak with her chemo and radiation txs. States she does better during the morning but throughout the day it gets harder and harder.

## 2021-03-11 NOTE — Progress Notes (Signed)
Hematology/Oncology Consult note [facility]  Telephone:([phone] [phone]  Patient Care Team: Derinda Late, MD as PCP - General (Family Medicine) Theodore Demark, RN as Oncology Nurse Navigator   Name of the patient: Anna Stewart  [id number]  [birth year]   Date of visit: 03/11/21  Diagnosis- pathological prognostic stage Ib invasive mammary carcinoma pT1 cpN0 cM0 ER/PR negative and HER-2 negative  Chief complaint/ Reason for visit-routine follow-up of breast cancer  Heme/Onc history: Patient is a 65 year old female who underwent a routine screening bilateral mammogram on 04/25/2020 which suggested a possible mass in the left breast.  This was followed by diagnostic mammogram and ultrasound which showed a 7 x 7 x 5 mm hypoechoic mass in the 7 o'clock position of the left breast 8 cm from the nipple.  Ultrasound of the left axilla demonstrates 1 normal-appearing left axillary lymph nodes including 1 with borderline diffuse cortical thickening measuring 3.8 mm in maximum thickness.  No  Lymph nodes with focal or eccentric cortical thickening were seen.  The suspicious breast mass was biopsied and was consistent with triple negative grade 3 invasive mammary carcinoma.   1 cm invasive mammary carcinoma that was grade 2 with negative margins.  1 sentinel lymph node was negative for malignancy.  Patient underwent lumpectomy with sentinel lymph node biopsy.  Final pathology showed 1.1 cm grade 2 invasive mammary carcinoma with negative margins.  1 sentinel lymph node was negative for malignancy.  PT1c  pN 0.    Patient completed adjuvant AC Taxol chemotherapy on 12/10/2020.  She also completed adjuvant radiation treatment.   Interval history-patient has very minimal neuropathy in her hands and feet which she had even prior to starting chemotherapy and is no worse presently.  She still reports ongoing fatigue.  She has not restarted any exercise at this  time.  Port will be coming out next week.  She has an area of radiation dermatitis in her left posterior axilla.  It is healing slowly  ECOG PS- 1 Pain scale- 0   Review of systems- Review of Systems  Constitutional:  Negative for chills, fever, malaise/fatigue and weight loss.  HENT:  Negative for congestion, ear discharge and nosebleeds.   Eyes:  Negative for blurred vision.  Respiratory:  Negative for cough, hemoptysis, sputum production, shortness of breath and wheezing.   Cardiovascular:  Negative for chest pain, palpitations, orthopnea and claudication.  Gastrointestinal:  Negative for abdominal pain, blood in stool, constipation, diarrhea, heartburn, melena, nausea and vomiting.  Genitourinary:  Negative for dysuria, flank pain, frequency, hematuria and urgency.  Musculoskeletal:  Negative for back pain, joint pain and myalgias.  Skin:  Negative for rash.  Neurological:  Negative for dizziness, tingling, focal weakness, seizures, weakness and headaches.  Endo/Heme/Allergies:  Does not bruise/bleed easily.  Psychiatric/Behavioral:  Negative for depression and suicidal ideas. The patient does not have insomnia.       Allergies  Allergen Reactions   Keppra [Levetiracetam]    Tegretol [Carbamazepine]      Past Medical History:  Diagnosis Date   Allergy    AR (allergic rhinitis)    Arthritis    Cancer ([place]) 06/2020   right breast [facility]   Family history of brain cancer    Family history of lung cancer    Hair loss    Hypertension    x 5years   Migraine    Psoriasis    Seizure ([place])    x 17years   Seizure disorder (  [place])    Vitamin D deficiency      Past Surgical History:  Procedure Laterality Date   BREAST BIOPSY Right    benign ? date-before 2006   BREAST BIOPSY Left 05/30/2020   [place] bx, coil marker, path pending   BREAST LUMPECTOMY WITH RADIOACTIVE SEED AND SENTINEL LYMPH NODE BIOPSY Left 06/24/2020   Procedure: LEFT BREAST LUMPECTOMY WITH RADIOACTIVE SEED AND  LEFT AXILLARY SENTINEL LYMPH NODE BIOPSY;  Surgeon: Rolm Bookbinder, MD;  Location: [facility];  Service: General;  Laterality: Left;   CESAREAN SECTION     CHOLECYSTECTOMY     KNEE SURGERY     PORTACATH PLACEMENT Right 06/24/2020   Procedure: INSERTION PORT-A-CATH;  Surgeon: Rolm Bookbinder, MD;  Location: [facility];  Service: General;  Laterality: Right;   TONSILLECTOMY     TUBAL LIGATION      Social History   Socioeconomic History   Marital status: Married    Spouse name: Event organiser   Number of children: 3   Years of education: College   Highest education level: Not on file  Occupational History   Occupation:      Employer: OTHER    Comment: [employer]  Tobacco Use   Smoking status: Never   Smokeless tobacco: Never  Vaping Use   Vaping Use: Never used  Substance and Sexual Activity   Alcohol use: Yes    Comment: Rarely- wine   Drug use: No   Sexual activity: Yes    Birth control/protection: Post-menopausal  Other Topics Concern   Not on file  Social History Narrative   Patient lives at home with her family.   Caffeine Use: 1 cups of coffee daily   Social Determinants of Health   Financial Resource Strain: Not on file  Food Insecurity: Not on file  Transportation Needs: Not on file  Physical Activity: Not on file  Stress: Not on file  Social Connections: Not on file  Intimate Partner Violence: Not on file    Family History  Problem Relation Age of Onset   Brain cancer Mother 25       tumor   Diabetes Father    Heart disease Father    Lung cancer Father    Cancer Paternal Aunt    Diabetes Maternal Grandmother    Stroke Maternal Grandfather    Breast cancer Neg Hx      Current Outpatient Medications:    diclofenac Sodium (VOLTAREN) 1 % GEL, SMARTSIG:2 Gram(s) Topical 4 Times Daily PRN, Disp: , Rfl:    fluticasone (FLONASE) 50 MCG/ACT nasal spray, Place 2 sprays into both nostrils daily., Disp: , Rfl:     LAMICTAL XR 200 MG TB24, Take 1 tablet (200 mg total) by mouth 2 (two) times daily. Brand Medically Necessary, Disp: 180 tablet, Rfl: 3   lidocaine-prilocaine (EMLA) cream, Apply to affected area once, Disp: 30 g, Rfl: 3   lisinopril-hydrochlorothiazide (ZESTORETIC) 10-12.5 MG tablet, Take 1 tablet by mouth daily. Pt states 1/2 tablet a day, Disp: , Rfl:    pantoprazole (PROTONIX) 20 MG tablet, Take 20 mg by mouth daily., Disp: , Rfl:    LORazepam (ATIVAN) 0.5 MG tablet, Take 1 tablet (0.5 mg total) by mouth every 6 (six) hours as needed (Nausea or vomiting). (Patient not taking: Reported on 10/08/2020), Disp: 30 tablet, Rfl: 0   ondansetron (ZOFRAN) 8 MG tablet, Take 1 tablet (8 mg total) by mouth 2 (two) times daily as needed. Start on the third day  after chemotherapy. (Patient not taking: Reported on 07/15/2020), Disp: 30 tablet, Rfl: 1   pantoprazole (PROTONIX) 40 MG tablet, Take 40 mg by mouth daily. (Patient not taking: Reported on 03/11/2021), Disp: , Rfl:    prochlorperazine (COMPAZINE) 10 MG tablet, TAKE 1 TABLET(10 MG) BY MOUTH EVERY 6 HOURS AS NEEDED FOR NAUSEA OR VOMITING (Patient not taking: Reported on 10/08/2020), Disp: 30 tablet, Rfl: 1   traMADol (ULTRAM) 50 MG tablet, Take 1 tablet (50 mg total) by mouth every 6 (six) hours as needed. (Patient not taking: Reported on 03/11/2021), Disp: 30 tablet, Rfl: 0  Physical exam:  Vitals:   03/11/21 1107  BP: (!) 135/96  Pulse: 91  Resp: 18  Temp: 97.8 F (36.6 C)  SpO2: 96%  Weight: 192 lb 1.6 oz (87.1 kg)   Physical Exam Constitutional:      General: She is not in acute distress. Cardiovascular:     Rate and Rhythm: Normal rate and regular rhythm.     Heart sounds: Normal heart sounds.  Pulmonary:     Effort: Pulmonary effort is normal.     Breath sounds: Normal breath sounds.  Abdominal:     General: Bowel sounds are normal.     Palpations: Abdomen is soft.  Skin:    General: Skin is warm and dry.  Neurological:     Mental  Status: She is alert and oriented to person, place, and time.    Breast exam was performed in seated and lying down position. Patient is status post left lumpectomy with a well-healed surgical scar. No evidence of any palpable masses. No evidence of axillary adenopathy. No evidence of any palpable masses or lumps in the right breast. No evidence of right axillary adenopathy.  There is a roughly 3 cm appearing area in her left posterior axilla which has an open ulceration and appears to be healing from recent radiation treatment    CMP Latest Ref Rng & Units 03/11/2021  Glucose 70 - 99 mg/dL 110(H)  BUN 8 - 23 mg/dL 15  Creatinine 0.44 - 1.00 mg/dL 0.71  Sodium 135 - 145 mmol/L 139  Potassium 3.5 - 5.1 mmol/L 4.5  Chloride 98 - 111 mmol/L 101  CO2 22 - 32 mmol/L 28  Calcium 8.9 - 10.3 mg/dL 9.6  Total Protein 6.5 - 8.1 g/dL 7.1  Total Bilirubin 0.3 - 1.2 mg/dL 0.3  Alkaline Phos 38 - 126 U/L 90  AST 15 - 41 U/L 20  ALT 0 - 44 U/L 24   CBC Latest Ref Rng & Units 03/11/2021  WBC 4.0 - 10.5 K/uL 4.5  Hemoglobin 12.0 - 15.0 g/dL 13.2  Hematocrit 36.0 - 46.0 % 40.3  Platelets 150 - 400 K/uL 240    Assessment and plan- Patient is a 66 y.o. female with pathological prognostic stage Ib invasive mammary carcinoma triple negative pT1 cN0 M0 s/p lumpectomy and sentinel lymph node biopsy.  She has completed adjuvant AC Taxol chemotherapy and adjuvant radiation treatment and is here for routine follow-up  Clinically patient is doing well with no concerning signs and symptoms of recurrence based on today's exam.  She has a small area of radiation dermatitis which appears to be healing.  She would be due for a repeat diagnostic mammogram in January 2023 which I will schedule.  Port will be taken out next week as well.  I will see her back in 4 months no labs.  I have encouraged patient to start some low intensity exercises to combat  fatigue.  No role for tumor marker testing or surveillance imaging  other than mammograms for routine surveillance of breast cancer   Visit Diagnosis 1. Encounter for follow-up surveillance of breast cancer      Dr. [name], MD, MPH [facility] at [facility] [phone] 03/11/2021 1:52 PM

## 2021-03-14 NOTE — Progress Notes (Signed)
Sent text reminding pt to come and pick up drink and soap.

## 2021-03-17 ENCOUNTER — Ambulatory Visit (HOSPITAL_BASED_OUTPATIENT_CLINIC_OR_DEPARTMENT_OTHER): Payer: 59 | Admitting: Certified Registered"

## 2021-03-17 ENCOUNTER — Encounter (HOSPITAL_BASED_OUTPATIENT_CLINIC_OR_DEPARTMENT_OTHER)
Admission: RE | Disposition: A | Discharge status: Home / Self Care | Payer: Self-pay | Source: Home / Self Care | Attending: General Surgery

## 2021-03-17 ENCOUNTER — Encounter (HOSPITAL_BASED_OUTPATIENT_CLINIC_OR_DEPARTMENT_OTHER): Payer: Self-pay | Admitting: General Surgery

## 2021-03-17 ENCOUNTER — Other Ambulatory Visit: Payer: Self-pay

## 2021-03-17 ENCOUNTER — Ambulatory Visit (HOSPITAL_BASED_OUTPATIENT_CLINIC_OR_DEPARTMENT_OTHER)
Admission: RE | Admit: 2021-03-17 | Discharge: 2021-03-17 | Disposition: A | Discharge status: Home / Self Care | Payer: 59 | Attending: General Surgery | Admitting: General Surgery

## 2021-03-17 DIAGNOSIS — Z452 Encounter for adjustment and management of vascular access device: Secondary | ICD-10-CM | POA: Insufficient documentation

## 2021-03-17 DIAGNOSIS — I10 Essential (primary) hypertension: Secondary | ICD-10-CM | POA: Diagnosis not present

## 2021-03-17 DIAGNOSIS — R569 Unspecified convulsions: Secondary | ICD-10-CM | POA: Diagnosis not present

## 2021-03-17 DIAGNOSIS — Z853 Personal history of malignant neoplasm of breast: Secondary | ICD-10-CM | POA: Diagnosis not present

## 2021-03-17 HISTORY — PX: PORT-A-CATH REMOVAL: SHX5289

## 2021-03-17 SURGERY — REMOVAL PORT-A-CATH
Anesthesia: Monitor Anesthesia Care | Site: Chest | Laterality: Right

## 2021-03-17 MED ORDER — HYDROMORPHONE HCL 1 MG/ML IJ SOLN: 0.2500 mg | INTRAMUSCULAR | Status: DC | PRN

## 2021-03-17 MED ORDER — ENSURE PRE-SURGERY PO LIQD: 296.0000 mL | Freq: Once | ORAL | Status: DC

## 2021-03-17 MED ORDER — PROPOFOL 500 MG/50ML IV EMUL
INTRAVENOUS | Status: DC | PRN
  Administered 2021-03-17: 75 ug/kg/min via INTRAVENOUS

## 2021-03-17 MED ORDER — MIDAZOLAM HCL 5 MG/5ML IJ SOLN
INTRAMUSCULAR | Status: DC | PRN
  Administered 2021-03-17: 2 mg via INTRAVENOUS

## 2021-03-17 MED ORDER — BUPIVACAINE HCL (PF) 0.25 % IJ SOLN
INTRAMUSCULAR | Status: DC | PRN
  Administered 2021-03-17: 8 mL

## 2021-03-17 MED ORDER — FENTANYL CITRATE (PF) 100 MCG/2ML IJ SOLN
INTRAMUSCULAR | Status: AC
  Filled 2021-03-17: qty 2

## 2021-03-17 MED ORDER — LACTATED RINGERS IV SOLN: INTRAVENOUS | Status: DC

## 2021-03-17 MED ORDER — ACETAMINOPHEN 500 MG PO TABS
1000.0000 mg | ORAL_TABLET | ORAL | Status: AC
  Administered 2021-03-17: 1000 mg via ORAL

## 2021-03-17 MED ORDER — LIDOCAINE HCL (CARDIAC) PF 100 MG/5ML IV SOSY
PREFILLED_SYRINGE | INTRAVENOUS | Status: DC | PRN
  Administered 2021-03-17: 60 mg via INTRAVENOUS

## 2021-03-17 MED ORDER — OXYCODONE HCL 5 MG PO TABS: 5.0000 mg | ORAL_TABLET | Freq: Once | ORAL | Status: DC | PRN

## 2021-03-17 MED ORDER — MIDAZOLAM HCL 2 MG/2ML IJ SOLN
INTRAMUSCULAR | Status: AC
  Filled 2021-03-17: qty 2

## 2021-03-17 MED ORDER — FENTANYL CITRATE (PF) 100 MCG/2ML IJ SOLN
INTRAMUSCULAR | Status: DC | PRN
  Administered 2021-03-17: 100 ug via INTRAVENOUS

## 2021-03-17 MED ORDER — PROMETHAZINE HCL 25 MG/ML IJ SOLN: 6.2500 mg | INTRAMUSCULAR | Status: DC | PRN

## 2021-03-17 MED ORDER — CHLORHEXIDINE GLUCONATE CLOTH 2 % EX PADS: 6.0000 | MEDICATED_PAD | Freq: Once | CUTANEOUS | Status: DC

## 2021-03-17 MED ORDER — OXYCODONE HCL 5 MG/5ML PO SOLN: 5.0000 mg | Freq: Once | ORAL | Status: DC | PRN

## 2021-03-17 MED ORDER — ACETAMINOPHEN 500 MG PO TABS
ORAL_TABLET | ORAL | Status: AC
  Filled 2021-03-17: qty 2

## 2021-03-17 MED ORDER — ONDANSETRON HCL 4 MG/2ML IJ SOLN
INTRAMUSCULAR | Status: DC | PRN
  Administered 2021-03-17: 4 mg via INTRAVENOUS

## 2021-03-17 SURGICAL SUPPLY — 31 items
ADH SKN CLS APL DERMABOND .7 (GAUZE/BANDAGES/DRESSINGS) ×1
APL PRP STRL LF DISP 70% ISPRP (MISCELLANEOUS) ×1
BLADE SURG 15 STRL LF DISP TIS (BLADE) ×1 IMPLANT
BLADE SURG 15 STRL SS (BLADE) ×2
CHLORAPREP W/TINT 26 (MISCELLANEOUS) ×2 IMPLANT
COVER BACK TABLE 60X90IN (DRAPES) ×2 IMPLANT
COVER MAYO STAND STRL (DRAPES) ×2 IMPLANT
DECANTER SPIKE VIAL GLASS SM (MISCELLANEOUS) ×2 IMPLANT
DERMABOND ADVANCED (GAUZE/BANDAGES/DRESSINGS) ×1
DERMABOND ADVANCED .7 DNX12 (GAUZE/BANDAGES/DRESSINGS) ×1 IMPLANT
DRAPE LAPAROTOMY 100X72 PEDS (DRAPES) ×2 IMPLANT
DRAPE UTILITY XL STRL (DRAPES) ×2 IMPLANT
ELECT REM PT RETURN 9FT ADLT (ELECTROSURGICAL) ×2
ELECTRODE REM PT RTRN 9FT ADLT (ELECTROSURGICAL) ×1 IMPLANT
GLOVE SURG ENC MOIS LTX SZ7 (GLOVE) ×2 IMPLANT
GLOVE SURG POLYISO LF SZ6.5 (GLOVE) ×1 IMPLANT
GLOVE SURG UNDER POLY LF SZ7 (GLOVE) ×1 IMPLANT
GLOVE SURG UNDER POLY LF SZ7.5 (GLOVE) ×2 IMPLANT
GOWN STRL REUS W/ TWL LRG LVL3 (GOWN DISPOSABLE) ×2 IMPLANT
GOWN STRL REUS W/TWL LRG LVL3 (GOWN DISPOSABLE) ×4
NDL HYPO 25X1 1.5 SAFETY (NEEDLE) ×1 IMPLANT
NEEDLE HYPO 25X1 1.5 SAFETY (NEEDLE) ×2 IMPLANT
PACK BASIN DAY SURGERY FS (CUSTOM PROCEDURE TRAY) ×2 IMPLANT
SLEEVE SCD COMPRESS KNEE MED (STOCKING) ×1 IMPLANT
SPONGE T-LAP 4X18 ~~LOC~~+RFID (SPONGE) ×2 IMPLANT
STRIP CLOSURE SKIN 1/2X4 (GAUZE/BANDAGES/DRESSINGS) ×2 IMPLANT
SUT MNCRL AB 4-0 PS2 18 (SUTURE) ×2 IMPLANT
SUT VIC AB 3-0 SH 27 (SUTURE) ×2
SUT VIC AB 3-0 SH 27X BRD (SUTURE) ×1 IMPLANT
SYR CONTROL 10ML LL (SYRINGE) ×2 IMPLANT
TOWEL GREEN STERILE FF (TOWEL DISPOSABLE) ×2 IMPLANT

## 2021-03-17 NOTE — Transfer of Care (Signed)
Immediate Anesthesia Transfer of Care Note  Patient: NAYELI CALVERT  Procedure(s) Performed: REMOVAL PORT-A-CATH (Right: Chest)  Patient Location: PACU  Anesthesia Type:MAC  Level of Consciousness: awake, alert , oriented and patient cooperative  Airway & Oxygen Therapy: Patient Spontanous Breathing and Patient connected to face mask oxygen  Post-op Assessment: Report given to RN and Post -op Vital signs reviewed and stable  Post vital signs: Reviewed and stable  Last Vitals:  Vitals Value Taken Time  BP 100/71 03/17/21 0900  Temp 36.6 C 03/17/21 0852  Pulse 85 03/17/21 0904  Resp 16 03/17/21 0904  SpO2 93 % 03/17/21 0904  Vitals shown include unvalidated device data.  Last Pain:  Vitals:   03/17/21 0852  TempSrc:   PainSc: 0-No pain         Complications: No notable events documented.

## 2021-03-17 NOTE — H&P (Signed)
Anna Stewart is an 65 y.o. female.   Chief Complaint: breast cancer no longer needs venous access HPI: 89 yof s/p lump/sn for tnbc in March, completed chemotherapy and adjuvant radiotherapy. Doing well. No longer needs port and desires removal.   Past Medical History:  Diagnosis Date   Allergy    AR (allergic rhinitis)    Arthritis    Cancer ([place]) 06/2020   right breast [name]   Family history of brain cancer    Family history of lung cancer    Hair loss    Hypertension    x 5years   Migraine    Psoriasis    Seizure ([place])    x 17years   Seizure disorder ([place])    Vitamin D deficiency     Past Surgical History:  Procedure Laterality Date   BREAST BIOPSY Right    benign ? date-before 2006   BREAST BIOPSY Left 05/30/2020   [place] bx, coil marker, path pending   BREAST LUMPECTOMY WITH RADIOACTIVE SEED AND SENTINEL LYMPH NODE BIOPSY Left 06/24/2020   Procedure: LEFT BREAST LUMPECTOMY WITH RADIOACTIVE SEED AND LEFT AXILLARY SENTINEL LYMPH NODE BIOPSY;  Surgeon: Rolm Bookbinder, MD;  Location: [facility];  Service: General;  Laterality: Left;   CESAREAN SECTION     CHOLECYSTECTOMY     KNEE SURGERY     PORTACATH PLACEMENT Right 06/24/2020   Procedure: INSERTION PORT-A-CATH;  Surgeon: Rolm Bookbinder, MD;  Location: [facility];  Service: General;  Laterality: Right;   TONSILLECTOMY     TUBAL LIGATION      Family History  Problem Relation Age of Onset   Brain cancer Mother 29       tumor   Diabetes Father    Heart disease Father    Lung cancer Father    Cancer Paternal Aunt    Diabetes Maternal Grandmother    Stroke Maternal Grandfather    Breast cancer Neg Hx    Social History:  reports that she has never smoked. She has never used smokeless tobacco. She reports current alcohol use. She reports that she does not use drugs.  Allergies:  Allergies  Allergen Reactions   Keppra [Levetiracetam]    Tegretol [Carbamazepine]      Medications Prior to Admission  Medication Sig Dispense Refill   fluticasone (FLONASE) 50 MCG/ACT nasal spray Place 2 sprays into both nostrils daily.     LAMICTAL XR 200 MG TB24 Take 1 tablet (200 mg total) by mouth 2 (two) times daily. Brand Medically Necessary 180 tablet 3   lisinopril-hydrochlorothiazide (ZESTORETIC) 10-12.5 MG tablet Take 1 tablet by mouth daily. Pt states 1/2 tablet a day     pantoprazole (PROTONIX) 20 MG tablet Take 20 mg by mouth daily.     traMADol (ULTRAM) 50 MG tablet Take 1 tablet (50 mg total) by mouth every 6 (six) hours as needed. (Patient not taking: Reported on 03/11/2021) 30 tablet 0   diclofenac Sodium (VOLTAREN) 1 % GEL SMARTSIG:2 Gram(s) Topical 4 Times Daily PRN     lidocaine-prilocaine (EMLA) cream Apply to affected area once 30 g 3   LORazepam (ATIVAN) 0.5 MG tablet Take 1 tablet (0.5 mg total) by mouth every 6 (six) hours as needed (Nausea or vomiting). (Patient not taking: Reported on 10/08/2020) 30 tablet 0   ondansetron (ZOFRAN) 8 MG tablet Take 1 tablet (8 mg total) by mouth 2 (two) times daily as needed. Start on the third day after chemotherapy. (Patient not taking: Reported  on 07/15/2020) 30 tablet 1   pantoprazole (PROTONIX) 40 MG tablet Take 40 mg by mouth daily. (Patient not taking: Reported on 03/11/2021)     prochlorperazine (COMPAZINE) 10 MG tablet TAKE 1 TABLET(10 MG) BY MOUTH EVERY 6 HOURS AS NEEDED FOR NAUSEA OR VOMITING (Patient not taking: Reported on 10/08/2020) 30 tablet 1    No results found for this or any previous visit (from the past 48 hour(s)). No results found.  Review of Systems  All other systems reviewed and are negative.  Height 5\' 7"  (1.702 m), weight 86.2 kg. Physical Exam  Right port in place on chest Cv regular Pulm effort normal General nad  Assessment/Plan Breast cancer no longer needs venous access -port removal  Rolm Bookbinder, MD 03/17/2021, 7:12 AM

## 2021-03-17 NOTE — Discharge Instructions (Addendum)
  Post Anesthesia Home Care Instructions  Next tylenol dose can be given after 1:20pm  Activity: Get plenty of rest for the remainder of the day. A responsible individual must stay with you for 24 hours following the procedure.  For the next 24 hours, DO NOT: -Drive a car -Paediatric nurse -Drink alcoholic beverages -Take any medication unless instructed by your physician -Make any legal decisions or sign important papers.  Meals: Start with liquid foods such as gelatin or soup. Progress to regular foods as tolerated. Avoid greasy, spicy, heavy foods. If nausea and/or vomiting occur, drink only clear liquids until the nausea and/or vomiting subsides. Call your physician if vomiting continues.  Special Instructions/Symptoms: Your throat may feel dry or sore from the anesthesia or the breathing tube placed in your throat during surgery. If this causes discomfort, gargle with warm salt water. The discomfort should disappear within 24 hours.  If you had a scopolamine patch placed behind your ear for the management of post- operative nausea and/or vomiting:  1. The medication in the patch is effective for 72 hours, after which it should be removed.  Wrap patch in a tissue and discard in the trash. Wash hands thoroughly with soap and water. 2. You may remove the patch earlier than 72 hours if you experience unpleasant side effects which may include dry mouth, dizziness or visual disturbances. 3. Avoid touching the patch. Wash your hands with soap and water after contact with the patch.        POST OP INSTRUCTIONS  Always review your discharge instruction sheet given to you by the facility where your surgery was performed.    Take acetaminophen (Tylenol) or ibuprofen (Advil) as needed.  You should follow a light diet for the remainder of the day after your procedure. Most patients will experience some mild swelling and/or bruising in the area of the incision. It may take several days to  resolve. It is common to experience some constipation if taking pain medication after surgery. Increasing fluid intake and taking a stool softener (such as Colace) will usually help or prevent this problem from occurring. A mild laxative (Milk of Magnesia or Miralax) should be taken according to package directions if there are no bowel movements after 48 hours.  If your surgeon used Dermabond (skin glue) on the incision, you may shower in 24 hours.  The glue will flake off over the next 2-3 weeks.  ACTIVITIES:  resume normal activity following day.   WHEN TO CALL YOUR DOCTOR [phone]): Fever over 101.0 Chills Continued bleeding from incision Increased redness and tenderness at the site Shortness of breath, difficulty breathing   The clinic staff is available to answer your questions during regular business hours. Please don't hesitate to call and ask to speak to one of the nurses or medical assistants for clinical concerns. If you have a medical emergency, go to the nearest emergency room or call 911.  A surgeon from [facility] is always on call at the hospital.     For further information, please visit [email or web address]

## 2021-03-17 NOTE — Op Note (Signed)
Preoperative diagnosis: Breast cancer no longer needs venous access Postoperative diagnosis: Same as above Procedure: Port removal Surgeon: Dr. Serita Grammes Anesthesia: General Estimated blood loss: Minimal Complications: None Drains: None Specimens: None Sponge and count was correct completion Disposition recovery stable condition  Indications: This is a 65 year old female who is completed her therapy for breast cancer no longer needs venous access.  She desires port removal.   Procedure: After informed consent was obtained the patient was taken to the operating room.  She had SCDs in place.  she was placed under MAC.    She had then had her right chest prepped and draped in the standard sterile surgical fashion.  A surgical timeout was then performed.  I infiltrated Marcaine at her old scar.  I reentered her old incision.  I removed the port, suture material, and the line in their entirety.  I obtained hemostasis.  I closed this with 3-0 Vicryl and 4-0 Monocryl. Glue and steristrip applied. She tolerated well and was transferred to recovery stable.

## 2021-03-17 NOTE — Anesthesia Procedure Notes (Signed)
Procedure Name: MAC Date/Time: 03/17/2021 8:47 AM Performed by: Signe Colt, CRNA Pre-anesthesia Checklist: Patient identified, Emergency Drugs available, Suction available, Patient being monitored and Timeout performed Patient Re-evaluated:Patient Re-evaluated prior to induction Oxygen Delivery Method: Simple face mask

## 2021-03-17 NOTE — Anesthesia Preprocedure Evaluation (Signed)
Anesthesia Evaluation  Patient identified by MRN, date of birth, ID band Patient awake    Reviewed: Allergy & Precautions, NPO status , Patient's Chart, lab work & pertinent test results  Airway Mallampati: II  TM Distance: >3 FB Neck ROM: Full    Dental no notable dental hx.    Pulmonary neg pulmonary ROS,    Pulmonary exam normal breath sounds clear to auscultation       Cardiovascular hypertension, Pt. on medications Normal cardiovascular exam Rhythm:Regular Rate:Normal     Neuro/Psych  Headaches, Seizures -, Well Controlled,  PSYCHIATRIC DISORDERS Depression    GI/Hepatic Neg liver ROS, GERD  Medicated and Controlled,  Endo/Other  negative endocrine ROS  Renal/GU negative Renal ROS  negative genitourinary   Musculoskeletal  (+) Arthritis , Osteoarthritis,    Abdominal   Peds  Hematology negative hematology ROS (+)   Anesthesia Other Findings L breast ca   Reproductive/Obstetrics negative OB ROS                             Anesthesia Physical  Anesthesia Plan  ASA: II  Anesthesia Plan: MAC   Post-op Pain Management: GA combined w/ Regional for post-op pain   Induction: Intravenous  PONV Risk Score and Plan: 2 and Ondansetron, Midazolam and Treatment may vary due to age or medical condition  Airway Management Planned: Simple Face Mask  Additional Equipment: None  Intra-op Plan:   Post-operative Plan:   Informed Consent: I have reviewed the patients History and Physical, chart, labs and discussed the procedure including the risks, benefits and alternatives for the proposed anesthesia with the patient or authorized representative who has indicated his/her understanding and acceptance.     Dental advisory given  Plan Discussed with: CRNA  Anesthesia Plan Comments:         Anesthesia Quick Evaluation

## 2021-03-17 NOTE — Anesthesia Postprocedure Evaluation (Signed)
Anesthesia Post Note  Patient: Anna Stewart  Procedure(s) Performed: REMOVAL PORT-A-CATH (Right: Chest)     Patient location during evaluation: PACU Anesthesia Type: MAC Level of consciousness: awake and alert Pain management: pain level controlled Vital Signs Assessment: post-procedure vital signs reviewed and stable Respiratory status: spontaneous breathing, nonlabored ventilation and respiratory function stable Cardiovascular status: blood pressure returned to baseline and stable Postop Assessment: no apparent nausea or vomiting Anesthetic complications: no   No notable events documented.  Last Vitals:  Vitals:   03/17/21 0915 03/17/21 0927  BP: 101/74 122/83  Pulse: 86 83  Resp: 16 16  Temp:  (!) 36.4 C  SpO2: 93% 97%    Last Pain:  Vitals:   03/17/21 0927  TempSrc: Oral  PainSc: 0-No pain                 Lynda Rainwater

## 2021-03-20 ENCOUNTER — Encounter (HOSPITAL_BASED_OUTPATIENT_CLINIC_OR_DEPARTMENT_OTHER): Payer: Self-pay | Admitting: General Surgery

## 2021-03-24 ENCOUNTER — Ambulatory Visit
Admission: RE | Admit: 2021-03-24 | Discharge: 2021-03-24 | Disposition: A | Discharge status: Home / Self Care | Payer: 59 | Source: Ambulatory Visit | Attending: Radiation Oncology | Admitting: Radiation Oncology

## 2021-03-24 ENCOUNTER — Other Ambulatory Visit: Payer: Self-pay

## 2021-03-24 ENCOUNTER — Encounter: Payer: Self-pay | Admitting: Radiation Oncology

## 2021-03-24 VITALS — BP 120/80 | HR 87 | Temp 98.2°F | Wt 189.9 lb

## 2021-03-24 DIAGNOSIS — C50512 Malignant neoplasm of lower-outer quadrant of left female breast: Secondary | ICD-10-CM | POA: Insufficient documentation

## 2021-03-24 DIAGNOSIS — Z170 Estrogen receptor positive status [ER+]: Secondary | ICD-10-CM | POA: Diagnosis not present

## 2021-03-24 DIAGNOSIS — Z171 Estrogen receptor negative status [ER-]: Secondary | ICD-10-CM

## 2021-03-24 DIAGNOSIS — Z923 Personal history of irradiation: Secondary | ICD-10-CM | POA: Insufficient documentation

## 2021-03-24 DIAGNOSIS — R5383 Other fatigue: Secondary | ICD-10-CM | POA: Insufficient documentation

## 2021-03-24 DIAGNOSIS — Z9221 Personal history of antineoplastic chemotherapy: Secondary | ICD-10-CM | POA: Diagnosis not present

## 2021-03-24 NOTE — Progress Notes (Signed)
Radiation Oncology Follow up Note  Name: Anna Stewart   Date:   03/24/2021 MRN:  [id number] DOB: [birth year]    This 65 y.o. 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  She had triple negative disease at undergone dose dense chemotherapy of AC followed by 12 cycles of Taxol.  She initially was fatigued and remains quite fatigued.  She specifically denies breast tenderness cough or bone pain.  The moist desquamation is cleared up.  She specifically denies breast tenderness cough or bone pain.  COMPLICATIONS OF TREATMENT: none  FOLLOW UP COMPLIANCE: keeps appointments   PHYSICAL EXAM:  BP 120/80    Pulse 87    Temp 98.2 F (36.8 C) (Tympanic)    Wt 189 lb 14.4 oz (86.1 kg)    BMI 29.74 kg/m  Lungs are clear to A&P cardiac examination essentially unremarkable with regular rate and rhythm. No dominant mass or nodularity is noted in either breast in 2 positions examined. Incision is well-healed. No axillary or supraclavicular adenopathy is appreciated. Cosmetic result is excellent.  Well-developed well-nourished patient in NAD. HEENT reveals PERLA, EOMI, discs not visualized.  Oral cavity is clear. No oral mucosal lesions are identified. Neck is clear without evidence of cervical or supraclavicular adenopathy. Lungs are clear to A&P. Cardiac examination is essentially unremarkable with regular rate and rhythm without murmur rub or thrill. Abdomen is benign with no organomegaly or masses noted. Motor sensory and DTR levels are equal and symmetric in the upper and lower extremities. Cranial nerves II through XII are grossly intact. Proprioception is intact. No peripheral adenopathy or edema is identified. No motor or  sensory levels are noted. Crude visual fields are within normal range.  RADIOLOGY RESULTS: No current films for review  PLAN: At present time patient is from a clinical standpoint doing well.  Fatigue continues to plague her I have suggested she starts exercising which at this point is not doing.  I have explained to her the fatigue is most likely due to her extensive chemotherapy.  I have asked to see her back in 4 to 5 months for follow-up.  I have asked her to contact Dr. Elroy Channel team should her fatigue persist over the next several weeks as she may need some blood work performed.  Otherwise patient knows to call with any concerns.  I would like to take this opportunity to thank you for allowing me to participate in the care of your patient.Noreene Filbert, MD

## 2021-04-28 ENCOUNTER — Other Ambulatory Visit: Payer: Self-pay

## 2021-04-28 ENCOUNTER — Ambulatory Visit: Payer: 59 | Attending: General Surgery

## 2021-04-28 VITALS — Wt 189.1 lb

## 2021-04-28 DIAGNOSIS — Z483 Aftercare following surgery for neoplasm: Secondary | ICD-10-CM | POA: Insufficient documentation

## 2021-04-28 NOTE — Therapy (Signed)
[facility] @ [address] [place], [place], [address] Phone: [phone]   Fax:  [phone]  Physical Therapy Treatment  Patient Details  Name: Anna Stewart MRN: [id number] Date of Birth: Jul 09, 1955 Referring Provider (PT): Donne Hazel   Encounter Date: 04/28/2021   PT End of Session - 04/28/21 1009     Visit Number 2   # unchanged due to screen only   PT Start Time 1006    PT Stop Time 1011    PT Time Calculation (min) 5 min    Activity Tolerance Patient tolerated treatment well    Behavior During Therapy [facility] for tasks assessed/performed             Past Medical History:  Diagnosis Date   Allergy    AR (allergic rhinitis)    Arthritis    Cancer ([place]) 06/2020   right breast [facility]   Family history of brain cancer    Family history of lung cancer    Hair loss    Hypertension    x 5years   Migraine    Psoriasis    Seizure ([place])    x 17years   Seizure disorder ([place])    Vitamin D deficiency     Past Surgical History:  Procedure Laterality Date   BREAST BIOPSY Right    benign ? date-before 2006   BREAST BIOPSY Left 05/30/2020   [place] bx, coil marker, path pending   BREAST LUMPECTOMY WITH RADIOACTIVE SEED AND SENTINEL LYMPH NODE BIOPSY Left 06/24/2020   Procedure: LEFT BREAST LUMPECTOMY WITH RADIOACTIVE SEED AND LEFT AXILLARY SENTINEL LYMPH NODE BIOPSY;  Surgeon: Rolm Bookbinder, MD;  Location: [facility];  Service: General;  Laterality: Left;   CESAREAN SECTION     CHOLECYSTECTOMY     KNEE SURGERY     PORT-A-CATH REMOVAL Right 03/17/2021   Procedure: REMOVAL PORT-A-CATH;  Surgeon: Rolm Bookbinder, MD;  Location: [facility];  Service: General;  Laterality: Right;   PORTACATH PLACEMENT Right 06/24/2020   Procedure: INSERTION PORT-A-CATH;  Surgeon: Rolm Bookbinder, MD;  Location: [facility];  Service: General;  Laterality: Right;   TONSILLECTOMY      TUBAL LIGATION      Vitals:   04/28/21 1008  Weight: 189 lb 2 oz (85.8 kg)     Subjective Assessment - 04/28/21 1009     Subjective Pt returns for her 3 month L-Dex screen.    Pertinent History L breast cancer triple negative, 06/24/20 L lumpectomy and SLNB, then chemo, then radiation,                    L-DEX FLOWSHEETS - 04/28/21 1000       L-DEX LYMPHEDEMA SCREENING   Measurement Type Unilateral    L-DEX MEASUREMENT EXTREMITY Upper Extremity    POSITION  Standing    DOMINANT SIDE Right    At Risk Side Left    BASELINE SCORE (UNILATERAL) 4.4    L-DEX SCORE (UNILATERAL) 4.3    VALUE CHANGE (UNILAT) -0.1                                     PT Long Term Goals - 07/08/20 1537       PT LONG TERM GOAL #1   Title Pt will return to baseline ROM measurements and not demonstrate any signs or symptoms of lymphedema.  Time 6    Period Weeks    Status Achieved                   Plan - 04/28/21 1009     Clinical Impression Statement Pt returns for her 3 month L-Dex screen. Her change from baseline of -0.1 is WNLs so no further treatment is required at this time except to cont every 3 month L-Dex screen which pt is agreeable to.    PT Next Visit Plan Cont every 3 month L-Dex screens for up to 2 years from SLNB (~06/16/22)    Consulted and Agree with Plan of Care Patient             Patient will benefit from skilled therapeutic intervention in order to improve the following deficits and impairments:     Visit Diagnosis: Aftercare following surgery for neoplasm     Problem List Patient Active Problem List   Diagnosis Date Noted   Seizure-like activity ([place]) 12/26/2020   Genetic testing 07/31/2020   Family history of brain cancer    Family history of lung cancer    Malignant neoplasm of lower-inner quadrant of left breast in female, estrogen receptor negative ([place]) 06/19/2020   Osteoarthritis 05/16/2020   High risk  medication use 05/18/2019   Vitamin D deficiency 05/18/2019   Foot pain, bilateral 03/24/2018   Acute foot pain, left 03/16/2016   History of depression 03/16/2016   Vaginal atrophy 05/14/2015   Menopause 05/14/2015   Migraine headache 05/14/2015   Benign essential hypertension 10/12/2014   B12 deficiency 08/30/2013   Depression 08/30/2013   Seizure ([place]) 07/10/2013   Plantar fascial fibromatosis 07/10/2013   Other and unspecified ovarian cyst 07/10/2013   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 @ [address] [place], [place], [address] Phone: [phone]   Fax:  [phone]  Name: Anna Stewart MRN: [id number] Date of Birth: 03/28/1956

## 2021-04-30 ENCOUNTER — Ambulatory Visit
Admission: RE | Admit: 2021-04-30 | Discharge: 2021-04-30 | Disposition: A | Discharge status: Home / Self Care | Payer: 59 | Source: Ambulatory Visit | Attending: Oncology | Admitting: Oncology

## 2021-04-30 ENCOUNTER — Other Ambulatory Visit: Payer: Self-pay

## 2021-04-30 DIAGNOSIS — Z171 Estrogen receptor negative status [ER-]: Secondary | ICD-10-CM

## 2021-04-30 DIAGNOSIS — C50512 Malignant neoplasm of lower-outer quadrant of left female breast: Secondary | ICD-10-CM | POA: Diagnosis present

## 2021-04-30 HISTORY — DX: Personal history of antineoplastic chemotherapy: Z92.21

## 2021-04-30 HISTORY — DX: Personal history of irradiation: Z92.3

## 2021-05-28 ENCOUNTER — Encounter: Payer: Self-pay | Admitting: Oncology

## 2021-07-15 ENCOUNTER — Encounter: Payer: Self-pay | Admitting: Oncology

## 2021-07-15 ENCOUNTER — Inpatient Hospital Stay: Payer: 59 | Attending: Oncology | Admitting: Oncology

## 2021-07-15 VITALS — BP 107/73 | HR 96 | Temp 97.4°F | Resp 16 | Ht 67.0 in | Wt 182.0 lb

## 2021-07-15 DIAGNOSIS — M1990 Unspecified osteoarthritis, unspecified site: Secondary | ICD-10-CM | POA: Diagnosis not present

## 2021-07-15 DIAGNOSIS — Z801 Family history of malignant neoplasm of trachea, bronchus and lung: Secondary | ICD-10-CM | POA: Insufficient documentation

## 2021-07-15 DIAGNOSIS — G629 Polyneuropathy, unspecified: Secondary | ICD-10-CM | POA: Insufficient documentation

## 2021-07-15 DIAGNOSIS — Z171 Estrogen receptor negative status [ER-]: Secondary | ICD-10-CM

## 2021-07-15 DIAGNOSIS — Z808 Family history of malignant neoplasm of other organs or systems: Secondary | ICD-10-CM | POA: Diagnosis not present

## 2021-07-15 DIAGNOSIS — Z08 Encounter for follow-up examination after completed treatment for malignant neoplasm: Secondary | ICD-10-CM | POA: Diagnosis not present

## 2021-07-15 DIAGNOSIS — Z853 Personal history of malignant neoplasm of breast: Secondary | ICD-10-CM | POA: Insufficient documentation

## 2021-07-15 DIAGNOSIS — I10 Essential (primary) hypertension: Secondary | ICD-10-CM | POA: Insufficient documentation

## 2021-07-15 DIAGNOSIS — G8929 Other chronic pain: Secondary | ICD-10-CM | POA: Diagnosis not present

## 2021-07-15 NOTE — Progress Notes (Signed)
Survivorship Care Plan visit completed.  Treatment summary reviewed and given to patient.  ASCO answers booklet reviewed and given to patient.  CARE program and Cancer Transitions discussed with patient along with other resources cancer center offers to patients and caregivers.  Patient verbalized understanding.    

## 2021-07-19 ENCOUNTER — Encounter: Payer: Self-pay | Admitting: Oncology

## 2021-07-19 NOTE — Progress Notes (Signed)
? ? ? ?Hematology/Oncology Consult note ?[facility]  ?[phone] [phone] ? ?Patient Care Team: ?Derinda Late, MD as PCP - General (Family Medicine) ?Theodore Demark, RN as Oncology Nurse Navigator ?Sindy Guadeloupe, MD as Consulting Physician (Oncology) ?Rolm Bookbinder, MD as Consulting Physician (General Surgery) ?Noreene Filbert, MD as Consulting Physician (Radiation Oncology)  ? ?Name of the patient: Anna Stewart  ?[id number]  ?1956/01/07  ? ?Date of visit: 07/19/21 ? 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  ? ?Chief complaint/ Reason for visit-routine follow-up of breast cancer ? ?Heme/Onc history: Patient is a 66 year old female who underwent a routine screening bilateral mammogram on 04/25/2020 which suggested a possible mass in the left breast.  This was followed by diagnostic mammogram and ultrasound which showed a 7 x 7 x 5 mm hypoechoic mass in the 7 o'clock position of the left breast 8 cm from the nipple.  Ultrasound of the left axilla demonstrates 1 normal-appearing left axillary lymph nodes including 1 with borderline diffuse cortical thickening measuring 3.8 mm in maximum thickness.  No  Lymph nodes with focal or eccentric cortical thickening were seen.  The suspicious breast mass was biopsied and was consistent with triple negative grade 3 invasive mammary carcinoma. ?  ?1 cm invasive mammary carcinoma that was grade 2 with negative margins.  1 sentinel lymph node was negative for malignancy.  Patient underwent lumpectomy with sentinel lymph node biopsy.  Final pathology showed 1.1 cm grade 2 invasive mammary carcinoma with negative margins.  1 sentinel lymph node was negative for malignancy.  PT1c  pN 0.   ?  ?Patient completed adjuvant AC Taxol chemotherapy on 12/10/2020.  She also completed adjuvant radiation treatment. ?  ?  ? ?Interval history-patient is doing  well overall and denies any breast concerns at this time.  Appetite and weight have remained stable.  She has chronic joint pain from osteoarthritis but denies other complaints at this time ? ?ECOG PS- 1 ?Pain scale- 2 ? ? ?Review of systems- Review of Systems  ?Constitutional:  Negative for chills, fever, malaise/fatigue and weight loss.  ?HENT:  Negative for congestion, ear discharge and nosebleeds.   ?Eyes:  Negative for blurred vision.  ?Respiratory:  Negative for cough, hemoptysis, sputum production, shortness of breath and wheezing.   ?Cardiovascular:  Negative for chest pain, palpitations, orthopnea and claudication.  ?Gastrointestinal:  Negative for abdominal pain, blood in stool, constipation, diarrhea, heartburn, melena, nausea and vomiting.  ?Genitourinary:  Negative for dysuria, flank pain, frequency, hematuria and urgency.  ?Musculoskeletal:  Negative for back pain, joint pain and myalgias.  ?Skin:  Negative for rash.  ?Neurological:  Negative for dizziness, tingling, focal weakness, seizures, weakness and headaches.  ?Endo/Heme/Allergies:  Does not bruise/bleed easily.  ?Psychiatric/Behavioral:  Negative for depression and suicidal ideas. The patient does not have insomnia.    ? ?Allergies  ?Allergen Reactions  ? Keppra [Levetiracetam]   ? Tegretol [Carbamazepine]   ? ? ? ?Past Medical History:  ?Diagnosis Date  ? Allergy   ? AR (allergic rhinitis)   ? Arthritis   ? Cancer ([place]) 06/2020  ? right breast IMC  ? Family history of brain cancer   ? Family history of lung cancer   ? Hair loss   ? Hypertension   ? x 5years  ? Migraine   ? Personal history of chemotherapy   ? 2022 breast CA  ? Personal history of radiation therapy   ?  2022 breast CA  ? Psoriasis   ? Seizure ([place])   ? x 17years  ? Seizure disorder ([place])   ? Vitamin D deficiency   ? ? ? ?Past Surgical History:  ?Procedure Laterality Date  ? BREAST BIOPSY Right   ? benign ? date-before 2006  ? BREAST BIOPSY Left 05/30/2020  ? [place] bx, coil marker,  path pending  ? BREAST LUMPECTOMY Left   ? 03/22 Invasive ductal CA  Neg margins  ? BREAST LUMPECTOMY WITH RADIOACTIVE SEED AND SENTINEL LYMPH NODE BIOPSY Left 06/24/2020  ? Procedure: LEFT BREAST LUMPECTOMY WITH RADIOACTIVE SEED AND LEFT AXILLARY SENTINEL LYMPH NODE BIOPSY;  Surgeon: Rolm Bookbinder, MD;  Location: [place];  Service: General;  Laterality: Left;  ? CESAREAN SECTION    ? CHOLECYSTECTOMY    ? KNEE SURGERY    ? PORT-A-CATH REMOVAL Right 03/17/2021  ? Procedure: REMOVAL PORT-A-CATH;  Surgeon: Rolm Bookbinder, MD;  Location: [place];  Service: General;  Laterality: Right;  ? PORTACATH PLACEMENT Right 06/24/2020  ? Procedure: INSERTION PORT-A-CATH;  Surgeon: Rolm Bookbinder, MD;  Location: [place];  Service: General;  Laterality: Right;  ? TONSILLECTOMY    ? TUBAL LIGATION    ? ? ?Social History  ? ?Socioeconomic History  ? Marital status: Married  ?  Spouse name: Nicki Reaper  ? Number of children: 3  ? Years of education: College  ? Highest education level: Not on file  ?Occupational History  ? Occupation:    ?  Fish farm manager: OTHER  ?  Comment: [employer]  ?Tobacco Use  ? Smoking status: Never  ? Smokeless tobacco: Never  ?Vaping Use  ? Vaping Use: Never used  ?Substance and Sexual Activity  ? Alcohol use: Yes  ?  Comment: Rarely- wine  ? Drug use: No  ? Sexual activity: Yes  ?  Birth control/protection: Post-menopausal  ?Other Topics Concern  ? Not on file  ?Social History Narrative  ? Patient lives at home with her family.  ? Caffeine Use: 1 cups of coffee daily  ? ?Social Determinants of Health  ? ?Financial Resource Strain: Not on file  ?Food Insecurity: Not on file  ?Transportation Needs: Not on file  ?Physical Activity: Not on file  ?Stress: Not on file  ?Social Connections: Not on file  ?Intimate Partner Violence: Not on file  ? ? ?Family History  ?Problem Relation Age of Onset  ? Brain cancer Mother 68  ?     tumor  ? Diabetes  Father   ? Heart disease Father   ? Lung cancer Father   ? Cancer Paternal Aunt   ? Diabetes Maternal Grandmother   ? Stroke Maternal Grandfather   ? Breast cancer Neg Hx   ? ? ? ?Current Outpatient Medications:  ?  diclofenac Sodium (VOLTAREN) 1 % GEL, SMARTSIG:2 Gram(s) Topical 4 Times Daily PRN, Disp: , Rfl:  ?  fluticasone (FLONASE) 50 MCG/ACT nasal spray, Place 2 sprays into both nostrils daily., Disp: , Rfl:  ?  LAMICTAL XR 200 MG TB24, Take 1 tablet (200 mg total) by mouth 2 (two) times daily. Brand Medically Necessary, Disp: 180 tablet, Rfl: 3 ?  lisinopril-hydrochlorothiazide (ZESTORETIC) 10-12.5 MG tablet, Take 1 tablet by mouth daily. Pt states 1/2 tablet a day, Disp: , Rfl:  ?  pantoprazole (PROTONIX) 40 MG tablet, Take 40 mg by mouth daily., Disp: , Rfl:  ?  traMADol (ULTRAM) 50 MG tablet, Take 1 tablet (50 mg total)  by mouth every 6 (six) hours as needed., Disp: 30 tablet, Rfl: 0 ? ?Physical exam:  ?Vitals:  ? 07/15/21 1439  ?BP: 107/73  ?Pulse: 96  ?Resp: 16  ?Temp: (!) 97.4 ?F (36.3 ?C)  ?TempSrc: Oral  ?Weight: 182 lb (82.6 kg)  ?Height: '5\' 7"'  (1.702 m)  ? ?Physical Exam ?Constitutional:   ?   General: She is not in acute distress. ?Eyes:  ?   Pupils: Pupils are equal, round, and reactive to light.  ?Cardiovascular:  ?   Rate and Rhythm: Normal rate and regular rhythm.  ?   Heart sounds: Normal heart sounds.  ?Pulmonary:  ?   Effort: Pulmonary effort is normal.  ?   Breath sounds: Normal breath sounds.  ?Abdominal:  ?   General: Bowel sounds are normal.  ?   Palpations: Abdomen is soft.  ?Skin: ?   General: Skin is warm and dry.  ?Neurological:  ?   Mental Status: She is alert and oriented to person, place, and time.  ?  ?Breast exam was performed in seated and lying down position. ?Patient is status post left lumpectomy with a well-healed surgical scar. No evidence of any palpable masses. No evidence of axillary adenopathy. No evidence of any palpable masses or lumps in the right breast. No  evidence of right axillary adenopathy ? ? ?  Latest Ref Rng & Units 03/11/2021  ? 10:40 AM  ?CMP  ?Glucose 70 - 99 mg/dL 110    ?BUN 8 - 23 mg/dL 15    ?Creatinine 0.44 - 1.00 mg/dL 0.71    ?Sodium 135 - 145 mmo

## 2021-07-26 ENCOUNTER — Other Ambulatory Visit: Payer: Self-pay | Admitting: Oncology

## 2021-07-26 DIAGNOSIS — C50512 Malignant neoplasm of lower-outer quadrant of left female breast: Secondary | ICD-10-CM

## 2021-07-28 ENCOUNTER — Ambulatory Visit: Payer: 59

## 2021-07-28 ENCOUNTER — Encounter: Payer: Self-pay | Admitting: Oncology

## 2021-08-11 ENCOUNTER — Encounter: Payer: 59 | Attending: Family Medicine

## 2021-08-11 DIAGNOSIS — C50512 Malignant neoplasm of lower-outer quadrant of left female breast: Secondary | ICD-10-CM

## 2021-08-11 NOTE — Progress Notes (Signed)
Spoke with patient regarding interview questions for the CARE program. Medical history and medications reviewed and all questions answered. Patient is due in tomorrow, 5/9 for 6WMT and evaluation. ?

## 2021-08-12 VITALS — Ht 68.5 in | Wt 182.3 lb

## 2021-08-12 DIAGNOSIS — Z171 Estrogen receptor negative status [ER-]: Secondary | ICD-10-CM

## 2021-08-12 NOTE — Progress Notes (Signed)
CARE Daily Session Note ? ?Patient Details  ?Name: Anna Stewart ?MRN: [id number] ?Date of Birth: 07-Aug-1955 ?Referring Provider:   ? ?Encounter Date: 08/12/2021 ? ?Check In: ? Session Check In - 08/12/21 1503   ? ?  ? Check-In  ? Supervising physician immediately available to respond to emergencies See telemetry face sheet for immediately available ER MD   ? Location [facility]-Cardiac & Pulmonary Rehab   ? Staff Present Coralie Keens, MS, ASCM CEP, Exercise Physiologist;Jessica Luan Pulling, MA, RCEP, CCRP, CCET   ? Virtual Visit No   ? Medication changes reported     No   ? Fall or balance concerns reported    No   ? Warm-up and Cool-down Not performed (comment)   6MWT and Gym Orientation  ? Resistance Training Performed No   ? VAD Patient? No   ? PAD/SET Patient? No   ? ?  ?  ? ?  ? ? ? ? ? 6 Minute Walk   ? ? [place] Name 08/12/21 1513  ?  ?  ?  ? 6 Minute Walk  ? Phase Initial    ? Distance 1360 feet    ? Walk Time 6 minutes    ? # of Rest Breaks 0    ? MPH 2.57    ? METS 3.65    ? RPE 10    ? Perceived Dyspnea  1    ? VO2 Peak 12.78    ? Symptoms No    ? Resting HR 103 bpm    ? Resting BP 106/62    ? Resting Oxygen Saturation  95 %    ? Exercise Oxygen Saturation  during 6 min walk 94 %    ? Max Ex. HR 123 bpm    ? Max Ex. BP 138/64    ? 2 Minute Post BP 112/66    ? ?  ?  ? ?  ?  ? Exercise Prescription Changes - 08/12/21 1500   ? ?  ? Response to Exercise  ? Blood Pressure (Admit) 106/62   ? Blood Pressure (Exercise) 138/64   ? Blood Pressure (Exit) 112/66   ? Heart Rate (Admit) 103 bpm   ? Heart Rate (Exercise) 123 bpm   ? Heart Rate (Exit) 107 bpm   ? Oxygen Saturation (Admit) 95 %   ? Oxygen Saturation (Exercise) 94 %   ? Oxygen Saturation (Exit) 96 %   ? Rating of Perceived Exertion (Exercise) 10   ? Perceived Dyspnea (Exercise) 1   ? Symptoms none   ? Comments walk test results   ? ?  ?  ? ?  ? ? ?Social History  ? ?Tobacco Use  ?Smoking Status Never  ?Smokeless Tobacco Never  ? ? ?Goals Met:  ?Proper associated  with RPD/PD & O2 Sat ?Exercise tolerated well ?Personal goals reviewed ?No report of concerns or symptoms today ? ?Goals Unmet:  ?Not Applicable ? ?Comments: First full day of orientation. All starting workloads were established based on the results of the 6 minute walk test done at initial orientation visit.  The plan for exercise progression was also introduced and progression will be customized based on patient's performance and goals.  ? ? ?Dr. Emily Filbert is Medical Director for [place].  ?Dr. Ottie Glazier is Medical Director for [facility] Pulmonary Rehabilitation. ?

## 2021-08-19 DIAGNOSIS — Z171 Estrogen receptor negative status [ER-]: Secondary | ICD-10-CM

## 2021-08-19 NOTE — Progress Notes (Signed)
Daily Session Note ? ?Patient Details  ?Name: Anna Stewart ?MRN: [id number] ?Date of Birth: 16-Aug-1955 ?Referring Provider:   ?Flowsheet Row Cancer Associated Rehabilitation & Exercise from 08/12/2021 in [facility] Cardiac and Pulmonary Rehab  ?Referring Provider [name] MD  ? ?  ? ? ?Encounter Date: 08/19/2021 ? ?Check In: ? Session Check In - 08/19/21 1546   ? ?  ? Check-In  ? Supervising physician immediately available to respond to emergencies See telemetry face sheet for immediately available ER MD   ? Location [facility]   ? Staff Present Coralie Keens, MS, ASCM CEP, Exercise Physiologist;Jessica Mallow, MA, RCEP, CCRP, CCET;Kelly Bollinger, MPA, RN;Melissa [place], RDN, LDN   ? Virtual Visit No   ? Medication changes reported     No   ? Fall or balance concerns reported    No   ? Warm-up and Cool-down Performed on first and last piece of equipment   ? Resistance Training Performed Yes   ? VAD Patient? No   ? PAD/SET Patient? No   ? ?  ?  ? ?  ? ? ? ? ? ?Social History  ? ?Tobacco Use  ?Smoking Status Never  ?Smokeless Tobacco Never  ? ? ?Goals Met:  ?Proper associated with RPD/PD & O2 Sat ?Independence with exercise equipment ?Exercise tolerated well ?No report of concerns or symptoms today ?Strength training completed today ? ?Goals Unmet:  ?Not Applicable ? ?Comments: First full day of exercise!  Patient was oriented to gym and equipment including functions, settings, policies, and procedures.  Patient's individual exercise prescription and treatment plan were reviewed.  All starting workloads were established based on the results of the 6 minute walk test done at initial orientation visit.  The plan for exercise progression was also introduced and progression will be customized based on patient's performance and goals.  ? ? ?Dr. Emily Filbert is Medical Director for [place].  ?Dr. Ottie Glazier is Medical Director for [facility] Pulmonary Rehabilitation. ?

## 2021-08-20 ENCOUNTER — Encounter (HOSPITAL_COMMUNITY): Payer: Self-pay

## 2021-08-21 DIAGNOSIS — Z171 Estrogen receptor negative status [ER-]: Secondary | ICD-10-CM

## 2021-08-21 NOTE — Progress Notes (Signed)
Daily Session Note  Patient Details  Name: Anna Stewart MRN: [id number] Date of Birth: 05/23/1955 Referring Provider:   April Manson Cancer Associated Rehabilitation & Exercise from 08/12/2021 in [facility] Cardiac and Pulmonary Rehab  Referring Provider [name] MD       Encounter Date: 08/21/2021  Check In:  Session Check In - 08/21/21 1220       Check-In   Supervising physician immediately available to respond to emergencies See telemetry face sheet for immediately available ER MD    Location [facility]Card[facility] & Pulmonary Rehab    Staff Present Alberteen Sam, MA, RCEP, CCRP, Marylynn Pearson, MS, ASCM CEP, Exercise Physiologist;Jens Siems Desma Maxim, RN, BSN    Virtual Visit No    Medication changes reported     No    Fall or balance concerns reported    No    Warm-up and Cool-down Performed on first and last piece of equipment    Resistance Training Performed Yes    VAD Patient? No    PAD/SET Patient? No      Pain Assessment   Currently in Pain? No/denies                Social History   Tobacco Use  Smoking Status Never  Smokeless Tobacco Never    Goals Met:  Proper associated with RPD/PD & O2 Sat Independence with exercise equipment Using PLB without cueing & demonstrates good technique Exercise tolerated well No report of concerns or symptoms today Strength training completed today  Goals Unmet:  Not Applicable  Comments: Pt able to follow exercise prescription today without complaint.  Will continue to monitor for progression.    Dr. Emily Filbert is Medical Director for [place].  Dr. Ottie Glazier is Medical Director for [facility] Pulmonary Rehabilitation.

## 2021-08-25 ENCOUNTER — Ambulatory Visit: Payer: 59 | Admitting: Radiation Oncology

## 2021-08-26 DIAGNOSIS — Z171 Estrogen receptor negative status [ER-]: Secondary | ICD-10-CM

## 2021-08-26 NOTE — Progress Notes (Signed)
Daily Session Note  Patient Details  Name: Anna Stewart MRN: [id number] Date of Birth: 10/12/1955 Referring Provider:   April Manson Cancer Associated Rehabilitation & Exercise from 08/12/2021 in [facility] Cardiac and Pulmonary Rehab  Referring Provider [name] MD       Encounter Date: 08/26/2021  Check In:  Session Check In - 08/26/21 1212       Check-In   Supervising physician immediately available to respond to emergencies See telemetry face sheet for immediately available ER MD    Location [facility] Pulmonary Re[facility]    Staff Present Birdie Sons, MPA, RN;Jessica Luan Pulling, MA, RCEP, CCRP, Marylynn Pearson, MS, ASCM CEP, Exercise Physiologist    Virtual Visit No    Medication changes reported     No    Fall or balance concerns reported    No    Warm-up and Cool-down Performed on first and last piece of equipment    Resistance Training Performed Yes    VAD Patient? No    PAD/SET Patient? No      Pain Assessment   Currently in Pain? No/denies                Social History   Tobacco Use  Smoking Status Never  Smokeless Tobacco Never    Goals Met:  Independence with exercise equipment Exercise tolerated well No report of concerns or symptoms today Strength training completed today  Goals Unmet:  Not Applicable  Comments: Pt able to follow exercise prescription today without complaint.  Will continue to monitor for progression.    Dr. Emily Filbert is Medical Director for [place].  Dr. Ottie Glazier is Medical Director for [facility] Pulmonary Rehabilitation.

## 2021-08-28 DIAGNOSIS — C50512 Malignant neoplasm of lower-outer quadrant of left female breast: Secondary | ICD-10-CM

## 2021-08-28 NOTE — Progress Notes (Signed)
Daily Session Note  Patient Details  Name: Niajah E Carreon MRN: [id number] Date of Birth: 02/23/1956 Referring Provider:   Flowsheet Row Cancer Associated Rehabilitation & Exercise from 08/12/2021 in [facility] Cardiac and Pulmonary Rehab  Referring Provider Rao, Archana MD       Encounter Date: 08/28/2021  Check In:  Session Check In - 08/28/21 1307       Check-In   Supervising physician immediately available to respond to emergencies See telemetry face sheet for immediately available ER MD    Location [facility] Pulmonary Rehab    Staff Present Kara Langdon, MS, ASCM CEP, Exercise Physiologist;Laureen Brown, BS, RRT, CPFT    Virtual Visit No    Medication changes reported     No    Fall or balance concerns reported    No    Warm-up and Cool-down Performed on first and last piece of equipment    Resistance Training Performed Yes    VAD Patient? No    PAD/SET Patient? No                Social History   Tobacco Use  Smoking Status Never  Smokeless Tobacco Never    Goals Met:  Proper associated with RPD/PD & O2 Sat Independence with exercise equipment Exercise tolerated well No report of concerns or symptoms today Strength training completed today  Goals Unmet:  Not Applicable  Comments: Pt able to follow exercise prescription today without complaint.  Will continue to monitor for progression.    Dr. Mark Miller is Medical Director for [facility] Cardiac Rehabilitation.  Dr. Fuad Aleskerov is Medical Director for [facility] Pulmonary Rehabilitation. 

## 2021-08-29 ENCOUNTER — Encounter: Payer: Self-pay | Admitting: Radiation Oncology

## 2021-08-29 ENCOUNTER — Ambulatory Visit
Admission: RE | Admit: 2021-08-29 | Discharge: 2021-08-29 | Disposition: A | Discharge status: Home / Self Care | Payer: 59 | Source: Ambulatory Visit | Attending: Radiation Oncology | Admitting: Radiation Oncology

## 2021-08-29 VITALS — BP 127/78 | HR 93 | Temp 96.8°F | Ht 68.5 in | Wt 182.0 lb

## 2021-08-29 DIAGNOSIS — Z853 Personal history of malignant neoplasm of breast: Secondary | ICD-10-CM | POA: Diagnosis present

## 2021-08-29 DIAGNOSIS — Z923 Personal history of irradiation: Secondary | ICD-10-CM | POA: Insufficient documentation

## 2021-08-29 DIAGNOSIS — Z171 Estrogen receptor negative status [ER-]: Secondary | ICD-10-CM

## 2021-08-29 NOTE — Progress Notes (Signed)
Radiation Oncology Follow up Note  Name: Anna Stewart   Date:   08/29/2021 MRN:  [id number] DOB: [birth year]    This 66 y.o. female presents to the clinic today for 46-monthfollow-up status post whole breast radiation to her left breast for stage Ib invasive mammary carcinoma triple negative.  REFERRING PROVIDER: BDerinda Late MD  HPI: Patient is a 66year old female now out 6 months having completed whole breast radiation to her left breast for stage Ib invasive mammary carcinoma triple negative.  Seen today in routine follow-up she is doing well.  She specifically denies breast tenderness cough or bone pain..  She had mammograms back in January which I have reviewed were BI-RADS 2 benign.  She is not on antiestrogen therapy based on the triple negative nature of her disease.  COMPLICATIONS OF TREATMENT: none  FOLLOW UP COMPLIANCE: keeps appointments   PHYSICAL EXAM:  BP 127/78   Pulse 93   Temp (!) 96.8 F (36 C)   Ht 5' 8.5" (1.74 m)   Wt 182 lb (82.6 kg)   BMI 27.27 kg/m  Lungs are clear to A&P cardiac examination essentially unremarkable with regular rate and rhythm. No dominant mass or nodularity is noted in either breast in 2 positions examined. Incision is well-healed. No axillary or supraclavicular adenopathy is appreciated. Cosmetic result is excellent.  Well-developed well-nourished patient in NAD. HEENT reveals PERLA, EOMI, discs not visualized.  Oral cavity is clear. No oral mucosal lesions are identified. Neck is clear without evidence of cervical or supraclavicular adenopathy. Lungs are clear to A&P. Cardiac examination is essentially unremarkable with regular rate and rhythm without murmur rub or thrill. Abdomen is benign with no organomegaly or masses noted. Motor sensory and DTR levels are equal and symmetric in the upper and lower extremities. Cranial nerves II through XII are grossly intact. Proprioception is intact. No peripheral adenopathy or edema is  identified. No motor or sensory levels are noted. Crude visual fields are within normal range.  RADIOLOGY RESULTS: Mammograms reviewed compatible with above-stated findings  PLAN: Present time patient is doing well with no evidence of disease 6 months out.  She has an excellent cosmetic result.  Pleased with her overall progress.  I have asked to see her back in 6 months and I will start once year follow-up visits.  Patient knows to call with any concerns.  I would like to take this opportunity to thank you for allowing me to participate in the care of your patient..Noreene Filbert MD

## 2021-09-02 DIAGNOSIS — C50512 Malignant neoplasm of lower-outer quadrant of left female breast: Secondary | ICD-10-CM

## 2021-09-02 NOTE — Progress Notes (Signed)
Daily Session Note  Patient Details  Name: Anna Stewart MRN: [id number] Date of Birth: March 30, 1956 Referring Provider:   April Manson Cancer Associated Rehabilitation & Exercise from 08/12/2021 in [facility] Cardiac and Pulmonary Rehab  Referring Provider [name] MD       Encounter Date: 09/02/2021  Check In:  Session Check In - 09/02/21 1209       Check-In   Supervising physician immediately available to respond to emergencies See telemetry face sheet for immediately available ER MD    Location [facility]Cardiac & Pulmonary Rehab    Staff Present Birdie Sons, MPA, Nino Glow, MS, ASCM CEP, Exercise Physiologist;Jessica Luan Pulling, MA, RCEP, CCRP, CCET    Virtual Visit No    Medication changes reported     No    Fall or balance concerns reported    No    Warm-up and Cool-down Performed on first and last piece of equipment    Resistance Training Performed Yes    VAD Patient? No    PAD/SET Patient? No      Pain Assessment   Currently in Pain? No/denies                Social History   Tobacco Use  Smoking Status Never  Smokeless Tobacco Never    Goals Met:  Independence with exercise equipment Exercise tolerated well No report of concerns or symptoms today Strength training completed today  Goals Unmet:  Not Applicable  Comments: Pt able to follow exercise prescription today without complaint.  Will continue to monitor for progression.    Dr. Emily Filbert is Medical Director for [place].  Dr. Ottie Glazier is Medical Director for [facility] Pulmonary Rehabilitation.

## 2021-09-04 ENCOUNTER — Encounter: Payer: 59 | Attending: Family Medicine

## 2021-09-04 DIAGNOSIS — C50512 Malignant neoplasm of lower-outer quadrant of left female breast: Secondary | ICD-10-CM | POA: Insufficient documentation

## 2021-09-04 DIAGNOSIS — Z171 Estrogen receptor negative status [ER-]: Secondary | ICD-10-CM | POA: Insufficient documentation

## 2021-09-04 NOTE — Progress Notes (Signed)
Daily Session Note  Patient Details  Name: Anna Stewart MRN: [id number] Date of Birth: 03/07/1956 Referring Provider:   April Manson Cancer Associated Rehabilitation & Exercise from 08/12/2021 in [facility] Cardiac and Pulmonary Rehab  Referring Provider [name] MD       Encounter Date: 09/04/2021  Check In:  Session Check In - 09/04/21 1223       Check-In   Supervising physician immediately available to respond to emergencies See telemetry face sheet for immediately available ER MD    Location [facility]-Cardiac & Pulmonary Rehab    Staff Present Birdie Sons, MPA, RN;Jessica [place], MA, RCEP, CCRP, Marylynn Pearson, MS, ASCM CEP, Exercise Physiologist;Noah Tickle, BS, Exercise Physiologist    Virtual Visit No    Medication changes reported     No    Fall or balance concerns reported    No    Warm-up and Cool-down Performed on first and last piece of equipment    Resistance Training Performed Yes    VAD Patient? No    PAD/SET Patient? No      Pain Assessment   Currently in Pain? No/denies                Social History   Tobacco Use  Smoking Status Never  Smokeless Tobacco Never    Goals Met:  Independence with exercise equipment Exercise tolerated well No report of concerns or symptoms today Strength training completed today  Goals Unmet:  Not Applicable  Comments: Pt able to follow exercise prescription today without complaint.  Will continue to monitor for progression.    Dr. Emily Filbert is Medical Director for [employer].  Dr. Ottie Glazier is Medical Director for [facility] Pulmonary Rehabilitation.

## 2021-09-11 DIAGNOSIS — C50512 Malignant neoplasm of lower-outer quadrant of left female breast: Secondary | ICD-10-CM

## 2021-09-11 NOTE — Progress Notes (Signed)
Daily Session Note  Patient Details  Name: Anna Stewart MRN: [id number] Date of Birth: 07-26-1955 Referring Provider:   April Stewart Cancer Associated Rehabilitation & Exercise from 08/12/2021 in [facility] Cardiac and Pulmonary Rehab  Referring Provider Anna Evens MD       Encounter Date: 09/11/2021  Check In:  Session Check In - 09/11/21 1212       Check-In   Supervising physician immediately available to respond to emergencies See telemetry face sheet for immediately available ER MD    Location [facility]-Cardiac & Pulmonary Rehab    Staff Present Anna Keens, MS, ASCM CEP, Exercise Physiologist;Anna Luan Pulling, MA, RCEP, CCRP, Anna Stewart, MPA, RN    Virtual Visit No    Medication changes reported     No    Fall or balance concerns reported    No    Warm-up and Cool-down Performed on first and last piece of equipment    Resistance Training Performed Yes    VAD Patient? No    PAD/SET Patient? No      Pain Assessment   Currently in Pain? No/denies                Social History   Tobacco Use  Smoking Status Never  Smokeless Tobacco Never    Goals Met:  Independence with exercise equipment Exercise tolerated well No report of concerns or symptoms today Strength training completed today  Goals Unmet:  Not Applicable  Comments: Pt able to follow exercise prescription today without complaint.  Will continue to monitor for progression.    Dr. Emily Stewart is Medical Director for Anna Stewart.  Dr. Ottie Stewart is Medical Director for [facility] Pulmonary Rehabilitation.

## 2021-09-16 ENCOUNTER — Encounter: Payer: 59 | Admitting: *Deleted

## 2021-09-16 DIAGNOSIS — C50512 Malignant neoplasm of lower-outer quadrant of left female breast: Secondary | ICD-10-CM

## 2021-09-16 NOTE — Progress Notes (Signed)
Daily Session Note  Patient Details  Name: Anna Stewart MRN: [id number] Date of Birth: September 30, 1955 Referring Provider:   April Manson Cancer Associated Rehabilitation & Exercise from 08/12/2021 in [facility] Cardiac and Pulmonary Rehab  Referring Provider [name] MD       Encounter Date: 09/16/2021  Check In:  Session Check In - 09/16/21 1220       Check-In   Supervising physician immediately available to respond to emergencies See telemetry face sheet for immediately available ER MD    Location [facility]-Cardiac & Pulmonary Rehab    Staff Present Heath Lark, RN, BSN, CCRP;Avyay Coger [place], MA, RCEP, CCRP, Marylynn Pearson, MS, ASCM CEP, Exercise Physiologist    Virtual Visit No    Medication changes reported     No    Fall or balance concerns reported    No    Warm-up and Cool-down Performed on first and last piece of equipment    Resistance Training Performed Yes    VAD Patient? No    PAD/SET Patient? No      Pain Assessment   Currently in Pain? No/denies                Social History   Tobacco Use  Smoking Status Never  Smokeless Tobacco Never    Goals Met:  Proper associated with RPD/PD & O2 Sat Independence with exercise equipment Exercise tolerated well No report of concerns or symptoms today Strength training completed today  Goals Unmet:  Not Applicable  Comments: Pt able to follow exercise prescription today without complaint.  Will continue to monitor for progression.    Dr. Emily Filbert is Medical Director for [place].  Dr. Ottie Glazier is Medical Director for [facility] Pulmonary Rehabilitation.

## 2021-09-18 DIAGNOSIS — Z171 Estrogen receptor negative status [ER-]: Secondary | ICD-10-CM

## 2021-09-18 NOTE — Progress Notes (Signed)
Daily Session Note  Patient Details  Name: Anna Stewart MRN: [id number] Date of Birth: Jun 29, 1955 Referring Provider:   April Manson Cancer Associated Rehabilitation & Exercise from 08/12/2021 in [facility] Cardiac and Pulmonary Rehab  Referring Provider [name] MD       Encounter Date: 09/18/2021  Check In:  Session Check In - 09/18/21 1246       Check-In   Supervising physician immediately available to respond to emergencies See telemetry face sheet for immediately available ER MD    Location [facility]-Cardiac & Pulmonary Rehab    Staff Present Coralie Keens, MS, ASCM CEP, Exercise Physiologist;Melissa Caiola, RDN, LDN    Medication changes reported     No    Fall or balance concerns reported    No    Warm-up and Cool-down Performed on first and last piece of equipment    Resistance Training Performed Yes    VAD Patient? No    PAD/SET Patient? No                Social History   Tobacco Use  Smoking Status Never  Smokeless Tobacco Never    Goals Met:  Independence with exercise equipment Exercise tolerated well No report of concerns or symptoms today Strength training completed today  Goals Unmet:  Not Applicable  Comments: Pt able to follow exercise prescription today without complaint.  Will continue to monitor for progression.    Dr. Emily Filbert is Medical Director for [place].  Dr. Ottie Glazier is Medical Director for [facility] Pulmonary Rehabilitation.

## 2021-09-23 DIAGNOSIS — Z171 Estrogen receptor negative status [ER-]: Secondary | ICD-10-CM

## 2021-09-23 NOTE — Progress Notes (Signed)
Daily Session Note  Patient Details  Name: Anna Stewart MRN: [id number] Date of Birth: [birth year] Referring Provider:   April Manson Cancer Associated Rehabilitation & Exercise from 08/12/2021 in [facility] Cardiac and Pulmonary Rehab  Referring Provider [name] MD       Encounter Date: 09/23/2021  Check In:  Session Check In - 09/23/21 1213       Check-In   Supervising physician immediately available to respond to emergencies See telemetry face sheet for immediately available ER MD    Location [facility]-Cardiac & Pulmonary Rehab    Staff Present Birdie Sons, MPA, RN;Melissa [place], RDN, Tawanna Solo, MS, ASCM CEP, Exercise Physiologist    Virtual Visit No    Medication changes reported     No    Fall or balance concerns reported    No    Warm-up and Cool-down Performed on first and last piece of equipment    Resistance Training Performed Yes    VAD Patient? No    PAD/SET Patient? No      Pain Assessment   Currently in Pain? No/denies                Social History   Tobacco Use  Smoking Status Never  Smokeless Tobacco Never    Goals Met:  Independence with exercise equipment Exercise tolerated well No report of concerns or symptoms today Strength training completed today  Goals Unmet:  Not Applicable  Comments: Pt able to follow exercise prescription today without complaint.  Will continue to monitor for progression.    Dr. Emily Filbert is Medical Director for [place].  Dr. Ottie Glazier is Medical Director for [facility] Pulmonary Rehabilitation.

## 2021-09-25 DIAGNOSIS — C50512 Malignant neoplasm of lower-outer quadrant of left female breast: Secondary | ICD-10-CM

## 2021-09-25 NOTE — Progress Notes (Signed)
Daily Session Note  Patient Details  Name: Anna Stewart MRN: [id number] Date of Birth: [birth year] Referring Provider:   April Manson Cancer Associated Rehabilitation & Exercise from 08/12/2021 in [facility] Cardiac and Pulmonary Rehab  Referring Provider [name] MD       Encounter Date: 09/25/2021  Check In:  Session Check In - 09/25/21 1217       Check-In   Supervising physician immediately available to respond to emergencies See telemetry face sheet for immediately available ER MD    Location [facility]-Cardiac & Pulmonary Rehab    Staff Present Carson Myrtle, BS, RRT, CPFT;Mollee Neer, MPA, RN;Susanne Bice, RN, BSN, CCRP    Virtual Visit No    Medication changes reported     No    Fall or balance concerns reported    No    Warm-up and Cool-down Performed on first and last piece of equipment    Resistance Training Performed Yes    VAD Patient? No    PAD/SET Patient? No      Pain Assessment   Currently in Pain? No/denies                Social History   Tobacco Use  Smoking Status Never  Smokeless Tobacco Never    Goals Met:  Independence with exercise equipment Exercise tolerated well No report of concerns or symptoms today Strength training completed today  Goals Unmet:  Not Applicable  Comments: Pt able to follow exercise prescription today without complaint.  Will continue to monitor for progression.    Dr. Emily Filbert is Medical Director for [place].  Dr. Ottie Glazier is Medical Director for [facility] Pulmonary Rehabilitation.

## 2021-09-30 DIAGNOSIS — Z171 Estrogen receptor negative status [ER-]: Secondary | ICD-10-CM

## 2021-10-02 DIAGNOSIS — C50512 Malignant neoplasm of lower-outer quadrant of left female breast: Secondary | ICD-10-CM

## 2021-10-02 NOTE — Progress Notes (Signed)
Daily Session Note  Patient Details  Name: Anna Stewart MRN: [id number] Date of Birth: 09/16/1955 Referring Provider:   Flowsheet Row Cancer Associated Rehabilitation & Exercise from 08/12/2021 in [facility] Cardiac and Pulmonary Rehab  Referring Provider Rao, Archana MD       Encounter Date: 10/02/2021  Check In:  Session Check In - 10/02/21 1220       Check-In   Supervising physician immediately available to respond to emergencies See telemetry face sheet for immediately available ER MD    Location [facility]-Cardiac & Pulmonary Rehab    Staff Present Noah Tickle, BS, Exercise Physiologist;Kara Langdon, MS, ASCM CEP, Exercise Physiologist;Jessica Hawkins, MA, RCEP, CCRP, CCET    Virtual Visit No    Medication changes reported     No    Fall or balance concerns reported    No    Warm-up and Cool-down Performed as group-led instruction    Resistance Training Performed Yes    VAD Patient? No    PAD/SET Patient? No      Pain Assessment   Currently in Pain? No/denies                Social History   Tobacco Use  Smoking Status Never  Smokeless Tobacco Never    Goals Met:  Proper associated with RPD/PD & O2 Sat Independence with exercise equipment Exercise tolerated well No report of concerns or symptoms today Strength training completed today  Goals Unmet:  Not Applicable  Comments: Pt able to follow exercise prescription today without complaint.  Will continue to monitor for progression.  Yoga completed today.    Dr. Mark Miller is Medical Director for [facility] Cardiac Rehabilitation.  Dr. Fuad Aleskerov is Medical Director for [facility] Pulmonary Rehabilitation. 

## 2021-10-20 ENCOUNTER — Encounter: Payer: Self-pay | Admitting: Oncology

## 2021-10-20 ENCOUNTER — Other Ambulatory Visit: Payer: Self-pay | Admitting: *Deleted

## 2021-10-20 DIAGNOSIS — C50312 Malignant neoplasm of lower-inner quadrant of left female breast: Secondary | ICD-10-CM

## 2021-10-21 ENCOUNTER — Encounter: Payer: 59 | Attending: Family Medicine

## 2021-10-21 DIAGNOSIS — C50512 Malignant neoplasm of lower-outer quadrant of left female breast: Secondary | ICD-10-CM | POA: Insufficient documentation

## 2021-10-21 DIAGNOSIS — Z171 Estrogen receptor negative status [ER-]: Secondary | ICD-10-CM | POA: Insufficient documentation

## 2021-10-21 NOTE — Progress Notes (Signed)
Daily Session Note  Patient Details  Name: Anna Stewart MRN: [id number] Date of Birth: [birth year] Referring Provider:   April Manson Cancer Associated Rehabilitation & Exercise from 08/12/2021 in [facility] Cardiac and Pulmonary Rehab  Referring Provider [name] MD       Encounter Date: 10/21/2021  Check In:  Session Check In - 10/21/21 1236       Check-In   Supervising physician immediately available to respond to emergencies See telemetry face sheet for immediately available ER MD    Location [facility]-Cardiac & Pulmonary Rehab    Staff Present Antionette Fairy, BS, Exercise Physiologist;Kara [place], MS, ASCM CEP, Exercise Physiologist;Jessica Bonsall, MA, RCEP, CCRP, CCET;Susanne Bice, RN, BSN, CCRP    Virtual Visit No    Medication changes reported     No    Fall or balance concerns reported    No    Tobacco Cessation No Change    Warm-up and Cool-down Performed as group-led instruction    Resistance Training Performed Yes    VAD Patient? No    PAD/SET Patient? No      Pain Assessment   Currently in Pain? No/denies    Multiple Pain Sites No                Social History   Tobacco Use  Smoking Status Never  Smokeless Tobacco Never    Goals Met:  Independence with exercise equipment Exercise tolerated well No report of concerns or symptoms today Strength training completed today  Goals Unmet:  Not Applicable  Comments: Pt able to follow exercise prescription today without complaint.  Will continue to monitor for progression.    Dr. Emily Filbert is Medical Director for [place].  Dr. Ottie Glazier is Medical Director for [facility] Pulmonary Rehabilitation.

## 2021-10-23 ENCOUNTER — Encounter: Payer: 59 | Admitting: *Deleted

## 2021-10-23 DIAGNOSIS — C50512 Malignant neoplasm of lower-outer quadrant of left female breast: Secondary | ICD-10-CM

## 2021-10-23 NOTE — Progress Notes (Signed)
Daily Session Note  Patient Details  Name: KATELAN HIRT MRN: [id number] Date of Birth: April 26, 1955 Referring Provider:   April Manson Cancer Associated Rehabilitation & Exercise from 08/12/2021 in [facility] Cardiac and Pulmonary Rehab  Referring Provider [name] MD       Encounter Date: 10/23/2021  Check In:  Session Check In - 10/23/21 1224       Check-In   Supervising physician immediately available to respond to emergencies See telemetry face sheet for immediately available ER MD    Location [facility]-Cardiac & Pulmonary Rehab    Staff Present Alberteen Sam, MA, RCEP, CCRP, Marylynn Pearson, MS, ASCM CEP, Exercise Physiologist    Virtual Visit No    Medication changes reported     No    Fall or balance concerns reported    No    Warm-up and Cool-down Performed on first and last piece of equipment    Resistance Training Performed Yes    VAD Patient? No    PAD/SET Patient? No      Pain Assessment   Currently in Pain? No/denies                Social History   Tobacco Use  Smoking Status Never  Smokeless Tobacco Never    Goals Met:  Proper associated with RPD/PD & O2 Sat Independence with exercise equipment Exercise tolerated well No report of concerns or symptoms today Strength training completed today  Goals Unmet:  Not Applicable  Comments: Pt able to follow exercise prescription today without complaint.  Will continue to monitor for progression.    Dr. Emily Filbert is Medical Director for [place].  Dr. Ottie Glazier is Medical Director for [facility] Pulmonary Rehabilitation.

## 2021-10-27 ENCOUNTER — Other Ambulatory Visit: Payer: Self-pay

## 2021-10-28 ENCOUNTER — Telehealth: Payer: Self-pay

## 2021-10-28 DIAGNOSIS — Z171 Estrogen receptor negative status [ER-]: Secondary | ICD-10-CM

## 2021-10-28 NOTE — Telephone Encounter (Signed)
Calling to speak to one of Dr Elroy Channel nurses [phone]

## 2021-10-28 NOTE — Progress Notes (Signed)
Daily Session Note  Patient Details  Name: Anna Stewart MRN: [id number] Date of Birth: 28-Sep-1955 Referring Provider:   April Manson Cancer Associated Rehabilitation & Exercise from 08/12/2021 in [facility] Cardiac and Pulmonary Rehab  Referring Provider [name] MD       Encounter Date: 10/28/2021  Check In:  Session Check In - 10/28/21 1226       Check-In   Supervising physician immediately available to respond to emergencies See telemetry face sheet for immediately available ER MD    Location [facility]-Cardiac & Pulmonary Rehab    Staff Present Will Bonnet, RN,BC,MSN;Jessica [place], [place], RCEP, CCRP, Marylynn Pearson, MS, ASCM CEP, Exercise Physiologist    Virtual Visit No    Medication changes reported     No    Fall or balance concerns reported    No    Tobacco Cessation No Change    Warm-up and Cool-down Performed on first and last piece of equipment    Resistance Training Performed Yes    VAD Patient? No    PAD/SET Patient? No      Pain Assessment   Currently in Pain? No/denies    Multiple Pain Sites No                Social History   Tobacco Use  Smoking Status Never  Smokeless Tobacco Never    Goals Met:  Independence with exercise equipment Exercise tolerated well No report of concerns or symptoms today Strength training completed today  Goals Unmet:  Not Applicable  Comments: Pt able to follow exercise prescription today without complaint.  Will continue to monitor for progression.    Dr. Emily Filbert is Medical Director for [place].  Dr. Ottie Glazier is Medical Director for [facility] Pulmonary Rehabilitation.

## 2021-10-30 DIAGNOSIS — C50512 Malignant neoplasm of lower-outer quadrant of left female breast: Secondary | ICD-10-CM

## 2021-10-30 NOTE — Progress Notes (Signed)
Daily Session Note  Patient Details  Name: Anna Stewart MRN: [id number] Date of Birth: 1955-07-05 Referring Provider:   April Manson Cancer Associated Rehabilitation & Exercise from 08/12/2021 in [facility] Cardiac and Pulmonary Rehab  Referring Provider [name] MD       Encounter Date: 10/30/2021  Check In:  Session Check In - 10/30/21 1251       Check-In   Supervising physician immediately available to respond to emergencies See telemetry face sheet for immediately available ER MD    Location [facility]-Cardiac & Pulmonary Rehab    Staff Present Coralie Keens, MS, ASCM CEP, Exercise Physiologist;Jessica Luan Pulling, MA, RCEP, CCRP, CCET    Virtual Visit No    Medication changes reported     No    Fall or balance concerns reported    No    Warm-up and Cool-down Performed on first and last piece of equipment    Resistance Training Performed Yes   yoga   VAD Patient? No                Social History   Tobacco Use  Smoking Status Never  Smokeless Tobacco Never    Goals Met:  Independence with exercise equipment Exercise tolerated well No report of concerns or symptoms today Strength training completed today  Goals Unmet:  Not Applicable  Comments: Pt able to follow exercise prescription today without complaint.  Will continue to monitor for progression.   Yoga completed today.   Dr. Emily Filbert is Medical Director for [place].  Dr. Ottie Glazier is Medical Director for [facility] Pulmonary Rehabilitation.

## 2021-11-04 ENCOUNTER — Encounter: Payer: 59 | Attending: Family Medicine

## 2021-11-04 DIAGNOSIS — C50512 Malignant neoplasm of lower-outer quadrant of left female breast: Secondary | ICD-10-CM | POA: Insufficient documentation

## 2021-11-04 DIAGNOSIS — Z171 Estrogen receptor negative status [ER-]: Secondary | ICD-10-CM | POA: Insufficient documentation

## 2021-11-04 NOTE — Progress Notes (Signed)
Daily Session Note  Patient Details  Name: Anna Stewart MRN: [id number] Date of Birth: [birth year] Referring Provider:   April Manson Cancer Associated Rehabilitation & Exercise from 08/12/2021 in [facility] Cardiac and Pulmonary Rehab  Referring Provider [name] MD       Encounter Date: 11/04/2021  Check In:  Session Check In - 11/04/21 1248       Check-In   Supervising physician immediately available to respond to emergencies See telemetry face sheet for immediately available ER MD    Location [facility]-Cardiac & Pulmonary Rehab    Staff Present Antionette Fairy, BS, Exercise Physiologist;Susanne Bice, RN, BSN, Jacklynn Bue, MS, ASCM CEP, Exercise Physiologist    Virtual Visit No    Medication changes reported     No    Fall or balance concerns reported    No    Tobacco Cessation No Change    Warm-up and Cool-down Performed on first and last piece of equipment    Resistance Training Performed Yes    VAD Patient? No    PAD/SET Patient? No      Pain Assessment   Currently in Pain? No/denies    Multiple Pain Sites No                Social History   Tobacco Use  Smoking Status Never  Smokeless Tobacco Never    Goals Met:  Independence with exercise equipment Exercise tolerated well No report of concerns or symptoms today  Goals Unmet:  Not Applicable  Comments: Pt able to follow exercise prescription today without complaint.  Will continue to monitor for progression.    Dr. Emily Filbert is Medical Director for [place].  Dr. Ottie Glazier is Medical Director for [facility] Pulmonary Rehabilitation.

## 2021-11-06 ENCOUNTER — Inpatient Hospital Stay: Payer: 59 | Attending: Oncology | Admitting: Oncology

## 2021-11-06 ENCOUNTER — Encounter: Payer: Self-pay | Admitting: Oncology

## 2021-11-06 VITALS — BP 133/84 | HR 98 | Temp 98.5°F | Resp 18 | Wt 178.1 lb

## 2021-11-06 DIAGNOSIS — Z808 Family history of malignant neoplasm of other organs or systems: Secondary | ICD-10-CM | POA: Diagnosis not present

## 2021-11-06 DIAGNOSIS — I10 Essential (primary) hypertension: Secondary | ICD-10-CM | POA: Diagnosis not present

## 2021-11-06 DIAGNOSIS — Z08 Encounter for follow-up examination after completed treatment for malignant neoplasm: Secondary | ICD-10-CM | POA: Diagnosis not present

## 2021-11-06 DIAGNOSIS — Z801 Family history of malignant neoplasm of trachea, bronchus and lung: Secondary | ICD-10-CM | POA: Diagnosis not present

## 2021-11-06 DIAGNOSIS — Z9221 Personal history of antineoplastic chemotherapy: Secondary | ICD-10-CM | POA: Diagnosis not present

## 2021-11-06 DIAGNOSIS — Z923 Personal history of irradiation: Secondary | ICD-10-CM | POA: Insufficient documentation

## 2021-11-06 DIAGNOSIS — Z853 Personal history of malignant neoplasm of breast: Secondary | ICD-10-CM | POA: Diagnosis present

## 2021-11-06 NOTE — Progress Notes (Signed)
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  This was followed by diagnostic mammogram and ultrasound which showed a 7 x 7 x 5 mm hypoechoic mass in the 7 o'clock position of the left breast 8 cm from the nipple.  Ultrasound of the left axilla demonstrates 1 normal-appearing left axillary lymph nodes including 1 with borderline diffuse cortical thickening measuring 3.8 mm in maximum thickness.  No  Lymph nodes with focal or eccentric cortical thickening were seen.  The suspicious breast mass was biopsied and was consistent with triple negative grade 3 invasive mammary carcinoma.   1 cm invasive mammary carcinoma that was grade 2 with negative margins.  1 sentinel lymph node was negative for malignancy.  Patient underwent lumpectomy with sentinel lymph node biopsy.  Final pathology showed 1.1 cm grade 2 invasive mammary carcinoma with negative margins.  1 sentinel lymph node was negative for malignancy.  PT1c  pN 0.     Patient completed adjuvant AC Taxol chemotherapy on 12/10/2020.  She also completed adjuvant radiation treatment.  Interval  history-she is doing well overall.  Denies any new complaints at this time.  Appetite and weight have remained stable.  Denies any new aches and pains anywhere.  She had mild baseline neuropathy even prior to starting chemotherapy which is overall stable  ECOG PS- 1 Pain scale- 0   Review of systems- Review of Systems  Constitutional:  Negative for chills, fever, malaise/fatigue and weight loss.  HENT:  Negative for congestion, ear discharge and nosebleeds.   Eyes:  Negative for blurred vision.  Respiratory:  Negative for cough, hemoptysis, sputum production, shortness of breath and wheezing.   Cardiovascular:  Negative for chest pain, palpitations, orthopnea and claudication.  Gastrointestinal:  Negative for abdominal pain, blood in stool, constipation, diarrhea, heartburn, melena, nausea and vomiting.  Genitourinary:  Negative for dysuria, flank pain, frequency, hematuria and urgency.  Musculoskeletal:  Negative for back pain, joint pain and myalgias.  Skin:  Negative for rash.  Neurological:  Negative for dizziness, tingling, focal weakness, seizures, weakness and headaches.  Endo/Heme/Allergies:  Does not bruise/bleed easily.  Psychiatric/Behavioral:  Negative for depression and suicidal ideas. The patient does not have insomnia.       Allergies  Allergen Reactions   Keppra [Levetiracetam]    Tegretol [Carbamazepine]      Past Medical History:  Diagnosis Date   Allergy    AR (allergic rhinitis)    Arthritis    Cancer ([place]) 06/2020   right breast [facility]   Family history of brain cancer    Family history of lung cancer    Hair loss    Hypertension    x 5years   Migraine    Personal history of chemotherapy  2022 breast CA   Personal history of radiation therapy    2022 breast CA   Psoriasis    Seizure ([place])    x 17years   Seizure disorder (H[facility])    Vitamin D deficiency      Past Surgical History:  Procedure Laterality Date   BREAST BIOPSY Right    benign ?  date-before 2006   BREAST BIOPSY Left 05/30/2020   [place] bx, coil marker, path pending   BREAST LUMPECTOMY Left    03/22 Invasive ductal CA  Neg margins   BREAST LUMPECTOMY WITH RADIOACTIVE SEED AND SENTINEL LYMPH NODE BIOPSY Left 06/24/2020   Procedure: LEFT BREAST LUMPECTOMY WITH RADIOACTIVE SEED AND LEFT AXILLARY SENTINEL LYMPH NODE BIOPSY;  Surgeon: Rolm Bookbinder, MD;  Location: [place];  Service: General;  Laterality: Left;   [place] REMOVAL Right 03/17/2021   Procedure: REMOVAL PORT-A-CATH;  Surgeon: Rolm Bookbinder, MD;  Location: [place];  Service: General;  Laterality: Right;   PORTACATH PLACEMENT Right 06/24/2020   Procedure: INSERTION PORT-A-CATH;  Surgeon: Rolm Bookbinder, MD;  Location: [place];  Service: General;  Laterality: Right;   TONSILLECTOMY     TUBAL LIGATION      Social History   Socioeconomic History   Marital status: Married    Spouse name: Event organiser   Number of children: 3   Years of education: College   Highest education level: Not on file  Occupational History   Occupation:      Employer: OTHER    Comment: [employer]  Tobacco Use   Smoking status: Never   Smokeless tobacco: Never  Vaping Use   Vaping Use: Never used  Substance and Sexual Activity   Alcohol use: Yes    Comment: Rarely- wine   Drug use: No   Sexual activity: Yes    Birth control/protection: Post-menopausal  Other Topics Concern   Not on file  Social History Narrative   Patient lives at home with her family.   Caffeine Use: 1 cups of coffee daily   Social Determinants of Health   Financial Resource Strain: Not on file  Food Insecurity: Not on file  Transportation Needs: Not on file  Physical Activity: Not on file  Stress: Not on file  Social Connections: Not on file  Intimate Partner Violence: Not on file    Family History  Problem  Relation Age of Onset   Brain cancer Mother 57       tumor   Diabetes Father    Heart disease Father    Lung cancer Father    Cancer Paternal Aunt    Diabetes Maternal Grandmother    Stroke Maternal Grandfather    Breast cancer Neg Hx      Current Outpatient Medications:    cetirizine (ZYRTEC) 5 MG tablet, , Disp: , Rfl:    fluticasone (FLONASE) 50 MCG/ACT nasal spray, Place 2 sprays into both nostrils daily., Disp: , Rfl:    ibuprofen (ADVIL) 800 MG tablet, Take 800 mg by mouth 3 (three) times daily as needed., Disp: , Rfl:    LAMICTAL XR 200 MG TB24, Take 1 tablet (200 mg total) by mouth 2 (two) times daily. Brand Medically Necessary, Disp: 180 tablet, Rfl: 3   LamoTRIgine 200 MG TB24 24 hour tablet, Take 1 tablet by mouth 2 (two) times daily., Disp: , Rfl:    lisinopril-hydrochlorothiazide (  ZESTORETIC) 10-12.5 MG tablet, Take 1 tablet by mouth daily. Pt states 1/2 tablet a day, Disp: , Rfl:    pantoprazole (PROTONIX) 40 MG tablet, Take 40 mg by mouth daily., Disp: , Rfl:    cyclobenzaprine (FLEXERIL) 5 MG tablet, Take 5 mg by mouth 3 (three) times daily. (Patient not taking: Reported on 11/06/2021), Disp: , Rfl:    diclofenac Sodium (VOLTAREN) 1 % GEL, SMARTSIG:2 Gram(s) Topical 4 Times Daily PRN, Disp: , Rfl:    lidocaine (XYLOCAINE) 2 % solution, SMARTSIG:By Mouth (Patient not taking: Reported on 11/06/2021), Disp: , Rfl:    predniSONE (DELTASONE) 20 MG tablet, Take 20 mg by mouth 2 (two) times daily. (Patient not taking: Reported on 11/06/2021), Disp: , Rfl:    traMADol (ULTRAM) 50 MG tablet, Take 1 tablet (50 mg total) by mouth every 6 (six) hours as needed. (Patient not taking: Reported on 08/11/2021), Disp: 30 tablet, Rfl: 0  Physical exam:  Vitals:   11/06/21 0856  BP: 133/84  Pulse: 98  Resp: 18  Temp: 98.5 F (36.9 C)  SpO2: 98%  Weight: 178 lb 1.6 oz (80.8 kg)   Physical Exam Cardiovascular:     Rate and Rhythm: Normal rate and regular rhythm.     Heart sounds: Normal  heart sounds.  Pulmonary:     Effort: Pulmonary effort is normal.     Breath sounds: Normal breath sounds.  Abdominal:     General: Bowel sounds are normal.     Palpations: Abdomen is soft.  Skin:    General: Skin is warm and dry.  Neurological:     Mental Status: She is alert and oriented to person, place, and time.    Breast exam was performed in seated and lying down position. Patient is status post left lumpectomy with a well-healed surgical scar. No evidence of any palpable masses. No evidence of axillary adenopathy. No evidence of any palpable masses or lumps in the right breast. No evidence of right axillary adenopathy     Latest Ref Rng & Units 03/11/2021   10:40 AM  CMP  Glucose 70 - 99 mg/dL 110   BUN 8 - 23 mg/dL 15   Creatinine 0.44 - 1.00 mg/dL 0.71   Sodium 135 - 145 mmol/L 139   Potassium 3.5 - 5.1 mmol/L 4.5   Chloride 98 - 111 mmol/L 101   CO2 22 - 32 mmol/L 28   Calcium 8.9 - 10.3 mg/dL 9.6   Total Protein 6.5 - 8.1 g/dL 7.1   Total Bilirubin 0.3 - 1.2 mg/dL 0.3   Alkaline Phos 38 - 126 U/L 90   AST 15 - 41 U/L 20   ALT 0 - 44 U/L 24       Latest Ref Rng & Units 03/11/2021   10:40 AM  CBC  WBC 4.0 - 10.5 K/uL 4.5   Hemoglobin 12.0 - 15.0 g/dL 13.2   Hematocrit 36.0 - 46.0 % 40.3   Platelets 150 - 400 K/uL 240     Assessment and plan- Patient is a 66 y.o. female  with pathological prognostic stage Ib invasive mammary carcinoma triple negative pT1 cN0 M0 s/p lumpectomy and sentinel lymph node biopsy.  This was followed by adjuvant chemotherapy and radiation and she is here for routine follow-up  Clinically patient is doing well with no concerning signs and symptoms of recurrence based on today's exam.  She will be due for a diagnostic mammogram in January 2024 which is already scheduled.  I will  see her back in 6 months no labs   Visit Diagnosis 1. Encounter for follow-up surveillance of breast cancer      Dr. [name], MD, MPH [facility] at [place] [facility] [phone] 11/06/2021 12:36 PM

## 2021-11-07 ENCOUNTER — Other Ambulatory Visit: Payer: Self-pay

## 2021-11-07 ENCOUNTER — Ambulatory Visit: Payer: 59 | Admitting: Oncology

## 2021-11-13 ENCOUNTER — Encounter: Payer: 59 | Admitting: *Deleted

## 2021-11-13 DIAGNOSIS — C50512 Malignant neoplasm of lower-outer quadrant of left female breast: Secondary | ICD-10-CM

## 2021-11-13 NOTE — Progress Notes (Signed)
Daily Session Note  Patient Details  Name: DAKIYA PUOPOLO MRN: [id number] Date of Birth: [birth year] Referring Provider:   April Manson Cancer Associated Rehabilitation & Exercise from 08/12/2021 in [facility] Cardiac and Pulmonary Rehab  Referring Provider [name] MD       Encounter Date: 11/13/2021  Check In:  Session Check In - 11/13/21 1237       Check-In   Supervising physician immediately available to respond to emergencies See telemetry face sheet for immediately available ER MD    Location [facility]-Cardiac & Pulmonary Rehab    Staff Present Alberteen Sam, MA, RCEP, CCRP, CCET;Noah Tickle, BS, Exercise Physiologist;Kara Eliezer Bottom, MS, ASCM CEP, Exercise Physiologist    Virtual Visit No    Medication changes reported     No    Fall or balance concerns reported    No    Tobacco Cessation No Change    Warm-up and Cool-down Performed on first and last piece of equipment    Resistance Training Performed Yes    VAD Patient? No    PAD/SET Patient? No      Pain Assessment   Currently in Pain? No/denies                Social History   Tobacco Use  Smoking Status Never  Smokeless Tobacco Never    Goals Met:  Proper associated with RPD/PD & O2 Sat Independence with exercise equipment Exercise tolerated well No report of concerns or symptoms today Strength training completed today  Goals Unmet:  Not Applicable  Comments: Pt able to follow exercise prescription today without complaint.  Will continue to monitor for progression.    Dr. Emily Filbert is Medical Director for [place].  Dr. Ottie Glazier is Medical Director for [facility] Pulmonary Rehabilitation.

## 2021-11-18 VITALS — Ht 68.5 in | Wt 179.0 lb

## 2021-11-18 DIAGNOSIS — Z171 Estrogen receptor negative status [ER-]: Secondary | ICD-10-CM

## 2021-11-18 NOTE — Progress Notes (Addendum)
Daily Session Note  Patient Details  Name: Anna Stewart MRN: [id number] Date of Birth: [birth year] Referring Provider:   April Manson Cancer Associated Rehabilitation & Exercise from 08/12/2021 in [facility] Cardiac and Pulmonary Rehab  Referring Provider [name] MD       Encounter Date: 11/18/2021  Check In:  Session Check In - 11/18/21 1240       Check-In   Supervising physician immediately available to respond to emergencies See telemetry face sheet for immediately available ER MD    Location [facility]-Cardiac & Pulmonary Rehab    Staff Present Alberteen Sam, MA, RCEP, CCRP, CCET;Rmani Kapusta, BS, Exercise Physiologist;Kara Eliezer Bottom, MS, ASCM CEP, Exercise Physiologist    Virtual Visit No    Medication changes reported     No    Warm-up and Cool-down Performed on first and last piece of equipment    Resistance Training Performed Yes    VAD Patient? No    PAD/SET Patient? No      Pain Assessment   Currently in Pain? No/denies    Multiple Pain Sites No               6 Minute Walk     Row Name 08/12/21 1513 11/18/21 1355       6 Minute Walk   Phase Initial Discharge    Distance 1360 feet 1610 feet    Distance % Change -- 18.3 %    Distance Feet Change -- 250 ft    Walk Time 6 minutes 6 minutes    # of Rest Breaks 0 0    MPH 2.57 3.04    METS 3.65 4.23    RPE 10 12    Perceived Dyspnea  1 0    VO2 Peak 12.78 14.81    Symptoms No No    Resting HR 103 bpm 140 bpm    Resting BP 106/62 124/72    Resting Oxygen Saturation  95 % 96 %    Exercise Oxygen Saturation  during 6 min walk 94 % 96 %    Max Ex. HR 123 bpm 140 bpm    Max Ex. BP 138/64 132/72    2 Minute Post BP 112/66 --              Social History   Tobacco Use  Smoking Status Never  Smokeless Tobacco Never    Goals Met:  Independence with exercise equipment Exercise tolerated well No report of concerns or symptoms today  Goals Unmet:  Not Applicable  Comments: Pt able to follow  exercise prescription today without complaint.  Will continue to monitor for progression.    Dr. Emily Filbert is Medical Director for [place].  Dr. Ottie Glazier is Medical Director for [facility] Pulmonary Rehabilitation.

## 2021-11-18 NOTE — Patient Instructions (Signed)
CARE Discharge Patient Instructions  Patient Details  Name: Anna Stewart MRN: [id number] Date of Birth: October 13, 1955 Referring Provider:  Derinda Late, MD   Number of Visits: 23  Reason for Discharge:  Patient reached a stable level of exercise. Patient independent in their exercise. Patient has met program and personal goals.  Smoking History:  Social History   Tobacco Use  Smoking Status Never  Smokeless Tobacco Never    Diagnosis:  Malignant neoplasm of lower-outer quadrant of left breast of female, estrogen receptor negative ([place])  Initial Exercise Prescription:  Initial Exercise Prescription - 08/12/21 1500       Date of Initial Exercise RX and Referring Provider   Date 08/12/21    Referring Provider [name] MD      Oxygen   Maintain Oxygen Saturation 88% or higher      Treadmill   MPH 2.6    Grade 0.5    Minutes 15    METs 3.17      NuStep   Level 2    SPM 80    Minutes 15    METs 3.6      REL-XR   Level 2    Speed 50    Minutes 15    METs 3.6      Prescription Details   Frequency (times per week) 2    Duration Progress to 30 minutes of continuous aerobic without signs/symptoms of physical distress      Intensity   THRR 40-80% of Max Heartrate 124- 145    Ratings of Perceived Exertion 11-13    Perceived Dyspnea 0-4      Progression   Progression Continue to progress workloads to maintain intensity without signs/symptoms of physical distress.      Resistance Training   Training Prescription Yes    Weight 3 lb    Reps 10-15             Discharge Exercise Prescription (Final Exercise Prescription Changes):  Exercise Prescription Changes - 11/18/21 1300       Response to Exercise   Blood Pressure (Admit) 124/73    Blood Pressure (Exercise) 132/72    Blood Pressure (Exit) 108/68    Heart Rate (Admit) 100 bpm    Heart Rate (Exercise) 135 bpm    Heart Rate (Exit) 117 bpm    Oxygen Saturation (Admit) 96 %    Oxygen  Saturation (Exercise) 93 %    Oxygen Saturation (Exit) 96 %    Rating of Perceived Exertion (Exercise) 12    Symptoms none    Comments end of the program    Duration Continue with 30 min of aerobic exercise without signs/symptoms of physical distress.    Intensity THRR unchanged      Progression   Progression Continue to progress workloads to maintain intensity without signs/symptoms of physical distress.    Average METs 3.95      Resistance Training   Training Prescription Yes    Weight 5 lb    Reps 10-15      Interval Training   Interval Training No      Treadmill   MPH 3    Grade 2    Minutes 15    METs 4.12      NuStep   Level 4    Minutes 15    METs 2.6      REL-XR   Level 8    Minutes 15    METs 3.6  Home Exercise Plan   Plans to continue exercise at [place] 3 additional days to program exercise sessions.      Oxygen   Maintain Oxygen Saturation 88% or higher             Functional Capacity:  6 Minute Walk     Row Name 08/12/21 1513 11/18/21 1355       6 Minute Walk   Phase Initial Discharge    Distance 1360 feet 1610 feet    Distance % Change -- 18.3 %    Distance Feet Change -- 250 ft    Walk Time 6 minutes 6 minutes    # of Rest Breaks 0 0    MPH 2.57 3.04    METS 3.65 4.23    RPE 10 12    Perceived Dyspnea  1 0    VO2 Peak 12.78 14.81    Symptoms No No    Resting HR 103 bpm 140 bpm    Resting BP 106/62 124/72    Resting Oxygen Saturation  95 % 96 %    Exercise Oxygen Saturation  during 6 min walk 94 % 96 %    Max Ex. HR 123 bpm 140 bpm    Max Ex. BP 138/64 132/72    2 Minute Post BP 112/66 --              Nutrition & Weight - Outcomes:  Pre Biometrics - 08/12/21 1508       Pre Biometrics   Height 5' 8.5" (1.74 m)    Weight 182 lb 4.8 oz (82.7 kg)    BMI (Calculated) 27.31    Single Leg Stand 8.2 seconds             Post Biometrics - 11/18/21 1356        Post   Biometrics   Height 5' 8.5" (1.74 m)    Weight 179 lb (81.2 kg)    BMI (Calculated) 26.82             Goals reviewed with patient; copy given to patient.

## 2021-11-20 DIAGNOSIS — C50512 Malignant neoplasm of lower-outer quadrant of left female breast: Secondary | ICD-10-CM

## 2021-11-20 NOTE — Progress Notes (Signed)
Daily Session Note  Patient Details  Name: Anna Stewart MRN: [id number] Date of Birth: 17-Nov-1955 Referring Provider:   April Manson Cancer Associated Rehabilitation & Exercise from 08/12/2021 in [facility] Cardiac and Pulmonary Rehab  Referring Provider [name] MD       Encounter Date: 11/20/2021  Check In:  Session Check In - 11/20/21 1228       Check-In   Supervising physician immediately available to respond to emergencies See telemetry face sheet for immediately available ER MD    Location [facility]-Cardiac & Pulmonary Rehab    Staff Present Antionette Fairy, BS, Exercise Physiologist;Joseph [place], RCP,RRT,BSRT;Kara [place], MS, ASCM CEP, Exercise Physiologist    Virtual Visit No    Medication changes reported     No    Fall or balance concerns reported    No    Tobacco Cessation No Change    Warm-up and Cool-down Performed on first and last piece of equipment    Resistance Training Performed Yes    VAD Patient? No    PAD/SET Patient? No      Pain Assessment   Currently in Pain? No/denies    Multiple Pain Sites No                Social History   Tobacco Use  Smoking Status Never  Smokeless Tobacco Never    Goals Met:  Independence with exercise equipment Exercise tolerated well No report of concerns or symptoms today  Goals Unmet:  Not Applicable  Comments: Pt able to follow exercise prescription today without complaint.  Will continue to monitor for progression.    Dr. Emily Filbert is Medical Director for [name].  Dr. Ottie Glazier is Medical Director for [name] Pulmonary Rehabilitation.

## 2021-11-25 ENCOUNTER — Encounter: Payer: 59 | Admitting: *Deleted

## 2021-11-25 DIAGNOSIS — Z171 Estrogen receptor negative status [ER-]: Secondary | ICD-10-CM

## 2021-11-25 NOTE — Progress Notes (Signed)
Daily Session Note  Patient Details  Name: Anna Stewart MRN: [id number] Date of Birth: [birth year] Referring Provider:   April Manson Cancer Associated Rehabilitation & Exercise from 08/12/2021 in [facility] Cardiac and Pulmonary Rehab  Referring Provider [name] MD       Encounter Date: 11/25/2021  Check In:  Session Check In - 11/25/21 1224       Check-In   Supervising physician immediately available to respond to emergencies See telemetry face sheet for immediately available ER MD    Location [facility]-Cardiac & Pulmonary Rehab    Staff Present Antionette Fairy, BS, Exercise Physiologist;Kara Eliezer Bottom, MS, ASCM CEP, Exercise Physiologist;Ariyana Faw Luan [place], MA, RCEP, CCRP, CCET    Virtual Visit No    Medication changes reported     No    Fall or balance concerns reported    No    Warm-up and Cool-down Performed on first and last piece of equipment    Resistance Training Performed Yes    VAD Patient? No    PAD/SET Patient? No      Pain Assessment   Currently in Pain? No/denies                Social History   Tobacco Use  Smoking Status Never  Smokeless Tobacco Never    Goals Met:  Proper associated with RPD/PD & O2 Sat Independence with exercise equipment Exercise tolerated well No report of concerns or symptoms today Strength training completed today  Goals Unmet:  Not Applicable  Comments: Pt able to follow exercise prescription today without complaint.  Will continue to monitor for progression.   Persephanie graduated today from  rehab with 23 sessions completed.  Details of the patient's exercise prescription and what She needs to do in order to continue the prescription and progress were discussed with patient.  Patient was given a copy of prescription and goals.  Patient verbalized understanding.  [place]ia plans to continue to exercise by walking and going to [employer].    Dr. Emily Filbert is Medical Director for [place].  Dr. Ottie Glazier is Medical Director for [facility] Pulmonary Rehabilitation.

## 2021-11-25 NOTE — Progress Notes (Signed)
CARE Discharge Progress Report  Patient Details  Name: Anna Stewart MRN: [id number] Date of Birth: 1955-04-21 Referring Provider:   April [facility] from 08/12/2021 in [facility]  Referring Provider [name] MD        Number of Visits: 23  Reason for Discharge:  Patient reached a stable level of exercise. Patient independent in their exercise. Patient has met program and personal goals.  Smoking History:  Social History   Tobacco Use  Smoking Status Never  Smokeless Tobacco Never    Diagnosis:  Malignant neoplasm of lower-outer quadrant of left breast of female, estrogen receptor negative ([place])    Initial Exercise Prescription:  Initial Exercise Prescription - 08/12/21 1500       Date of Initial Exercise RX and Referring Provider   Date 08/12/21    Referring Provider [name] MD      Oxygen   Maintain Oxygen Saturation 88% or higher      Treadmill   MPH 2.6    Grade 0.5    Minutes 15    METs 3.17      NuStep   Level 2    SPM 80    Minutes 15    METs 3.6      REL-XR   Level 2    Speed 50    Minutes 15    METs 3.6      Prescription Details   Frequency (times per week) 2    Duration Progress to 30 minutes of continuous aerobic without signs/symptoms of physical distress      Intensity   THRR 40-80% of Max Heartrate 124- 145    Ratings of Perceived Exertion 11-13    Perceived Dyspnea 0-4      Progression   Progression Continue to progress workloads to maintain intensity without signs/symptoms of physical distress.      Resistance Training   Training Prescription Yes    Weight 3 lb    Reps 10-15             Discharge Exercise Prescription (Final Exercise Prescription Changes):  Exercise Prescription Changes - 11/18/21 1300       Response to Exercise   Blood Pressure (Admit) 124/73    Blood Pressure (Exercise) 132/72    Blood Pressure (Exit) 108/68     Heart Rate (Admit) 100 bpm    Heart Rate (Exercise) 135 bpm    Heart Rate (Exit) 117 bpm    Oxygen Saturation (Admit) 96 %    Oxygen Saturation (Exercise) 93 %    Oxygen Saturation (Exit) 96 %    Rating of Perceived Exertion (Exercise) 12    Symptoms none    Comments end of the program    Duration Continue with 30 min of aerobic exercise without signs/symptoms of physical distress.    Intensity THRR unchanged      Progression   Progression Continue to progress workloads to maintain intensity without signs/symptoms of physical distress.    Average METs 3.95      Resistance Training   Training Prescription Yes    Weight 5 lb    Reps 10-15      Interval Training   Interval Training No      Treadmill   MPH 3    Grade 2    Minutes 15    METs 4.12      NuStep   Level 4    Minutes 15    METs  2.6      REL-XR   Level 8    Minutes 15    METs 3.6      Home Exercise Plan   Plans to continue exercise at [place] 3 additional days to program exercise sessions.      Oxygen   Maintain Oxygen Saturation 88% or higher             Functional Capacity:  6 Minute Walk     Row Name 08/12/21 1513 11/18/21 1355       6 Minute Walk   Phase Initial Discharge    Distance 1360 feet 1610 feet    Distance % Change -- 18.3 %    Distance Feet Change -- 250 ft    Walk Time 6 minutes 6 minutes    # of Rest Breaks 0 0    MPH 2.57 3.04    METS 3.65 4.23    RPE 10 12    Perceived Dyspnea  1 0    VO2 Peak 12.78 14.81    Symptoms No No    Resting HR 103 bpm 140 bpm    Resting BP 106/62 124/72    Resting Oxygen Saturation  95 % 96 %    Exercise Oxygen Saturation  during 6 min walk 94 % 96 %    Max Ex. HR 123 bpm 140 bpm    Max Ex. BP 138/64 132/72    2 Minute Post BP 112/66 --              Nutrition & Weight - Outcomes:  Pre Biometrics - 08/12/21 1508       Pre Biometrics   Height 5' 8.5" (1.74 m)    Weight 182 lb 4.8 oz (82.7  kg)    BMI (Calculated) 27.31    Single Leg Stand 8.2 seconds             Post Biometrics - 11/18/21 1356        Post  Biometrics   Height 5' 8.5" (1.74 m)    Weight 179 lb (81.2 kg)    BMI (Calculated) 26.82            Goals reviewed with patient; copy given to patient.

## 2022-02-25 ENCOUNTER — Ambulatory Visit: Payer: 59 | Admitting: Radiation Oncology

## 2022-03-10 IMAGING — MG MM PLC BREAST LOC DEV 1ST LESION INC MAMMO GUIDE*L*
7 series · 7 of 7 positions shown · non-contrast
Comparison: Previous exam(s).

CLINICAL DATA: Recently diagnosed LEFT breast cancer scheduled for
lumpectomy requiring preoperative radioactive seed localization.

EXAM:
MAMMOGRAPHIC GUIDED RADIOACTIVE SEED LOCALIZATION OF THE LEFT BREAST

[L ML (1 of 4)]
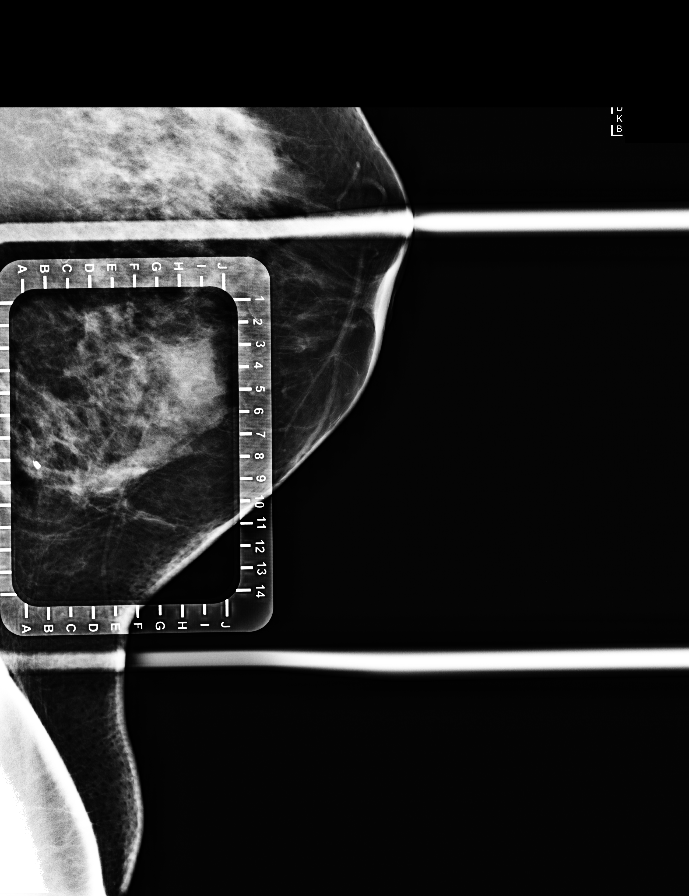

[L CC (1 of 3)]
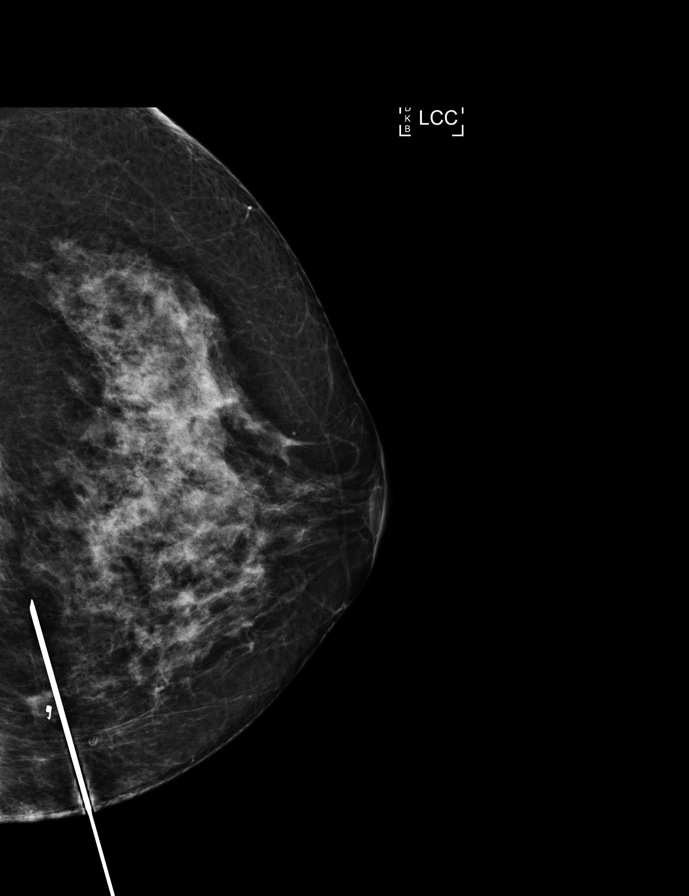

[L ML (2 of 4)]
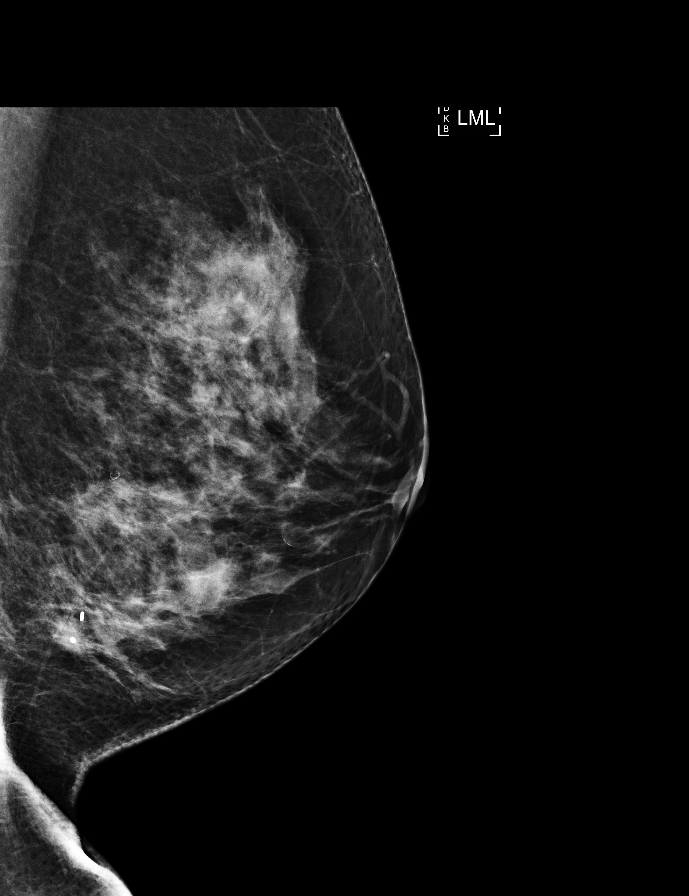

[L CC (2 of 3)]
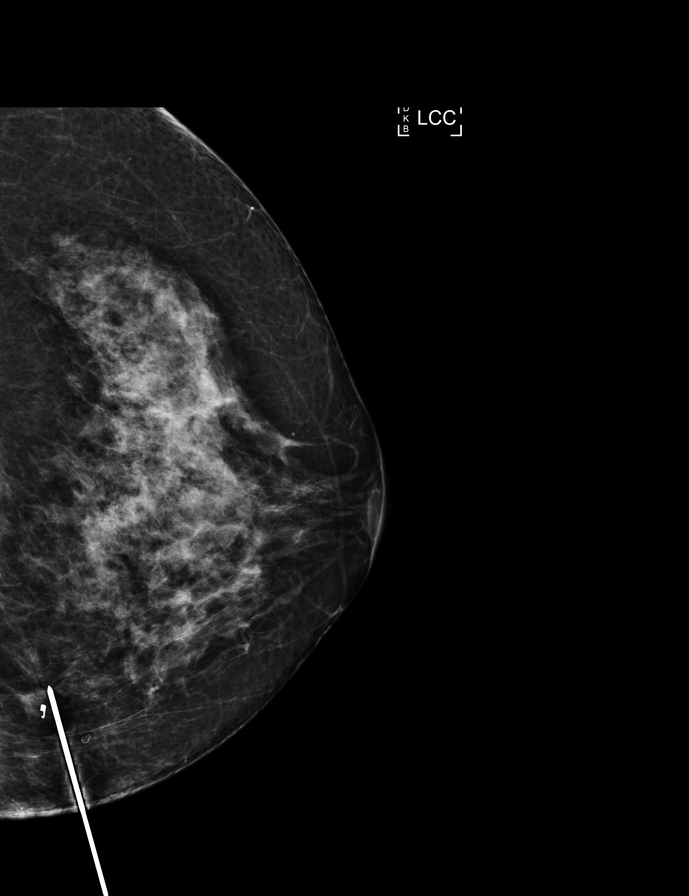

[L CC (3 of 3)]
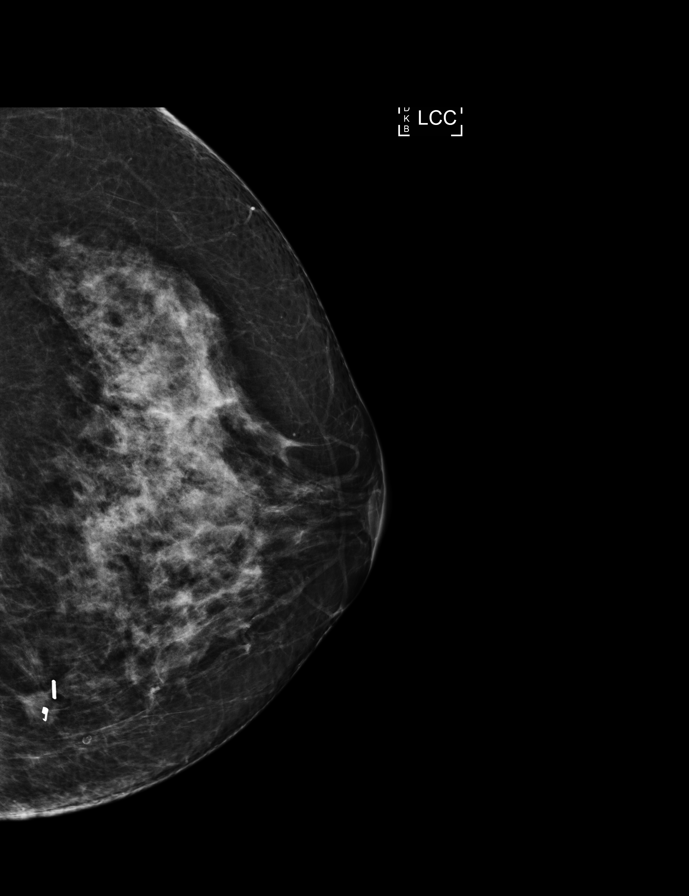

[L ML (3 of 4)]
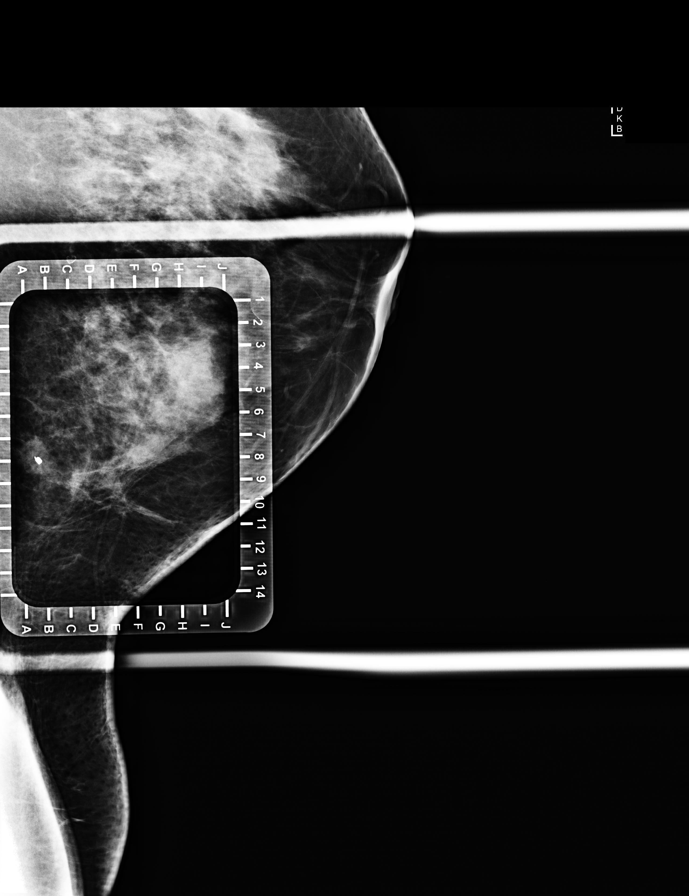

[L ML (4 of 4)]
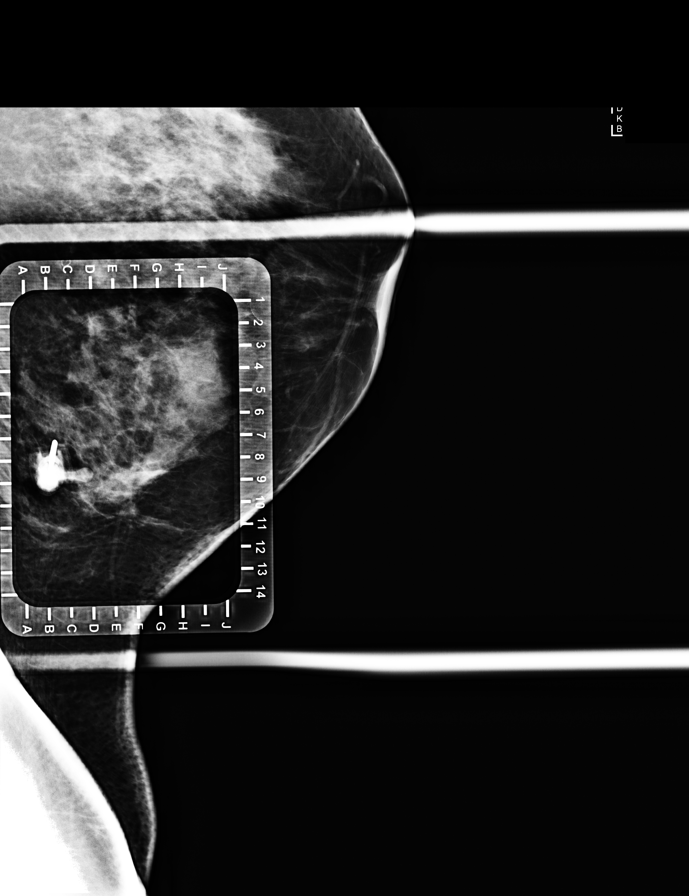

[7 of 7 positions shown; findings below may reference images not displayed]

FINDINGS: Patient presents for radioactive seed localization prior to
lumpectomy. I met with the patient and we discussed the procedure of
seed localization including benefits and alternatives. We discussed
the high likelihood of a successful procedure. We discussed the
risks of the procedure including infection, bleeding, tissue injury
and further surgery. We discussed the low dose of radioactivity
involved in the procedure. Informed, written consent was given.

The usual time-out protocol was performed immediately prior to the
procedure.

Using mammographic guidance, sterile technique, 1% lidocaine and an
S-CRP radioactive seed, the mass with coil shaped clip in the lower
inner quadrant of the LEFT breast was localized using a medial
approach. The follow-up mammogram images confirm the seed in the
expected location and were marked for Dr. Taran.

Follow-up survey of the patient confirms presence of the radioactive
seed.

Order number of S-CRP seed:  [id number].

Total activity:  0.242 millicuries reference Date: 06/06/2020

The patient tolerated the procedure well and was released from the
[REDACTED]. She was given instructions regarding seed removal.
IMPRESSION: Radioactive seed localization left breast. No apparent
complications.

## 2022-03-13 IMAGING — RF DG CHEST 1V
1 series · 1 of 1 positions shown · non-contrast
Comparison: None.

CLINICAL DATA: Surgery, Port-A-Cath insertion.

EXAM:
DG C-ARM 1-60 MIN; CHEST  1 VIEW
FLUOROSCOPY TIME:  Fluoroscopy Time:  19 seconds
Radiation Exposure Index (if provided by the fluoroscopic device):
1.62 mGy
Number of Acquired Spot Images: 1

[Series 1: run · 1 of 1 slices shown]
[im 1/1]
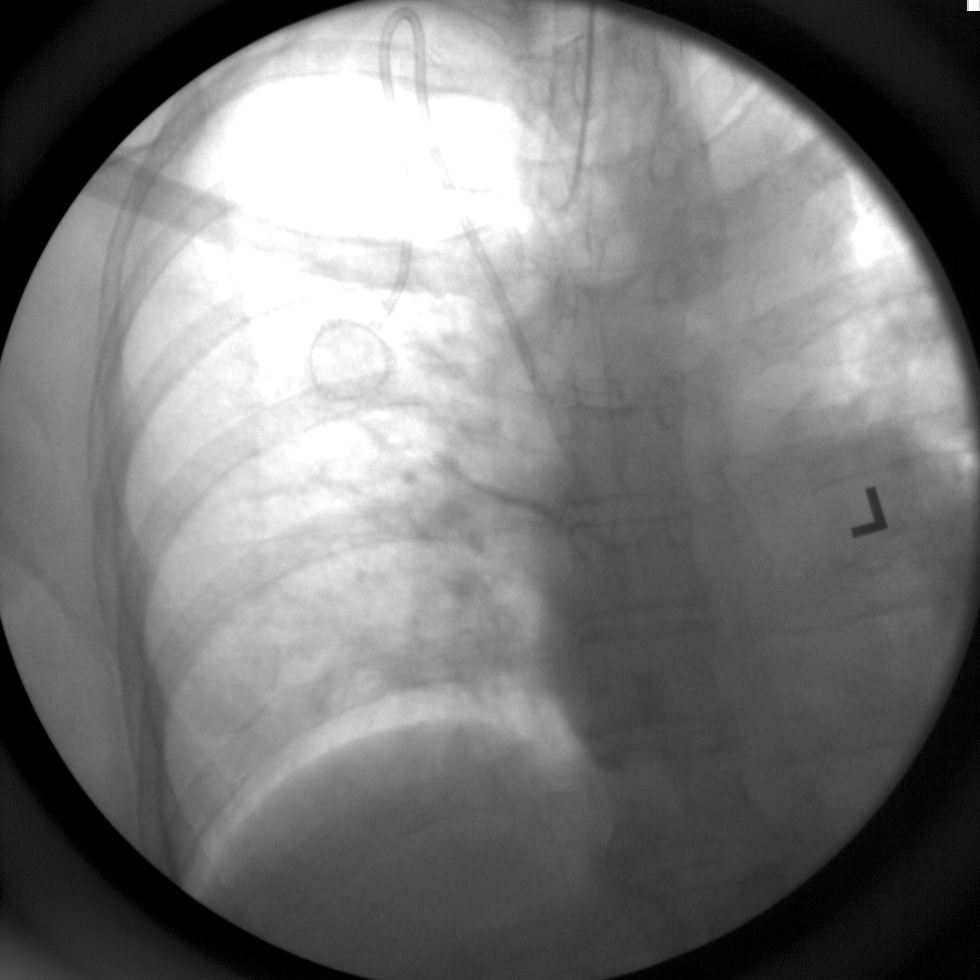

[1 of 1 positions shown; findings below may reference images not displayed]

FINDINGS: Single fluoroscopic spot view obtained in the operating room
demonstrates a right-sided chest port in place. The tip is in the
region of the mid SVC.
IMPRESSION: Procedural fluoroscopy during right chest port insertion.

## 2022-03-19 ENCOUNTER — Encounter: Payer: Self-pay | Admitting: Radiation Oncology

## 2022-03-19 ENCOUNTER — Ambulatory Visit
Admission: RE | Admit: 2022-03-19 | Discharge: 2022-03-19 | Disposition: A | Discharge status: Home / Self Care | Payer: 59 | Source: Ambulatory Visit | Attending: Radiation Oncology | Admitting: Radiation Oncology

## 2022-03-19 VITALS — BP 111/68 | HR 68 | Temp 98.0°F | Resp 16 | Ht 67.0 in | Wt 176.3 lb

## 2022-03-19 DIAGNOSIS — Z853 Personal history of malignant neoplasm of breast: Secondary | ICD-10-CM | POA: Diagnosis present

## 2022-03-19 DIAGNOSIS — Z171 Estrogen receptor negative status [ER-]: Secondary | ICD-10-CM

## 2022-03-19 DIAGNOSIS — Z923 Personal history of irradiation: Secondary | ICD-10-CM | POA: Diagnosis not present

## 2022-03-19 NOTE — Progress Notes (Signed)
Radiation Oncology Follow up Note  Name: Anna Stewart   Date:   03/19/2022 MRN:  [id number] DOB: [birth year]    This 66 y.o. 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  Seen today routine follow-up she is doing well.  She specifically denies breast tenderness cough or bone pain..  She had mammograms back in January which I reviewed were BI-RADS 2 benign.  She is not on endocrine therapy based on the triple negative nature of her disease.  COMPLICATIONS OF TREATMENT: none  FOLLOW UP COMPLIANCE: keeps appointments   PHYSICAL EXAM:  BP 111/68 (BP Location: Right Arm, Patient Position: Sitting, Cuff Size: Normal)   Pulse 68   Temp 98 F (36.7 C) (Tympanic)   Resp 16   Ht '5\' 7"'$  (1.702 m) Comment: stated ht  Wt 176 lb 4.8 oz (80 kg)   BMI 27.61 kg/m  Lungs are clear to A&P cardiac examination essentially unremarkable with regular rate and rhythm. No dominant mass or nodularity is noted in either breast in 2 positions examined. Incision is well-healed. No axillary or supraclavicular adenopathy is appreciated. Cosmetic result is excellent.  Well-developed well-nourished patient in NAD. HEENT reveals PERLA, EOMI, discs not visualized.  Oral cavity is clear. No oral mucosal lesions are identified. Neck is clear without evidence of cervical or supraclavicular adenopathy. Lungs are clear to A&P. Cardiac examination is essentially unremarkable with regular rate and rhythm without murmur rub or thrill. Abdomen is benign with no organomegaly or masses noted. Motor sensory and DTR levels are equal and symmetric in the upper and lower extremities. Cranial nerves II through  XII are grossly intact. Proprioception is intact. No peripheral adenopathy or edema is identified. No motor or sensory levels are noted. Crude visual fields are within normal range.  RADIOLOGY RESULTS: Mammograms reviewed compatible with above-stated findings  PLAN: The present time patient is doing well with no evidence of disease 1 year out from whole breast radiation and pleased with her overall progress.  I have asked to see her back in 1 year for follow-up.  She is already scheduled for follow-up mammograms.  Patient is to call with any concerns.  I would like to take this opportunity to thank you for allowing me to participate in the care of your patient.Noreene Filbert, MD

## 2022-03-20 ENCOUNTER — Other Ambulatory Visit: Payer: Self-pay

## 2022-04-24 ENCOUNTER — Encounter: Payer: Self-pay | Admitting: Oncology

## 2022-05-01 ENCOUNTER — Ambulatory Visit
Admission: RE | Admit: 2022-05-01 | Discharge: 2022-05-01 | Disposition: A | Discharge status: Home / Self Care | Payer: Medicare Other | Source: Ambulatory Visit | Attending: Oncology | Admitting: Oncology

## 2022-05-01 ENCOUNTER — Encounter: Payer: Self-pay | Admitting: Oncology

## 2022-05-01 DIAGNOSIS — Z171 Estrogen receptor negative status [ER-]: Secondary | ICD-10-CM | POA: Diagnosis present

## 2022-05-01 DIAGNOSIS — C50312 Malignant neoplasm of lower-inner quadrant of left female breast: Secondary | ICD-10-CM | POA: Diagnosis present

## 2022-05-11 ENCOUNTER — Inpatient Hospital Stay: Payer: Medicare Other | Attending: Oncology | Admitting: Oncology

## 2022-05-11 ENCOUNTER — Encounter: Payer: Self-pay | Admitting: Oncology

## 2022-05-11 VITALS — BP 134/80 | HR 84 | Temp 97.8°F | Resp 20 | Wt 177.5 lb

## 2022-05-11 DIAGNOSIS — Z08 Encounter for follow-up examination after completed treatment for malignant neoplasm: Secondary | ICD-10-CM

## 2022-05-11 DIAGNOSIS — Z801 Family history of malignant neoplasm of trachea, bronchus and lung: Secondary | ICD-10-CM | POA: Insufficient documentation

## 2022-05-11 DIAGNOSIS — Z923 Personal history of irradiation: Secondary | ICD-10-CM | POA: Diagnosis not present

## 2022-05-11 DIAGNOSIS — Z9221 Personal history of antineoplastic chemotherapy: Secondary | ICD-10-CM | POA: Diagnosis not present

## 2022-05-11 DIAGNOSIS — Z808 Family history of malignant neoplasm of other organs or systems: Secondary | ICD-10-CM | POA: Diagnosis not present

## 2022-05-11 DIAGNOSIS — I10 Essential (primary) hypertension: Secondary | ICD-10-CM | POA: Insufficient documentation

## 2022-05-11 DIAGNOSIS — Z853 Personal history of malignant neoplasm of breast: Secondary | ICD-10-CM | POA: Diagnosis present

## 2022-05-11 NOTE — Progress Notes (Signed)
Hematology/Oncology Consult note [facility]  [phone] [phone]  Patient Care Team: Derinda Late, MD as PCP - General (Family Medicine) Theodore Demark, RN (Inactive) as Oncology Nurse Navigator Sindy Guadeloupe, MD as Consulting Physician (Oncology) Rolm Bookbinder, MD as Consulting Physician (General Surgery) Noreene Filbert, MD as Consulting Physician (Radiation Oncology)   Name of the patient: Anna Stewart  [id number]  03-10-1956   Date of visit: 05/11/22  Diagnosis- pathological prognostic stage Ib invasive mammary carcinoma pT1 cpN0 cM0 ER/PR negative and HER-2 negative of the left breast    Chief complaint/ Reason for visit-routine follow-up of breast cancer  Heme/Onc history: Patient is a 67 year old female who underwent a routine screening bilateral mammogram on 04/25/2020 which suggested a possible mass in the left breast.  This was followed by diagnostic mammogram and ultrasound which showed a 7 x 7 x 5 mm hypoechoic mass in the 7 o'clock position of the left breast 8 cm from the nipple.  Ultrasound of the left axilla demonstrates 1 normal-appearing left axillary lymph nodes including 1 with borderline diffuse cortical thickening measuring 3.8 mm in maximum thickness.  No  Lymph nodes with focal or eccentric cortical thickening were seen.  The suspicious breast mass was biopsied and was consistent with triple negative grade 3 invasive mammary carcinoma.   1 cm invasive mammary carcinoma that was grade 2 with negative margins.  1 sentinel lymph node was negative for malignancy.  Patient underwent lumpectomy with sentinel lymph node biopsy.  Final pathology showed 1.1 cm grade 2 invasive mammary carcinoma with negative margins.  1 sentinel lymph node was negative for malignancy.  PT1c  pN 0.     Patient completed adjuvant AC Taxol chemotherapy on 12/10/2020.  She also completed adjuvant radiation treatment.  Interval  history-she is doing well overall.  She has baseline pain in her bilateral hands from rheumatoid arthritis.  Denies any breast concerns at this time  ECOG PS- 1 Pain scale- 3   Review of systems- Review of Systems  Constitutional:  Negative for chills, fever, malaise/fatigue and weight loss.  HENT:  Negative for congestion, ear discharge and nosebleeds.   Eyes:  Negative for blurred vision.  Respiratory:  Negative for cough, hemoptysis, sputum production, shortness of breath and wheezing.   Cardiovascular:  Negative for chest pain, palpitations, orthopnea and claudication.  Gastrointestinal:  Negative for abdominal pain, blood in stool, constipation, diarrhea, heartburn, melena, nausea and vomiting.  Genitourinary:  Negative for dysuria, flank pain, frequency, hematuria and urgency.  Musculoskeletal:  Positive for joint pain. Negative for back pain and myalgias.  Skin:  Negative for rash.  Neurological:  Negative for dizziness, tingling, focal weakness, seizures, weakness and headaches.  Endo/Heme/Allergies:  Does not bruise/bleed easily.  Psychiatric/Behavioral:  Negative for depression and suicidal ideas. The patient does not have insomnia.       Allergies  Allergen Reactions   Keppra [Levetiracetam]    Tegretol [Carbamazepine]      Past Medical History:  Diagnosis Date   Allergy    AR (allergic rhinitis)    Arthritis    Cancer ([place]) 06/2020   right breast [facility]   Family history of brain cancer    Family history of lung cancer    Hair loss    Hypertension    x 5years   Migraine    Personal history of chemotherapy    2022 breast CA   Personal history of radiation therapy    2022 breast  CA   Psoriasis    Seizure ([place])    x 17years   Seizure disorder ([place])    Vitamin D deficiency      Past Surgical History:  Procedure Laterality Date   BREAST BIOPSY Right    benign ? date-before 2006   BREAST BIOPSY Left 05/30/2020   [place] bx, coil marker, path pending   BREAST  LUMPECTOMY Left    03/22 Invasive ductal CA  Neg margins   BREAST LUMPECTOMY WITH RADIOACTIVE SEED AND SENTINEL LYMPH NODE BIOPSY Left 06/24/2020   Procedure: LEFT BREAST LUMPECTOMY WITH RADIOACTIVE SEED AND LEFT AXILLARY SENTINEL LYMPH NODE BIOPSY;  Surgeon: Rolm Bookbinder, MD;  Location: [place];  Service: General;  Laterality: Left;   CESAREAN SECTION     CHOLECYSTECTOMY     KNEE SURGERY     PORT-A-CATH REMOVAL Right 03/17/2021   Procedure: REMOVAL PORT-A-CATH;  Surgeon: Rolm Bookbinder, MD;  Location: [place];  Service: General;  Laterality: Right;   PORTACATH PLACEMENT Right 06/24/2020   Procedure: INSERTION PORT-A-CATH;  Surgeon: Rolm Bookbinder, MD;  Location: [place];  Service: General;  Laterality: Right;   TONSILLECTOMY     TUBAL LIGATION      Social History   Socioeconomic History   Marital status: Married    Spouse name: Event organiser   Number of children: 3   Years of education: College   Highest education level: Not on file  Occupational History   Occupation:      Employer: OTHER    Comment: [employer]  Tobacco Use   Smoking status: Never   Smokeless tobacco: Never  Vaping Use   Vaping Use: Never used  Substance and Sexual Activity   Alcohol use: Yes    Comment: Rarely- wine   Drug use: No   Sexual activity: Yes    Birth control/protection: Post-menopausal  Other Topics Concern   Not on file  Social History Narrative   Patient lives at home with her family.   Caffeine Use: 1 cups of coffee daily   Social Determinants of Health   Financial Resource Strain: Not on file  Food Insecurity: Not on file  Transportation Needs: Not on file  Physical Activity: Not on file  Stress: Not on file  Social Connections: Not on file  Intimate Partner Violence: Not on file    Family History  Problem Relation Age of Onset   Brain cancer Mother 62       tumor   Diabetes Father    Heart  disease Father    Lung cancer Father    Cancer Paternal Aunt    Diabetes Maternal Grandmother    Stroke Maternal Grandfather    Breast cancer Neg Hx      Current Outpatient Medications:    cetirizine (ZYRTEC) 5 MG tablet, , Disp: , Rfl:    diclofenac Sodium (VOLTAREN) 1 % GEL, SMARTSIG:2 Gram(s) Topical 4 Times Daily PRN, Disp: , Rfl:    fluticasone (FLONASE) 50 MCG/ACT nasal spray, Place 2 sprays into both nostrils daily., Disp: , Rfl:    ibuprofen (ADVIL) 800 MG tablet, Take 800 mg by mouth 3 (three) times daily as needed., Disp: , Rfl:    LAMICTAL XR 200 MG TB24, Take 1 tablet (200 mg total) by mouth 2 (two) times daily. Brand Medically Necessary, Disp: 180 tablet, Rfl: 3   LamoTRIgine 200 MG TB24 24 hour tablet, Take 1 tablet by mouth 2 (two) times daily., Disp: , Rfl:  lisinopril-hydrochlorothiazide (ZESTORETIC) 10-12.5 MG tablet, Take 1 tablet by mouth daily. Pt states 1/2 tablet a day, Disp: , Rfl:    pantoprazole (PROTONIX) 40 MG tablet, Take 40 mg by mouth daily., Disp: , Rfl:    traMADol (ULTRAM) 50 MG tablet, Take 1 tablet (50 mg total) by mouth every 6 (six) hours as needed. (Patient not taking: Reported on 08/11/2021), Disp: 30 tablet, Rfl: 0  Physical exam:  Vitals:   05/11/22 1113  BP: 134/80  Pulse: 84  Resp: 20  Temp: 97.8 F (36.6 C)  SpO2: 100%  Weight: 177 lb 8 oz (80.5 kg)   Physical Exam Cardiovascular:     Rate and Rhythm: Normal rate and regular rhythm.     Heart sounds: Normal heart sounds.  Pulmonary:     Effort: Pulmonary effort is normal.     Breath sounds: Normal breath sounds.  Abdominal:     General: Bowel sounds are normal.     Palpations: Abdomen is soft.  Musculoskeletal:     Comments: Bilateral joint deformities noted in her hands  Skin:    General: Skin is warm and dry.  Neurological:     Mental Status: She is alert and oriented to person, place, and time.         Latest Ref Rng & Units 03/11/2021   10:40 AM  CMP  Glucose 70 -  99 mg/dL 110   BUN 8 - 23 mg/dL 15   Creatinine 0.44 - 1.00 mg/dL 0.71   Sodium 135 - 145 mmol/L 139   Potassium 3.5 - 5.1 mmol/L 4.5   Chloride 98 - 111 mmol/L 101   CO2 22 - 32 mmol/L 28   Calcium 8.9 - 10.3 mg/dL 9.6   Total Protein 6.5 - 8.1 g/dL 7.1   Total Bilirubin 0.3 - 1.2 mg/dL 0.3   Alkaline Phos 38 - 126 U/L 90   AST 15 - 41 U/L 20   ALT 0 - 44 U/L 24       Latest Ref Rng & Units 03/11/2021   10:40 AM  CBC  WBC 4.0 - 10.5 K/uL 4.5   Hemoglobin 12.0 - 15.0 g/dL 13.2   Hematocrit 36.0 - 46.0 % 40.3   Platelets 150 - 400 K/uL 240     No images are attached to the encounter.  MM DIAG BREAST TOMO BILATERAL  Result Date: 05/01/2022 CLINICAL DATA:  67 year old who underwent malignant lumpectomy of the LEFT breast in March, 2022, for a triple negative malignancy. Adjuvant radiation therapy and chemotherapy. Annual evaluation. EXAM: DIGITAL DIAGNOSTIC BILATERAL MAMMOGRAM WITH TOMOSYNTHESIS TECHNIQUE: Bilateral digital diagnostic mammography and breast tomosynthesis was performed. COMPARISON:  Previous exam(s). ACR Breast Density Category d: The breast tissue is extremely dense, which lowers the sensitivity of mammography. FINDINGS: Full field CC and MLO views of both breasts and a spot magnification MLO view of the lumpectomy site in the LEFT breast were obtained. RIGHT: No findings suspicious for malignancy. LEFT: No findings suspicious for malignancy. Scar/distortion at the lumpectomy site in the [place] at middle to posterior depth. IMPRESSION: 1. No mammographic evidence of malignancy involving either breast. 2. Expected post lumpectomy changes involving the LEFT breast. RECOMMENDATION: Per protocol, as the patient will soon be 2 years status post lumpectomy, she may return to annual screening mammography in 1 year. However, given the history of breast cancer, the patient remains eligible for annual diagnostic mammography if preferred. I have discussed the findings and  recommendations with the patient. If  applicable, a reminder letter will be sent to the patient regarding the next appointment. BI-RADS CATEGORY  2: Benign. Electronically Signed   By: Evangeline Dakin M.D.   On: 05/01/2022 11:55    Assessment and plan- Patient is a 67 y.o. female with pathological prognostic stage Ib invasive mammary carcinoma triple negative pT1 cN0 M0 s/p lumpectomy and sentinel lymph node biopsy.  This was followed by adjuvant chemotherapy and radiation .  This is a routine follow-up visit for breast cancer  Patient had a recent mammogram in January 2024 which was unremarkable.  Clinically no signs and symptoms of recurrence based on today's exam.  I will see her back in 6 months no labs.  She had triple negative disease and therefore not on endocrine therapy   Visit Diagnosis 1. Encounter for follow-up surveillance of breast cancer      Dr. [name], MD, MPH [facility] at [facility] [phone] 05/11/2022 3:41 PM

## 2022-11-08 ENCOUNTER — Other Ambulatory Visit: Payer: Self-pay | Admitting: *Deleted

## 2022-11-08 DIAGNOSIS — Z171 Estrogen receptor negative status [ER-]: Secondary | ICD-10-CM

## 2022-11-08 DIAGNOSIS — Z1231 Encounter for screening mammogram for malignant neoplasm of breast: Secondary | ICD-10-CM

## 2022-11-09 ENCOUNTER — Encounter: Payer: Self-pay | Admitting: Oncology

## 2022-11-09 ENCOUNTER — Inpatient Hospital Stay: Payer: Medicare Other | Attending: Oncology | Admitting: Oncology

## 2022-11-09 VITALS — BP 115/78 | HR 100 | Temp 98.0°F | Resp 18 | Ht 67.0 in | Wt 179.9 lb

## 2022-11-09 DIAGNOSIS — Z08 Encounter for follow-up examination after completed treatment for malignant neoplasm: Secondary | ICD-10-CM | POA: Diagnosis not present

## 2022-11-09 DIAGNOSIS — M069 Rheumatoid arthritis, unspecified: Secondary | ICD-10-CM | POA: Insufficient documentation

## 2022-11-09 DIAGNOSIS — M25519 Pain in unspecified shoulder: Secondary | ICD-10-CM | POA: Insufficient documentation

## 2022-11-09 DIAGNOSIS — Z171 Estrogen receptor negative status [ER-]: Secondary | ICD-10-CM | POA: Diagnosis not present

## 2022-11-09 DIAGNOSIS — Z808 Family history of malignant neoplasm of other organs or systems: Secondary | ICD-10-CM | POA: Insufficient documentation

## 2022-11-09 DIAGNOSIS — C50312 Malignant neoplasm of lower-inner quadrant of left female breast: Secondary | ICD-10-CM | POA: Insufficient documentation

## 2022-11-09 DIAGNOSIS — Z801 Family history of malignant neoplasm of trachea, bronchus and lung: Secondary | ICD-10-CM | POA: Insufficient documentation

## 2022-11-09 DIAGNOSIS — Z853 Personal history of malignant neoplasm of breast: Secondary | ICD-10-CM

## 2022-11-09 NOTE — Progress Notes (Signed)
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  This was followed by diagnostic mammogram and ultrasound which showed a 7 x 7 x 5 mm hypoechoic mass in the 7 o'clock position of the left breast 8 cm from the nipple.  Ultrasound of the left axilla demonstrates 1 normal-appearing left axillary lymph nodes including 1 with borderline diffuse cortical thickening measuring 3.8 mm in maximum thickness.  No  Lymph nodes with focal or eccentric cortical thickening were seen.  The suspicious breast mass was biopsied and was consistent with triple negative grade 3 invasive mammary carcinoma.   1 cm invasive mammary carcinoma that was grade 2 with negative margins.  1 sentinel lymph node was negative for malignancy.  Patient underwent lumpectomy with sentinel lymph node biopsy.  Final pathology showed 1.1 cm grade 2 invasive mammary carcinoma with negative margins.  1 sentinel lymph node was negative for malignancy.  PT1c  pN 0.     Patient completed adjuvant AC Taxol chemotherapy on 12/10/2020.  She also completed adjuvant radiation treatment.  Interval  history-she feels well overall.  Denies any breast concerns presently.  Denies any new aches and pains anywhere.  Appetite and weight have remained stable.  She does have occasional joint pains especially in her shoulder and her hands from rheumatoid arthritis which is overall stable  ECOG PS- 0 Pain scale- 0   Review of systems- Review of Systems  Constitutional:  Negative for chills, fever, malaise/fatigue and weight loss.  HENT:  Negative for congestion, ear discharge and nosebleeds.   Eyes:  Negative for blurred vision.  Respiratory:  Negative for cough, hemoptysis, sputum production, shortness of breath and wheezing.   Cardiovascular:  Negative for chest pain, palpitations, orthopnea and claudication.  Gastrointestinal:  Negative for abdominal pain, blood in stool, constipation, diarrhea, heartburn, melena, nausea and vomiting.  Genitourinary:  Negative for dysuria, flank pain, frequency, hematuria and urgency.  Musculoskeletal:  Negative for back pain, joint pain and myalgias.  Skin:  Negative for rash.  Neurological:  Negative for dizziness, tingling, focal weakness, seizures, weakness and headaches.  Endo/Heme/Allergies:  Does not bruise/bleed easily.  Psychiatric/Behavioral:  Negative for depression and suicidal ideas. The patient does not have insomnia.       Allergies  Allergen Reactions   Keppra [Levetiracetam]    Tegretol [Carbamazepine]      Past Medical History:  Diagnosis Date   Allergy    AR (allergic rhinitis)    Arthritis    Cancer (HCC) 06/2020   right breast [facility]   Family history of brain cancer    Family history of lung cancer    Hair loss    Hypertension    x 5years   Migraine    Personal history of chemotherapy  2022 breast CA   Personal history of radiation therapy    2022 breast CA   Psoriasis    Seizure (HCC)    x 17years   Seizure disorder (HCC)    Vitamin D deficiency      Past Surgical History:  Procedure Laterality Date   BREAST  BIOPSY Right    benign ? date-before 2006   BREAST BIOPSY Left 05/30/2020   [place] bx, coil marker, path pending   BREAST LUMPECTOMY Left    03/22 Invasive ductal CA  Neg margins   BREAST LUMPECTOMY WITH RADIOACTIVE SEED AND SENTINEL LYMPH NODE BIOPSY Left 06/24/2020   Procedure: LEFT BREAST LUMPECTOMY WITH RADIOACTIVE SEED AND LEFT AXILLARY SENTINEL LYMPH NODE BIOPSY;  Surgeon: Emelia Loron, MD;  Location: [facility];  Service: General;  Laterality: Left;   CESAREAN SECTION     CHOLECYSTECTOMY     KNEE SURGERY     PORT-A-CATH REMOVAL Right 03/17/2021   Procedure: REMOVAL PORT-A-CATH;  Surgeon: Emelia Loron, MD;  Location: [facility];  Service: General;  Laterality: Right;   PORTACATH PLACEMENT Right 06/24/2020   Procedure: INSERTION PORT-A-CATH;  Surgeon: Emelia Loron, MD;  Location: [facility];  Service: General;  Laterality: Right;   TONSILLECTOMY     TUBAL LIGATION      Social History   Socioeconomic History   Marital status: Married    Spouse name: Acupuncturist   Number of children: 3   Years of education: College   Highest education level: Not on file  Occupational History   Occupation:      Employer: OTHER    Comment: [employer]  Tobacco Use   Smoking status: Never   Smokeless tobacco: Never  Vaping Use   Vaping status: Never Used  Substance and Sexual Activity   Alcohol use: Yes    Comment: Rarely- wine   Drug use: No   Sexual activity: Yes    Birth control/protection: Post-menopausal  Other Topics Concern   Not on file  Social History Narrative   Patient lives at home with her family.   Caffeine Use: 1 cups of coffee daily   Social Determinants of Health   Financial Resource Strain: Low Risk  (09/28/2022)   Received from [facility]   Overall Financial Resource Strain (CARDIA)    Difficulty of Paying Living Expenses: Not hard at all  Food Insecurity: No Food Insecurity  (09/28/2022)   Received from [facility]   Hunger Vital Sign    Worried About Running Out of Food in the Last Year: Never true    Ran Out of Food in the Last Year: Never true  Transportation Needs: No Transportation Needs (09/28/2022)   [facility] from [facility] - Transportation    In the past 12 months, has lack of transportation kept you from medical appointments or from getting medications?: No    Lack of Transportation (Non-Medical): No  Physical Activity: Not on file  Stress: Not on file  Social Connections: Not on file  Intimate Partner Violence: Not on file    Family History  Problem Relation Age of Onset   Brain cancer Mother 52       tumor   Diabetes Father    Heart disease Father    Lung cancer Father    Cancer Paternal Aunt    Diabetes Maternal Grandmother    Stroke Maternal Grandfather    Breast cancer Neg Hx  Current Outpatient Medications:    cetirizine (ZYRTEC) 5 MG tablet, , Disp: , Rfl:    diclofenac Sodium (VOLTAREN) 1 % GEL, SMARTSIG:2 Gram(s) Topical 4 Times Daily PRN, Disp: , Rfl:    fluticasone (FLONASE) 50 MCG/ACT nasal spray, Place 2 sprays into both nostrils daily., Disp: , Rfl:    ibuprofen (ADVIL) 800 MG tablet, Take 800 mg by mouth 3 (three) times daily as needed., Disp: , Rfl:    LamoTRIgine 200 MG TB24 24 hour tablet, Take 1 tablet by mouth 2 (two) times daily., Disp: , Rfl:    lisinopril-hydrochlorothiazide (ZESTORETIC) 10-12.5 MG tablet, Take 1 tablet by mouth daily. Pt states 1/2 tablet a day, Disp: , Rfl:    pantoprazole (PROTONIX) 40 MG tablet, Take 40 mg by mouth daily., Disp: , Rfl:    LAMICTAL XR 200 MG TB24, Take 1 tablet (200 mg total) by mouth 2 (two) times daily. Brand Medically Necessary (Patient not taking: Reported on 11/09/2022), Disp: 180 tablet, Rfl: 3   traMADol (ULTRAM) 50 MG tablet, Take 1 tablet (50 mg total) by mouth every 6 (six) hours as needed. (Patient not taking: Reported on  08/11/2021), Disp: 30 tablet, Rfl: 0  Physical exam:  Vitals:   11/09/22 1113  BP: 115/78  Pulse: 100  Resp: 18  Temp: 98 F (36.7 C)  TempSrc: Tympanic  SpO2: 97%  Weight: 179 lb 14.4 oz (81.6 kg)  Height: 5\' 7"  (1.702 m)   Physical Exam Cardiovascular:     Rate and Rhythm: Normal rate and regular rhythm.     Heart sounds: Normal heart sounds.  Pulmonary:     Effort: Pulmonary effort is normal.     Breath sounds: Normal breath sounds.  Skin:    General: Skin is warm and dry.  Neurological:     Mental Status: She is alert and oriented to person, place, and time.    Breast exam was performed in seated and lying down position. Patient is status post left lumpectomy with a well-healed surgical scar. No evidence of any palpable masses. No evidence of axillary adenopathy. No evidence of any palpable masses or lumps in the right breast. No evidence of right axillary adenopathy      Latest Ref Rng & Units 03/11/2021   10:40 AM  CMP  Glucose 70 - 99 mg/dL 161   BUN 8 - 23 mg/dL 15   Creatinine 0.96 - 1.00 mg/dL 0.45   Sodium 409 - 811 mmol/L 139   Potassium 3.5 - 5.1 mmol/L 4.5   Chloride 98 - 111 mmol/L 101   CO2 22 - 32 mmol/L 28   Calcium 8.9 - 10.3 mg/dL 9.6   Total Protein 6.5 - 8.1 g/dL 7.1   Total Bilirubin 0.3 - 1.2 mg/dL 0.3   Alkaline Phos 38 - 126 U/L 90   AST 15 - 41 U/L 20   ALT 0 - 44 U/L 24       Latest Ref Rng & Units 03/11/2021   10:40 AM  CBC  WBC 4.0 - 10.5 K/uL 4.5   Hemoglobin 12.0 - 15.0 g/dL 91.4   Hematocrit 78.2 - 46.0 % 40.3   Platelets 150 - 400 K/uL 240     Assessment and plan- Patient is a 67 y.o. female with pathological prognostic stage Ib invasive mammary carcinoma triple negative pT1 cN0 M0 s/p lumpectomy and sentinel lymph node biopsy.  This was followed by adjuvant chemotherapy and radiation .  She is here for routine follow-up visit for  breast cancer  Clinically patient is doing well with no concerning signs and symptoms of  recurrence based on today's exam.  We are now nearing 2 years from completion of adjuvant chemotherapy in September 2022.  Labs from June 2024 were unremarkable.  Mammogram from January was unremarkable.  I will see her back in 6 months no labs   Visit Diagnosis 1. Encounter for follow-up surveillance of breast cancer      Dr. Owens Shark, MD, MPH [facility] at [facility] [phone] 11/09/2022 12:36 PM

## 2022-11-11 ENCOUNTER — Other Ambulatory Visit: Payer: Self-pay

## 2023-02-18 ENCOUNTER — Inpatient Hospital Stay: Payer: Medicare Other | Attending: Oncology

## 2023-02-18 NOTE — Progress Notes (Signed)
[facility] Healthcare Advance Directives Clinical Social Work  Patient presented to Advance Directives Clinic  to review and complete healthcare advance directives.  Clinical Social Worker met with patient.  The patient designated Tinnie Gens "Lorin Picket" Bennett as their primary healthcare agent and [employer] as their secondary agent.  Patient also completed healthcare living will.    Documents were notarized and copies made for patient/family. Clinical Social Worker will send documents to medical records to be scanned into patient's chart. Clinical Social Worker encouraged patient/family to contact with any additional questions or concerns.   Dorothey Baseman, LCSW Clinical Social Worker [facility]

## 2023-03-24 ENCOUNTER — Ambulatory Visit: Payer: 59 | Admitting: Radiation Oncology

## 2023-03-25 ENCOUNTER — Encounter: Payer: Self-pay | Admitting: Radiation Oncology

## 2023-03-25 ENCOUNTER — Ambulatory Visit
Admission: RE | Admit: 2023-03-25 | Discharge: 2023-03-25 | Disposition: A | Discharge status: Home / Self Care | Payer: Medicare Other | Source: Ambulatory Visit | Attending: Radiation Oncology | Admitting: Radiation Oncology

## 2023-03-25 VITALS — BP 136/82 | HR 87 | Temp 96.7°F | Resp 14 | Ht 67.0 in | Wt 186.7 lb

## 2023-03-25 DIAGNOSIS — C50312 Malignant neoplasm of lower-inner quadrant of left female breast: Secondary | ICD-10-CM

## 2023-03-25 DIAGNOSIS — Z923 Personal history of irradiation: Secondary | ICD-10-CM | POA: Diagnosis not present

## 2023-03-25 DIAGNOSIS — Z853 Personal history of malignant neoplasm of breast: Secondary | ICD-10-CM | POA: Diagnosis present

## 2023-03-25 NOTE — Progress Notes (Signed)
Radiation Oncology Follow up Note  Name: Anna Stewart   Date:   03/25/2023 MRN:  [id number] DOB: 1956/03/27    This 67 y.o. female presents to the clinic today for 2-year follow-up status post whole breast radiation to her left breast for stage Ib invasive mammary carcinoma triple negative.  REFERRING PROVIDER: Kandyce Rud, MD  HPI: Patient is a 68 year old female now out 2 years having pleated whole breast radiation to her left breast for stage Ib invasive mammary carcinoma triple negative.  Seen today in routine follow-up she is doing well specifically denies breast tenderness cough or bone pain.  She does have some pulling sensation in her left upper flank unknown etiology although certainly could be associated with some scar tissue from her surgery..  Based on the triple negative nature of her disease she is not on endocrine therapy.  She has mammograms next month which I will review when they are available.  Her last mammograms were BI-RADS 2 benign  COMPLICATIONS OF TREATMENT: none  FOLLOW UP COMPLIANCE: keeps appointments   PHYSICAL EXAM:  BP 136/82   Pulse 87   Temp (!) 96.7 F (35.9 C) (Tympanic)   Resp 14   Ht 5\' 7"  (1.702 m)   Wt 186 lb 11.2 oz (84.7 kg)   BMI 29.24 kg/m  Lungs are clear to A&P cardiac examination essentially unremarkable with regular rate and rhythm. No dominant mass or nodularity is noted in either breast in 2 positions examined. Incision is well-healed. No axillary or supraclavicular adenopathy is appreciated. Cosmetic result is excellent.  Did not detect anything in her left posterior back where she is complaining of tightness.  Well-developed well-nourished patient in NAD. HEENT reveals PERLA, EOMI, discs not visualized.  Oral cavity is clear. No oral mucosal lesions are identified. Neck is clear without evidence of cervical or supraclavicular adenopathy. Lungs are clear to A&P. Cardiac examination is essentially unremarkable with regular rate  and rhythm without murmur rub or thrill. Abdomen is benign with no organomegaly or masses noted. Motor sensory and DTR levels are equal and symmetric in the upper and lower extremities. Cranial nerves II through XII are grossly intact. Proprioception is intact. No peripheral adenopathy or edema is identified. No motor or sensory levels are noted. Crude visual fields are within normal range.  RADIOLOGY RESULTS: Previous mammograms reviewed we will review her mammograms next month when they are available  PLAN: Present time patient is doing well now out 2 years with no evidence of disease.  On pleased with her overall progress.  I will see her back 1 more time in a year and then discontinue follow-up care.  Patient knows to call with any concerns.  I would like to take this opportunity to thank you for allowing me to participate in the care of your patient.Carmina Miller, MD

## 2023-03-26 ENCOUNTER — Other Ambulatory Visit: Payer: Self-pay

## 2023-05-03 ENCOUNTER — Ambulatory Visit
Admission: RE | Admit: 2023-05-03 | Discharge: 2023-05-03 | Disposition: A | Discharge status: Home / Self Care | Payer: Medicare Other | Source: Ambulatory Visit | Attending: Oncology | Admitting: Oncology

## 2023-05-03 DIAGNOSIS — C50312 Malignant neoplasm of lower-inner quadrant of left female breast: Secondary | ICD-10-CM | POA: Diagnosis present

## 2023-05-03 DIAGNOSIS — Z171 Estrogen receptor negative status [ER-]: Secondary | ICD-10-CM | POA: Insufficient documentation

## 2023-05-12 ENCOUNTER — Encounter: Payer: Self-pay | Admitting: Oncology

## 2023-05-12 ENCOUNTER — Inpatient Hospital Stay: Payer: Medicare Other | Attending: Oncology | Admitting: Oncology

## 2023-05-12 VITALS — BP 125/79 | HR 86 | Temp 96.1°F | Resp 17 | Wt 184.0 lb

## 2023-05-12 DIAGNOSIS — M1990 Unspecified osteoarthritis, unspecified site: Secondary | ICD-10-CM | POA: Diagnosis not present

## 2023-05-12 DIAGNOSIS — Z08 Encounter for follow-up examination after completed treatment for malignant neoplasm: Secondary | ICD-10-CM

## 2023-05-12 DIAGNOSIS — Z923 Personal history of irradiation: Secondary | ICD-10-CM | POA: Diagnosis not present

## 2023-05-12 DIAGNOSIS — G8929 Other chronic pain: Secondary | ICD-10-CM | POA: Insufficient documentation

## 2023-05-12 DIAGNOSIS — Z853 Personal history of malignant neoplasm of breast: Secondary | ICD-10-CM

## 2023-05-12 DIAGNOSIS — Z9221 Personal history of antineoplastic chemotherapy: Secondary | ICD-10-CM | POA: Insufficient documentation

## 2023-05-12 DIAGNOSIS — Z808 Family history of malignant neoplasm of other organs or systems: Secondary | ICD-10-CM | POA: Insufficient documentation

## 2023-05-12 DIAGNOSIS — Z801 Family history of malignant neoplasm of trachea, bronchus and lung: Secondary | ICD-10-CM | POA: Diagnosis not present

## 2023-05-12 NOTE — Progress Notes (Signed)
 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  This was followed by diagnostic mammogram and ultrasound which showed a 7 x 7 x 5 mm hypoechoic mass in the 7 o'clock position of the left breast 8 cm from the nipple.  Ultrasound of the left axilla demonstrates 1 normal-appearing left axillary lymph nodes including 1 with borderline diffuse cortical thickening measuring 3.8 mm in maximum thickness.  No  Lymph nodes with focal or eccentric cortical thickening were seen.  The suspicious breast mass was biopsied and was consistent with triple negative grade 3 invasive mammary carcinoma.   1 cm invasive mammary carcinoma that was grade 2 with negative margins.  1 sentinel lymph node was negative for malignancy.  Patient underwent lumpectomy with sentinel lymph node biopsy.  Final pathology showed 1.1 cm grade 2 invasive mammary carcinoma with negative margins.  1 sentinel lymph node was negative for malignancy.  PT1c  pN 0.     Patient completed adjuvant AC Taxol  chemotherapy on 12/10/2020.  She also completed adjuvant radiation treatment.     Interval history-she feels well overall.  Appetite and weight have remained stable.  Denies any new aches and pains anywhere.  She has chronic joint pain from osteoarthritis which is stable  ECOG PS- 0 Pain scale- 2   Review of systems- Review of Systems  Constitutional:  Negative for chills, fever, malaise/fatigue and weight loss.  HENT:  Negative for congestion, ear discharge and nosebleeds.   Eyes:  Negative for blurred vision.  Respiratory:  Negative for cough, hemoptysis, sputum production, shortness of breath and wheezing.   Cardiovascular:  Negative for chest pain, palpitations, orthopnea and claudication.  Gastrointestinal:  Negative for abdominal pain, blood in stool, constipation, diarrhea, heartburn, melena, nausea and vomiting.  Genitourinary:  Negative for dysuria, flank pain, frequency, hematuria and urgency.  Musculoskeletal:  Negative for back pain, joint pain and myalgias.  Skin:  Negative for rash.  Neurological:  Negative for dizziness, tingling, focal weakness, seizures, weakness and headaches.  Endo/Heme/Allergies:  Does not bruise/bleed easily.  Psychiatric/Behavioral:  Negative for depression and suicidal ideas. The patient does not have insomnia.       Allergies  Allergen Reactions   Keppra [Levetiracetam]    Tegretol [Carbamazepine]      Past Medical History:  Diagnosis Date   Allergy    AR (allergic rhinitis)    Arthritis    Cancer (HCC) 06/2020   right breast [facility]   Family history of brain cancer    Family history of lung cancer    Hair loss    Hypertension    x 5years   Migraine    Personal history of chemotherapy    2022 breast CA   Personal history of  radiation therapy    2022 breast CA   Psoriasis    Seizure (HCC)    x 17years   Seizure disorder (HCC)    Vitamin D deficiency      Past Surgical History:  Procedure Laterality Date   BREAST BIOPSY Right    benign ? date-before 2006   BREAST BIOPSY Left 05/30/2020   us  bx, coil  marker,  INVASIVE MAMMARY CARCINOMA, NO SPECIAL TYPE. Size of invasive carcinoma: 10 mm in this sample. Grade 3. Ductal carcinoma in situ: Not identified. Lymphovascular invasion: Not identified.   BREAST LUMPECTOMY Left    03/22 Invasive ductal CA  Neg margins   BREAST LUMPECTOMY WITH RADIOACTIVE SEED AND SENTINEL LYMPH NODE BIOPSY Left 06/24/2020   Procedure: LEFT BREAST LUMPECTOMY WITH RADIOACTIVE SEED AND LEFT AXILLARY SENTINEL LYMPH NODE BIOPSY;  Surgeon: Ebbie Cough, MD;  Location: [facility];  Service: General;  Laterality: Left;   CESAREAN SECTION     CHOLECYSTECTOMY     KNEE SURGERY     PORT-A-CATH REMOVAL Right 03/17/2021   Procedure: REMOVAL PORT-A-CATH;  Surgeon: Ebbie Cough, MD;  Location: [facility];  Service: General;  Laterality: Right;   PORTACATH PLACEMENT Right 06/24/2020   Procedure: INSERTION PORT-A-CATH;  Surgeon: Ebbie Cough, MD;  Location: [facility];  Service: General;  Laterality: Right;   TONSILLECTOMY     TUBAL LIGATION      Social History   Socioeconomic History   Marital status: Married    Spouse name: Acupuncturist   Number of children: 3   Years of education: College   Highest education level: Not on file  Occupational History   Occupation:      Employer: OTHER    Comment: [employer]  Tobacco Use   Smoking status: Never   Smokeless tobacco: Never  Vaping Use   Vaping status: Never Used  Substance and Sexual Activity   Alcohol use: Yes    Comment: Rarely- wine   Drug use: No   Sexual activity: Yes    Birth control/protection: Post-menopausal  Other Topics Concern   Not on file  Social History Narrative   Patient lives at home with her family.   Caffeine Use: 1 cups of coffee daily   Social Drivers of Corporate Investment Banker Strain: Low Risk  (09/28/2022)   Received from [facility]   Overall Financial Resource Strain (CARDIA)    Difficulty of  Paying Living Expenses: Not hard at all  Food Insecurity: No Food Insecurity (09/28/2022)   Received from [facility]   Hunger Vital Sign    Worried About Running Out of Food in the Last Year: Never true    Ran Out of Food in the Last Year: Never true  Transportation Needs: No Transportation Needs (09/28/2022)   Received from [facility] - Transportation    In the past 12 months, has lack of transportation kept you from medical appointments or from getting medications?: No    Lack of Transportation (Non-Medical): No  Physical Activity: Not on file  Stress: Not on file  Social Connections: Not on file  Intimate Partner Violence: Not on file    Family History  Problem Relation Age of Onset   Brain cancer Mother 53       tumor   Diabetes Father    Heart disease Father    Lung cancer Father    Cancer Paternal Aunt  Diabetes Maternal Grandmother    Stroke Maternal Grandfather    Breast cancer Neg Hx      Current Outpatient Medications:    cetirizine (ZYRTEC) 5 MG tablet, , Disp: , Rfl:    cyclobenzaprine (FLEXERIL) 10 MG tablet, Take 10 mg by mouth 3 (three) times daily., Disp: , Rfl:    diclofenac Sodium (VOLTAREN) 1 % GEL, SMARTSIG:2 Gram(s) Topical 4 Times Daily PRN, Disp: , Rfl:    escitalopram (LEXAPRO) 10 MG tablet, Take 10 mg by mouth., Disp: , Rfl:    fluticasone (FLONASE) 50 MCG/ACT nasal spray, Place 2 sprays into both nostrils daily., Disp: , Rfl:    Gabapentin, Once-Daily, 600 MG TABS, Take 600 mg by mouth at bedtime., Disp: , Rfl:    ibuprofen (ADVIL) 800 MG tablet, Take 800 mg by mouth 3 (three) times daily as needed., Disp: , Rfl:    lisinopril-hydrochlorothiazide (ZESTORETIC) 10-12.5 MG tablet, Take 1 tablet by mouth daily. Pt states 1/2 tablet a day, Disp: , Rfl:    meloxicam (MOBIC) 15 MG tablet, Take 15 mg by mouth daily., Disp: , Rfl:    pantoprazole (PROTONIX) 40 MG tablet, Take 40 mg by mouth daily., Disp: , Rfl:     LAMICTAL  XR 200 MG TB24, Take 1 tablet (200 mg total) by mouth 2 (two) times daily. Brand Medically Necessary (Patient not taking: Reported on 11/09/2022), Disp: 180 tablet, Rfl: 3   LamoTRIgine  200 MG TB24 24 hour tablet, Take 1 tablet by mouth 2 (two) times daily. (Patient not taking: Reported on 05/12/2023), Disp: , Rfl:    traMADol  (ULTRAM ) 50 MG tablet, Take 1 tablet (50 mg total) by mouth every 6 (six) hours as needed. (Patient not taking: Reported on 08/11/2021), Disp: 30 tablet, Rfl: 0  Physical exam:  Vitals:   05/12/23 1041  BP: 125/79  Pulse: 86  Resp: 17  Temp: (!) 96.1 F (35.6 C)  TempSrc: Tympanic  SpO2: 96%  Weight: 184 lb (83.5 kg)   Physical Exam Cardiovascular:     Rate and Rhythm: Normal rate and regular rhythm.     Heart sounds: Normal heart sounds.  Pulmonary:     Effort: Pulmonary effort is normal.     Breath sounds: Normal breath sounds.  Skin:    General: Skin is warm and dry.  Neurological:     Mental Status: She is alert and oriented to person, place, and time.    Breast exam was performed in seated and lying down position. Patient is status post left lumpectomy with a well-healed surgical scar. No evidence of any palpable masses. No evidence of axillary adenopathy. No evidence of any palpable masses or lumps in the right breast. No evidence of right axillary adenopathy      Latest Ref Rng & Units 03/11/2021   10:40 AM  CMP  Glucose 70 - 99 mg/dL 889   BUN 8 - 23 mg/dL 15   Creatinine 9.55 - 1.00 mg/dL 9.28   Sodium 864 - 854 mmol/L 139   Potassium 3.5 - 5.1 mmol/L 4.5   Chloride 98 - 111 mmol/L 101   CO2 22 - 32 mmol/L 28   Calcium 8.9 - 10.3 mg/dL 9.6   Total Protein 6.5 - 8.1 g/dL 7.1   Total Bilirubin 0.3 - 1.2 mg/dL 0.3   Alkaline Phos 38 - 126 U/L 90   AST 15 - 41 U/L 20   ALT 0 - 44 U/L 24       Latest Ref Rng &  Units 03/11/2021   10:40 AM  CBC  WBC 4.0 - 10.5 K/uL 4.5   Hemoglobin 12.0 - 15.0 g/dL 86.7   Hematocrit 63.9 - 46.0 %  40.3   Platelets 150 - 400 K/uL 240     No images are attached to the encounter.  MM DIAG BREAST TOMO BILATERAL Result Date: 05/03/2023 CLINICAL DATA:  Status post left lumpectomy in March 2022 for triple negative malignancy, with negative margins. Patient received adjuvant radiation therapy and chemotherapy. Annual examination. EXAM: DIGITAL DIAGNOSTIC BILATERAL MAMMOGRAM WITH TOMOSYNTHESIS AND CAD TECHNIQUE: Bilateral digital diagnostic mammography and breast tomosynthesis was performed. The images were evaluated with computer-aided detection. COMPARISON:  Previous exam(s). ACR Breast Density Category c: The breasts are heterogeneously dense, which may obscure small masses. FINDINGS: Stable postlumpectomy changes in the left breast. No new suspicious mass, calcification, or other findings are identified in bilateral breasts. IMPRESSION: No evidence of malignancy. Stable postsurgical changes in the left breast. RECOMMENDATION: Per protocol, as the patient is now 2 or more years status post lumpectomy, she may return to annual screening mammography in 1 year. However, given the history of breast cancer, the patient remains eligible for annual diagnostic mammography if preferred. I have discussed the findings and recommendations with the patient. If applicable, a reminder letter will be sent to the patient regarding the next appointment. BI-RADS CATEGORY  2: Benign. Electronically Signed   By: Dirk Arrant M.D.   On: 05/03/2023 16:00     Assessment and plan- Patient is a 68 y.o. female with pathological prognostic stage Ib invasive mammary carcinoma triple negative pT1 cN0 M0 s/p lumpectomy and sentinel lymph node biopsy.  This was followed by adjuvant chemotherapy and radiation.  This is a routine follow-up visit for breast cancer  Patient completed adjuvant chemotherapy for triple negative breast cancer in September 2022.  Mammogram from January 2025 was unremarkable.  Clinically she is doing well  with no signs and symptoms of recurrence based on today's exam.  I will see her back in 6 months no labs   Visit Diagnosis 1. Encounter for follow-up surveillance of breast cancer      Dr. Annah Skene, MD, MPH [facility] at [facility] [phone] 05/12/2023 1:13 PM

## 2023-05-12 NOTE — Progress Notes (Signed)
 Patient here for oncology follow-up appointment, expresses no complaints or concerns at this time.

## 2023-08-31 ENCOUNTER — Ambulatory Visit
Admission: RE | Admit: 2023-08-31 | Discharge: 2023-08-31 | Disposition: A | Discharge status: Home / Self Care | Source: Ambulatory Visit | Attending: Family Medicine | Admitting: Family Medicine

## 2023-08-31 ENCOUNTER — Other Ambulatory Visit: Payer: Self-pay | Admitting: Family Medicine

## 2023-08-31 DIAGNOSIS — E538 Deficiency of other specified B group vitamins: Secondary | ICD-10-CM | POA: Insufficient documentation

## 2023-08-31 DIAGNOSIS — E559 Vitamin D deficiency, unspecified: Secondary | ICD-10-CM

## 2023-08-31 DIAGNOSIS — R221 Localized swelling, mass and lump, neck: Secondary | ICD-10-CM | POA: Diagnosis present

## 2023-08-31 DIAGNOSIS — M542 Cervicalgia: Secondary | ICD-10-CM | POA: Diagnosis present

## 2023-08-31 DIAGNOSIS — I10 Essential (primary) hypertension: Secondary | ICD-10-CM | POA: Insufficient documentation

## 2023-08-31 DIAGNOSIS — R569 Unspecified convulsions: Secondary | ICD-10-CM | POA: Insufficient documentation

## 2023-08-31 MED ORDER — IOHEXOL 300 MG/ML  SOLN
75.0000 mL | Freq: Once | INTRAMUSCULAR | Status: AC | PRN
  Administered 2023-08-31: 75 mL via INTRAVENOUS

## 2023-11-10 ENCOUNTER — Ambulatory Visit: Payer: Medicare Other | Admitting: Oncology

## 2023-11-15 ENCOUNTER — Inpatient Hospital Stay: Payer: Medicare Other | Attending: Oncology | Admitting: Oncology

## 2023-11-15 ENCOUNTER — Encounter: Payer: Self-pay | Admitting: Oncology

## 2023-11-15 VITALS — BP 116/71 | HR 85 | Temp 97.5°F | Resp 19 | Ht 67.0 in | Wt 190.6 lb

## 2023-11-15 DIAGNOSIS — Z08 Encounter for follow-up examination after completed treatment for malignant neoplasm: Secondary | ICD-10-CM | POA: Insufficient documentation

## 2023-11-15 DIAGNOSIS — Z85118 Personal history of other malignant neoplasm of bronchus and lung: Secondary | ICD-10-CM

## 2023-11-15 DIAGNOSIS — Z9221 Personal history of antineoplastic chemotherapy: Secondary | ICD-10-CM | POA: Diagnosis not present

## 2023-11-15 DIAGNOSIS — Z923 Personal history of irradiation: Secondary | ICD-10-CM | POA: Insufficient documentation

## 2023-11-15 DIAGNOSIS — Z171 Estrogen receptor negative status [ER-]: Secondary | ICD-10-CM

## 2023-11-15 DIAGNOSIS — M542 Cervicalgia: Secondary | ICD-10-CM | POA: Diagnosis not present

## 2023-11-15 DIAGNOSIS — Z801 Family history of malignant neoplasm of trachea, bronchus and lung: Secondary | ICD-10-CM | POA: Insufficient documentation

## 2023-11-15 DIAGNOSIS — Z803 Family history of malignant neoplasm of breast: Secondary | ICD-10-CM | POA: Diagnosis not present

## 2023-11-15 DIAGNOSIS — Z808 Family history of malignant neoplasm of other organs or systems: Secondary | ICD-10-CM | POA: Insufficient documentation

## 2023-11-15 DIAGNOSIS — C50312 Malignant neoplasm of lower-inner quadrant of left female breast: Secondary | ICD-10-CM | POA: Diagnosis not present

## 2023-11-15 DIAGNOSIS — Z853 Personal history of malignant neoplasm of breast: Secondary | ICD-10-CM | POA: Diagnosis not present

## 2023-11-15 NOTE — Progress Notes (Signed)
 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  This was followed by diagnostic mammogram and ultrasound which showed a 7 x 7 x 5 mm hypoechoic mass in the 7 o'clock position of the left breast 8 cm from the nipple.  Ultrasound of the left axilla demonstrates 1 normal-appearing left axillary lymph nodes including 1 with borderline diffuse cortical thickening measuring 3.8 mm in maximum thickness.  No  Lymph nodes with focal or eccentric cortical thickening were seen.  The suspicious breast mass was biopsied and was consistent with triple negative grade 3 invasive mammary carcinoma.   1 cm invasive mammary carcinoma that was grade 2 with negative margins.  1 sentinel lymph node was negative for malignancy.  Patient underwent lumpectomy with sentinel lymph node biopsy.  Final pathology showed 1.1 cm grade 2 invasive mammary carcinoma with negative margins.  1 sentinel lymph node was negative for malignancy.  PT1c  pN 0.     Patient completed adjuvant AC Taxol  chemotherapy on 12/10/2020.  She also completed adjuvant radiation treatment.      Interval history-patient wanted to see if she could come off antiseizure medications since she did not have seizures for a long time.  She did get breakthrough seizures after stopping her medications and therefore had to go back.  She has been having occasional pain in her neck when she moves it sideways.  Symptoms of chronic arthritis have been stable.  ECOG PS- 0 Pain scale- 3 Opioid associated constipation- no  Review of systems- Review of Systems  Constitutional:  Negative for chills, fever, malaise/fatigue and weight loss.  HENT:  Negative for congestion, ear discharge and nosebleeds.   Eyes:  Negative for blurred vision.  Respiratory:  Negative for cough, hemoptysis, sputum production, shortness of breath and wheezing.   Cardiovascular:  Negative for chest pain, palpitations, orthopnea and claudication.  Gastrointestinal:  Negative for abdominal pain, blood in stool, constipation, diarrhea, heartburn, melena, nausea and vomiting.  Genitourinary:  Negative for dysuria, flank pain, frequency, hematuria and urgency.  Musculoskeletal:  Negative for back pain, joint pain and myalgias.  Skin:  Negative for rash.  Neurological:  Negative for dizziness, tingling, focal weakness, seizures, weakness and headaches.  Endo/Heme/Allergies:  Does not bruise/bleed easily.  Psychiatric/Behavioral:  Negative for depression and suicidal ideas. The patient does not have insomnia.       Allergies  Allergen Reactions   Keppra [Levetiracetam]    Tegretol [Carbamazepine]      Past Medical History:  Diagnosis Date   Allergy    AR (allergic rhinitis)    Arthritis    Cancer (HCC) 06/2020   right breast [facility]   Family history of brain cancer    Family history of lung cancer  Hair loss    Hypertension    x 5years   Migraine    Personal history of chemotherapy    2022 breast CA   Personal history of radiation therapy    2022 breast CA   Psoriasis    Seizure (HCC)    x 17years   Seizure  disorder (HCC)    Vitamin D deficiency      Past Surgical History:  Procedure Laterality Date   BREAST BIOPSY Right    benign ? date-before 2006   BREAST BIOPSY Left 05/30/2020   us  bx, coil marker,  INVASIVE MAMMARY CARCINOMA, NO SPECIAL TYPE. Size of invasive carcinoma: 10 mm in this sample. Grade 3. Ductal carcinoma in situ: Not identified. Lymphovascular invasion: Not identified.   BREAST LUMPECTOMY Left    03/22 Invasive ductal CA  Neg margins   BREAST LUMPECTOMY WITH RADIOACTIVE SEED AND SENTINEL LYMPH NODE BIOPSY Left 06/24/2020   Procedure: LEFT BREAST LUMPECTOMY WITH RADIOACTIVE SEED AND LEFT AXILLARY SENTINEL LYMPH NODE BIOPSY;  Surgeon: Ebbie Cough, MD;  Location: [facility];  Service: General;  Laterality: Left;   CESAREAN SECTION     CHOLECYSTECTOMY     KNEE SURGERY     PORT-A-CATH REMOVAL Right 03/17/2021   Procedure: REMOVAL PORT-A-CATH;  Surgeon: Ebbie Cough, MD;  Location: [facility];  Service: General;  Laterality: Right;   PORTACATH PLACEMENT Right 06/24/2020   Procedure: INSERTION PORT-A-CATH;  Surgeon: Ebbie Cough, MD;  Location: [facility];  Service: General;  Laterality: Right;   TONSILLECTOMY     TUBAL LIGATION      Social History   Socioeconomic History   Marital status: Married    Spouse name: Acupuncturist   Number of children: 3   Years of education: College   Highest education level: Not on file  Occupational History   Occupation:      Employer: OTHER    Comment: [employer]  Tobacco Use   Smoking status: Never   Smokeless tobacco: Never  Vaping Use   Vaping status: Never Used  Substance and Sexual Activity   Alcohol use: Yes    Comment: Rarely- wine   Drug use: No   Sexual activity: Yes    Birth control/protection: Post-menopausal  Other Topics Concern   Not on file  Social History Narrative   Patient lives at home with her family.   Caffeine Use: 1 cups of  coffee daily   Social Drivers of Corporate investment banker Strain: Low Risk  (09/29/2023)   Received from [facility]   Overall Financial Resource Strain (CARDIA)    Difficulty of Paying Living Expenses: Not hard at all  Food Insecurity: No Food Insecurity (09/29/2023)   Received from [facility]   Hunger Vital Sign    Within the past 12 months, you worried that your food would run out before you got the money to buy more.: Never true    Within the past 12 months, the food you bought just didn't last and you didn't have money to get more.: Never true  Transportation Needs: No Transportation Needs (09/29/2023)   Received from [facility] - Transportation    In the past 12 months, has lack of transportation kept you from medical appointments or from getting medications?: No    Lack of Transportation (Non-Medical): No  Physical Activity: Not on file  Stress: Not on file  Social Connections: Not on  file  Intimate Partner Violence: Not on file    Family History  Problem Relation Age of Onset   Brain cancer Mother 11       tumor   Diabetes Father    Heart disease Father    Lung cancer Father    Cancer Paternal Aunt    Diabetes Maternal Grandmother    Stroke Maternal Grandfather    Breast cancer Neg Hx      Current Outpatient Medications:    Calcium 200 MG TABS, Take by mouth., Disp: , Rfl:    cetirizine (ZYRTEC) 5 MG tablet, , Disp: , Rfl:    cyanocobalamin (VITAMIN B12) 500 MCG tablet, Take 500 mcg by mouth daily., Disp: , Rfl:    cyclobenzaprine (FLEXERIL) 10 MG tablet, Take 10 mg by mouth 3 (three) times daily., Disp: , Rfl:    diclofenac Sodium (VOLTAREN) 1 % GEL, SMARTSIG:2 Gram(s) Topical 4 Times Daily PRN, Disp: , Rfl:    escitalopram (LEXAPRO) 10 MG tablet, Take 10 mg by mouth., Disp: , Rfl:    fluticasone (FLONASE) 50 MCG/ACT nasal spray, Place 2 sprays into both nostrils daily., Disp: , Rfl:     Gabapentin, Once-Daily, 600 MG TABS, Take 600 mg by mouth at bedtime., Disp: , Rfl:    ibuprofen (ADVIL) 800 MG tablet, Take 800 mg by mouth 3 (three) times daily as needed., Disp: , Rfl:    LAMICTAL  XR 200 MG TB24, Take 1 tablet (200 mg total) by mouth 2 (two) times daily. Brand Medically Necessary (Patient not taking: Reported on 11/09/2022), Disp: 180 tablet, Rfl: 3   LamoTRIgine  200 MG TB24 24 hour tablet, Take 1 tablet by mouth 2 (two) times daily., Disp: , Rfl:    lisinopril-hydrochlorothiazide (ZESTORETIC) 10-12.5 MG tablet, Take 1 tablet by mouth daily. Pt states 1/2 tablet a day, Disp: , Rfl:    Magnesium Gluconate (MAGNESIUM 27 PO), Take by mouth., Disp: , Rfl:    meloxicam (MOBIC) 15 MG tablet, Take 15 mg by mouth daily., Disp: , Rfl:    pantoprazole (PROTONIX) 40 MG tablet, Take 40 mg by mouth daily., Disp: , Rfl:    traMADol  (ULTRAM ) 50 MG tablet, Take 1 tablet (50 mg total) by mouth every 6 (six) hours as needed. (Patient not taking: Reported on 11/15/2023), Disp: 30 tablet, Rfl: 0  Physical exam:  Vitals:   11/15/23 1048  BP: 116/71  Pulse: 85  Resp: 19  Temp: (!) 97.5 F (36.4 C)  TempSrc: Tympanic  SpO2: 100%  Weight: 190 lb 9.6 oz (86.5 kg)  Height: 5' 7 (1.702 m)   Physical Exam Cardiovascular:     Rate and Rhythm: Normal rate and regular rhythm.     Heart sounds: Normal heart sounds.  Pulmonary:     Effort: Pulmonary effort is normal.     Breath sounds: Normal breath sounds.  Skin:    General: Skin is warm and dry.  Neurological:     Mental Status: She is alert and oriented to person, place, and time.    Breast exam was performed in seated and lying down position. Patient is status post left lumpectomy with a well-healed surgical scar. No evidence of any palpable masses. No evidence of axillary adenopathy. No evidence of any palpable masses or lumps in the right breast. No evidence of right axillary adenopathy   I have personally reviewed labs listed  below:    Latest Ref Rng & Units 03/11/2021   10:40 AM  CMP  Glucose 70 -  99 mg/dL 889   BUN 8 - 23 mg/dL 15   Creatinine 9.55 - 1.00 mg/dL 9.28   Sodium 864 - 854 mmol/L 139   Potassium 3.5 - 5.1 mmol/L 4.5   Chloride 98 - 111 mmol/L 101   CO2 22 - 32 mmol/L 28   Calcium 8.9 - 10.3 mg/dL 9.6   Total Protein 6.5 - 8.1 g/dL 7.1   Total Bilirubin 0.3 - 1.2 mg/dL 0.3   Alkaline Phos 38 - 126 U/L 90   AST 15 - 41 U/L 20   ALT 0 - 44 U/L 24       Latest Ref Rng & Units 03/11/2021   10:40 AM  CBC  WBC 4.0 - 10.5 K/uL 4.5   Hemoglobin 12.0 - 15.0 g/dL 86.7   Hematocrit 63.9 - 46.0 % 40.3   Platelets 150 - 400 K/uL 240      Assessment and plan- Patient is a 68 y.o. female with pathological prognostic stage Ib invasive mammary carcinoma triple negative pT1 cN0 M0 s/p lumpectomy and sentinel lymph node biopsy.  This was followed by adjuvant chemotherapy and radiation.  She is here for a routine surveillance visit for breast cancer  Mammogram from January 2025 was unremarkable.  Clinically she is doing well with no concerning signs and symptoms of recurrence based on today's exam.  We are now entering fourth year of surveillance.  I will see her back on a yearly basis at this time.  Symptoms of neck pain: Patient had a CT soft tissue neck in May 2025 which did not show any evidence of adenopathy.  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   Visit Diagnosis 1. Encounter for follow-up surveillance of lung cancer   2. Malignant neoplasm of lower-inner quadrant of left breast in female, estrogen receptor negative (HCC)      Dr. Annah Skene, MD, MPH [facility] at [facility] [phone] 11/15/2023 2:06 PM

## 2023-11-15 NOTE — Progress Notes (Signed)
 Patient has had 4 seizures within 6 months after trying to shy away from seizure medication. Neurologist has placed her back on medication. She is also having some back, chest, and neck pain that has been presenting several days a week. She is also have some issues with sleep, but currently not taking any medications to help with that.

## 2023-11-16 ENCOUNTER — Other Ambulatory Visit: Payer: Self-pay

## 2024-03-23 ENCOUNTER — Ambulatory Visit: Payer: Medicare Other | Admitting: Radiation Oncology

## 2024-05-03 ENCOUNTER — Ambulatory Visit
Admission: RE | Admit: 2024-05-03 | Discharge: 2024-05-03 | Disposition: A | Discharge status: Home / Self Care | Source: Ambulatory Visit | Attending: Oncology | Admitting: Oncology

## 2024-05-03 DIAGNOSIS — Z171 Estrogen receptor negative status [ER-]: Secondary | ICD-10-CM | POA: Diagnosis present

## 2024-05-03 DIAGNOSIS — C50312 Malignant neoplasm of lower-inner quadrant of left female breast: Secondary | ICD-10-CM | POA: Diagnosis present

## 2024-11-14 ENCOUNTER — Ambulatory Visit: Admitting: Oncology
